# Patient Record
Sex: Female | Born: 1995
Health system: Southern US, Community
[De-identification: ages and names within clinical notes are randomized; demographics above are authoritative.]

## PROBLEM LIST (undated history)

## (undated) ENCOUNTER — Ambulatory Visit (HOSPITAL_COMMUNITY): Disposition: A | Discharge status: Home / Self Care | Payer: Medicaid Other

## (undated) ENCOUNTER — Inpatient Hospital Stay (HOSPITAL_COMMUNITY): Payer: Self-pay

## (undated) DIAGNOSIS — E282 Polycystic ovarian syndrome: Secondary | ICD-10-CM

## (undated) DIAGNOSIS — G43909 Migraine, unspecified, not intractable, without status migrainosus: Secondary | ICD-10-CM

## (undated) DIAGNOSIS — D573 Sickle-cell trait: Secondary | ICD-10-CM

## (undated) DIAGNOSIS — R569 Unspecified convulsions: Secondary | ICD-10-CM

## (undated) HISTORY — PX: TONSILLECTOMY: SUR1361

## (undated) HISTORY — DX: Migraine, unspecified, not intractable, without status migrainosus: G43.909

---
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
## 2017-01-04 ENCOUNTER — Telehealth: Payer: Self-pay

## 2017-01-04 NOTE — Telephone Encounter (Signed)
Returned call and pt stated that she would like to come in for a blood draw pregnancy test. Advised that we would do a urine test but pt states that she took one at home that was negative and she has a history of PCOS and is currently on nexplanon. Asked pt why she thinks she may be pregnant, she states she has been very fatigue, nauseous, and crampy and would like to see a provider, transferred to scheduler.

## 2017-01-06 DIAGNOSIS — R110 Nausea: Secondary | ICD-10-CM | POA: Diagnosis not present

## 2017-01-06 DIAGNOSIS — N644 Mastodynia: Secondary | ICD-10-CM | POA: Diagnosis not present

## 2017-01-06 DIAGNOSIS — N911 Secondary amenorrhea: Secondary | ICD-10-CM | POA: Diagnosis not present

## 2017-01-06 DIAGNOSIS — Z79899 Other long term (current) drug therapy: Secondary | ICD-10-CM | POA: Diagnosis not present

## 2017-01-09 ENCOUNTER — Ambulatory Visit: Payer: Medicaid Other | Admitting: Certified Nurse Midwife

## 2017-04-02 ENCOUNTER — Encounter: Payer: Self-pay | Admitting: Obstetrics

## 2017-04-02 ENCOUNTER — Other Ambulatory Visit (HOSPITAL_COMMUNITY)
Admission: RE | Admit: 2017-04-02 | Discharge: 2017-04-02 | Disposition: A | Discharge status: Home / Self Care | Payer: Medicaid Other | Source: Ambulatory Visit | Attending: Certified Nurse Midwife | Admitting: Certified Nurse Midwife

## 2017-04-02 ENCOUNTER — Ambulatory Visit (INDEPENDENT_AMBULATORY_CARE_PROVIDER_SITE_OTHER): Payer: Medicaid Other | Admitting: Certified Nurse Midwife

## 2017-04-02 VITALS — BP 116/76 | HR 99 | Ht 60.0 in | Wt 154.2 lb

## 2017-04-02 DIAGNOSIS — Z978 Presence of other specified devices: Secondary | ICD-10-CM | POA: Diagnosis not present

## 2017-04-02 DIAGNOSIS — N946 Dysmenorrhea, unspecified: Secondary | ICD-10-CM

## 2017-04-02 DIAGNOSIS — E282 Polycystic ovarian syndrome: Secondary | ICD-10-CM | POA: Insufficient documentation

## 2017-04-02 DIAGNOSIS — N921 Excessive and frequent menstruation with irregular cycle: Secondary | ICD-10-CM

## 2017-04-02 DIAGNOSIS — Z113 Encounter for screening for infections with a predominantly sexual mode of transmission: Secondary | ICD-10-CM

## 2017-04-02 DIAGNOSIS — Z975 Presence of (intrauterine) contraceptive device: Secondary | ICD-10-CM

## 2017-04-02 DIAGNOSIS — B373 Candidiasis of vulva and vagina: Secondary | ICD-10-CM

## 2017-04-02 DIAGNOSIS — K2960 Other gastritis without bleeding: Secondary | ICD-10-CM | POA: Diagnosis not present

## 2017-04-02 DIAGNOSIS — B3731 Acute candidiasis of vulva and vagina: Secondary | ICD-10-CM

## 2017-04-02 MED ORDER — OMEPRAZOLE 20 MG PO CPDR: 20.0000 mg | DELAYED_RELEASE_CAPSULE | Freq: Two times a day (BID) | ORAL | 5 refills | Status: DC

## 2017-04-02 MED ORDER — PRENATE PIXIE 10-0.6-0.4-200 MG PO CAPS: 1.0000 | ORAL_CAPSULE | Freq: Every day | ORAL | 12 refills | Status: DC

## 2017-04-02 MED ORDER — FLUCONAZOLE 200 MG PO TABS: 200.0000 mg | ORAL_TABLET | Freq: Once | ORAL | 0 refills | Status: AC

## 2017-04-02 NOTE — Progress Notes (Signed)
Patient is in the office for follow up, complains of painful intercourse with a history of PCOS. Pt is currently on nexplanon, LMP 03-07-17.

## 2017-04-03 ENCOUNTER — Other Ambulatory Visit: Payer: Self-pay | Admitting: Certified Nurse Midwife

## 2017-04-03 ENCOUNTER — Encounter: Payer: Self-pay | Admitting: Gastroenterology

## 2017-04-03 ENCOUNTER — Encounter: Payer: Self-pay | Admitting: Certified Nurse Midwife

## 2017-04-03 LAB — CBC WITH DIFFERENTIAL/PLATELET
BASOS: 1 %
Basophils Absolute: 0 10*3/uL (ref 0.0–0.2)
EOS (ABSOLUTE): 0.1 10*3/uL (ref 0.0–0.4)
EOS: 1 %
HEMATOCRIT: 36.7 % (ref 34.0–46.6)
Hemoglobin: 11.7 g/dL (ref 11.1–15.9)
IMMATURE GRANS (ABS): 0 10*3/uL (ref 0.0–0.1)
Immature Granulocytes: 0 %
LYMPHS: 30 %
Lymphocytes Absolute: 2.1 10*3/uL (ref 0.7–3.1)
MCH: 21.7 pg — AB (ref 26.6–33.0)
MCHC: 31.9 g/dL (ref 31.5–35.7)
MCV: 68 fL — AB (ref 79–97)
MONOS ABS: 0.5 10*3/uL (ref 0.1–0.9)
Monocytes: 6 %
NEUTROS ABS: 4.4 10*3/uL (ref 1.4–7.0)
Neutrophils: 62 %
PLATELETS: 221 10*3/uL (ref 150–379)
RBC: 5.39 x10E6/uL — ABNORMAL HIGH (ref 3.77–5.28)
RDW: 16.6 % — ABNORMAL HIGH (ref 12.3–15.4)
WBC: 7.1 10*3/uL (ref 3.4–10.8)

## 2017-04-03 LAB — COMPREHENSIVE METABOLIC PANEL
ALT: 7 IU/L (ref 0–32)
AST: 13 IU/L (ref 0–40)
Albumin/Globulin Ratio: 1.6 (ref 1.2–2.2)
Albumin: 4.3 g/dL (ref 3.5–5.5)
Alkaline Phosphatase: 65 IU/L (ref 39–117)
BILIRUBIN TOTAL: 0.3 mg/dL (ref 0.0–1.2)
BUN / CREAT RATIO: 15 (ref 9–23)
BUN: 11 mg/dL (ref 6–20)
CALCIUM: 9.6 mg/dL (ref 8.7–10.2)
CO2: 23 mmol/L (ref 20–29)
Chloride: 101 mmol/L (ref 96–106)
Creatinine, Ser: 0.73 mg/dL (ref 0.57–1.00)
GFR calc Af Amer: 138 mL/min/{1.73_m2} (ref >59)
GFR, EST NON AFRICAN AMERICAN: 120 mL/min/{1.73_m2} (ref >59)
GLOBULIN, TOTAL: 2.7 g/dL (ref 1.5–4.5)
Glucose: 90 mg/dL (ref 65–99)
Potassium: 4.5 mmol/L (ref 3.5–5.2)
SODIUM: 137 mmol/L (ref 134–144)
TOTAL PROTEIN: 7 g/dL (ref 6.0–8.5)

## 2017-04-03 LAB — HIV ANTIBODY (ROUTINE TESTING W REFLEX): HIV SCREEN 4TH GENERATION: NONREACTIVE

## 2017-04-03 LAB — HEPATITIS C ANTIBODY: Hep C Virus Ab: <0.1 s/co ratio (ref 0.0–0.9)

## 2017-04-03 LAB — CERVICOVAGINAL ANCILLARY ONLY
Bacterial vaginitis: NEGATIVE
CANDIDA VAGINITIS: POSITIVE — AB
CHLAMYDIA, DNA PROBE: NEGATIVE
NEISSERIA GONORRHEA: NEGATIVE
TRICH (WINDOWPATH): NEGATIVE

## 2017-04-03 LAB — HEMOGLOBIN A1C
Est. average glucose Bld gHb Est-mCnc: 97 mg/dL
Hgb A1c MFr Bld: 5 % (ref 4.8–5.6)

## 2017-04-03 LAB — HEPATITIS B SURFACE ANTIGEN: Hepatitis B Surface Ag: NEGATIVE

## 2017-04-03 LAB — RPR: RPR Ser Ql: NONREACTIVE

## 2017-04-03 LAB — TSH: TSH: 1.16 u[IU]/mL (ref 0.450–4.500)

## 2017-04-03 NOTE — Progress Notes (Signed)
Patient ID: Bailey Rose, female   DOB: 1995-11-28, 21 y.o.   MRN: [id number]  Chief Complaint  Patient presents with  . GYN    HPI Bailey Rose is a 21 y.o. female.  Here for f/u on PCOS, had annual exam on 10/02/16.  States that she is having pain with sexual intercourse.  Has Nexplanon that was placed: 03/23/15.  Has a new sexual partner this year.  Desires STD screening examination.  Reports change in vaginal discharge.  Denies any change in medications or activity.  Six months ago her labs were normal, STD blood work was not done.  Will do that today per patient request.  She did have BV and was anemic six months ago.  Reports pain with penetration during sexual intercourse, bimanual exam was negative.  Discussed changing positions and using lubrication along with foreplay.  She also states that she has had a change in her bowel movements/reflux symptoms that started recently.  Has not tried anything for this problem and has not seen anyone for this problem, is requesting a GI consult.  Negative hx for cancers in the family.  Denies drinking/smoking.  Reports irregular periods with Nexplanon, does not want to change birth control methods.  Discussed maybe using Nuva Ring if other methods are not working for pain with sexual intercourse or if labs are negative. Does report dysmenorrhea and occasional cramping still with Nexplanon, has not changed since she started having periods.   HPI  Past Medical History:  Diagnosis Date  . Asthma   . Migraines   . Sickle cell trait (HCC)   . Sickle cell trait [facility])     Past Surgical History:  Procedure Laterality Date  . TONSILLECTOMY    . WISDOM TOOTH EXTRACTION  2017    Family History  Problem Relation Age of Onset  . Sickle cell anemia Mother   . Cancer Maternal Grandmother   . Migraines Maternal Grandmother     Social History Social History  Substance Use Topics  . Smoking status: Never Smoker  . Smokeless tobacco: Never Used  .  Alcohol use No    No Known Allergies  Current Outpatient Prescriptions  Medication Sig Dispense Refill  . omeprazole (PRILOSEC) 20 MG capsule Take 1 capsule (20 mg total) by mouth 2 (two) times daily before a meal. 60 capsule 5  . Prenat-FeAsp-Meth-FA-DHA w/o A (PRENATE PIXIE) 10-0.6-0.4-200 MG CAPS Take 1 tablet by mouth daily. 30 capsule 12   No current facility-administered medications for this visit.     Review of Systems Review of Systems Constitutional: negative for fatigue and weight loss Respiratory: negative for cough and wheezing Cardiovascular: negative for chest pain, fatigue and palpitations Gastrointestinal: negative for abdominal pain and + in bowel habits, +reflux Genitourinary: +pain with sexual intercourse, Hx of PCOS Integument/breast: negative for nipple discharge Musculoskeletal:negative for myalgias Neurological: negative for gait problems and tremors Behavioral/Psych: negative for abusive relationship, depression Endocrine: negative for temperature intolerance      Blood pressure 116/76, pulse 99, height 5' (1.524 m), weight 154 lb 3.2 oz (69.9 kg), last menstrual period 03/07/2017.  Physical Exam Physical Exam General:   alert  Skin:   no rash or abnormalities  Lungs:   clear to auscultation bilaterally  Heart:   regular rate and rhythm, S1, S2 normal, no murmur, click, rub or gallop  Breasts:   deferred  Abdomen:  normal findings: no organomegaly, soft, non-tender and no hernia  Pelvis:  External genitalia: normal general appearance Urinary system:  urethral meatus normal and bladder without fullness, nontender Vaginal: normal without tenderness, induration or masses, + white chunky vaginal discharge Cervix: no CMT Adnexa: normal bimanual exam Uterus: anteverted and non-tender, normal size    50% of 20 min visit spent on counseling and coordination of care.    Data Reviewed Previous medical hx, meds, labs  Assessment     Hx of PCOS Pain with  sexual intercourse: new problem Reflux: GI symptoms new problem STD screening exam; high risk sexual behaviors Hx of Anemia: started on vitamins Vaginitis    Plan    Orders Placed This Encounter  Procedures  . CBC with Differential/Platelet  . Comprehensive metabolic panel  . TSH  . Hemoglobin A1c  . HIV antibody (with reflex)  . RPR  . Hepatitis C antibody  . Hepatitis B surface antigen  . Ambulatory referral to Gastroenterology    Referral Priority:   Routine    Referral Type:   Consultation    Referral Reason:   Specialty Services Required    Number of Visits Requested:   1   Meds ordered this encounter  Medications  . Prenat-FeAsp-Meth-FA-DHA w/o A (PRENATE PIXIE) 10-0.6-0.4-200 MG CAPS    Sig: Take 1 tablet by mouth daily.    Dispense:  30 capsule    Refill:  12    Please process coupon: Rx BIN: [id number], RxPCN: [id number], RxGRP: [id number], RxID: [id number]  SUF: 01  . omeprazole (PRILOSEC) 20 MG capsule    Sig: Take 1 capsule (20 mg total) by mouth 2 (two) times daily before a meal.    Dispense:  60 capsule    Refill:  5  . fluconazole (DIFLUCAN) 200 MG tablet    Sig: Take 1 tablet (200 mg total) by mouth once. Repeat dose in 48-72 hours.    Dispense:  3 tablet    Refill:  0    Follow up as needed.

## 2017-04-04 ENCOUNTER — Other Ambulatory Visit: Payer: Self-pay | Admitting: Certified Nurse Midwife

## 2017-04-04 ENCOUNTER — Encounter: Payer: Self-pay | Admitting: *Deleted

## 2017-04-04 DIAGNOSIS — B3731 Acute candidiasis of vulva and vagina: Secondary | ICD-10-CM

## 2017-04-04 DIAGNOSIS — B373 Candidiasis of vulva and vagina: Secondary | ICD-10-CM

## 2017-04-04 MED ORDER — TERCONAZOLE 0.8 % VA CREA: 1.0000 | TOPICAL_CREAM | Freq: Every day | VAGINAL | 0 refills | Status: DC

## 2017-04-04 MED ORDER — FLUCONAZOLE 200 MG PO TABS: 200.0000 mg | ORAL_TABLET | Freq: Once | ORAL | 0 refills | Status: AC

## 2017-05-09 ENCOUNTER — Ambulatory Visit (INDEPENDENT_AMBULATORY_CARE_PROVIDER_SITE_OTHER): Payer: Self-pay | Admitting: Specialist

## 2017-05-14 ENCOUNTER — Ambulatory Visit (INDEPENDENT_AMBULATORY_CARE_PROVIDER_SITE_OTHER): Payer: Self-pay | Admitting: Specialist

## 2017-05-15 ENCOUNTER — Ambulatory Visit (INDEPENDENT_AMBULATORY_CARE_PROVIDER_SITE_OTHER): Payer: Self-pay | Admitting: Specialist

## 2017-05-24 ENCOUNTER — Ambulatory Visit: Payer: Medicaid Other | Admitting: Gastroenterology

## 2017-05-26 ENCOUNTER — Inpatient Hospital Stay (HOSPITAL_COMMUNITY)
Admission: AD | Admit: 2017-05-26 | Discharge: 2017-05-26 | Disposition: A | Discharge status: Home / Self Care | Payer: Medicaid Other | Source: Ambulatory Visit | Attending: Obstetrics & Gynecology | Admitting: Obstetrics & Gynecology

## 2017-05-26 DIAGNOSIS — Z711 Person with feared health complaint in whom no diagnosis is made: Secondary | ICD-10-CM

## 2017-05-26 NOTE — MAU Provider Note (Signed)
S; Pt in demanding her nexplanon be removed. States messing with her body. But could not give explanation as to how. States had it in for two years. O: vss, alert and oriented x 3, no acute distress A; stable 21 yo female in no acute distress P: lengthy discussion with pt that she would need provider that put in her nexplanon to remove it. Explained to her  That this was not a emergency and we tipically did not remove them in the ER. She verbalized understanding and was d/c home

## 2017-05-26 NOTE — MAU Note (Signed)
Pt reports to MAU stating that she wants her nexplanon removed, D Lawson CNM in discussing plan of care with pt

## 2017-05-28 ENCOUNTER — Telehealth: Payer: Self-pay

## 2017-05-28 NOTE — Telephone Encounter (Signed)
Pt called on Friday and left vm. Returned pt call this am. LM to call back

## 2017-06-13 ENCOUNTER — Ambulatory Visit: Payer: Medicaid Other | Admitting: Certified Nurse Midwife

## 2017-06-22 ENCOUNTER — Ambulatory Visit (INDEPENDENT_AMBULATORY_CARE_PROVIDER_SITE_OTHER): Payer: Self-pay | Admitting: Specialist

## 2017-07-04 ENCOUNTER — Ambulatory Visit (INDEPENDENT_AMBULATORY_CARE_PROVIDER_SITE_OTHER): Payer: Medicaid Other | Admitting: Certified Nurse Midwife

## 2017-07-04 ENCOUNTER — Other Ambulatory Visit (HOSPITAL_COMMUNITY)
Admission: RE | Admit: 2017-07-04 | Discharge: 2017-07-04 | Disposition: A | Discharge status: Home / Self Care | Payer: Medicaid Other | Source: Ambulatory Visit | Attending: Certified Nurse Midwife | Admitting: Certified Nurse Midwife

## 2017-07-04 ENCOUNTER — Encounter: Payer: Self-pay | Admitting: Certified Nurse Midwife

## 2017-07-04 VITALS — BP 102/66 | HR 91 | Ht 61.0 in | Wt 156.0 lb

## 2017-07-04 DIAGNOSIS — Z01419 Encounter for gynecological examination (general) (routine) without abnormal findings: Secondary | ICD-10-CM | POA: Diagnosis not present

## 2017-07-04 DIAGNOSIS — N921 Excessive and frequent menstruation with irregular cycle: Secondary | ICD-10-CM | POA: Insufficient documentation

## 2017-07-04 DIAGNOSIS — Z113 Encounter for screening for infections with a predominantly sexual mode of transmission: Secondary | ICD-10-CM

## 2017-07-04 DIAGNOSIS — E282 Polycystic ovarian syndrome: Secondary | ICD-10-CM

## 2017-07-04 DIAGNOSIS — Z9689 Presence of other specified functional implants: Secondary | ICD-10-CM | POA: Insufficient documentation

## 2017-07-04 DIAGNOSIS — Z0000 Encounter for general adult medical examination without abnormal findings: Secondary | ICD-10-CM

## 2017-07-04 DIAGNOSIS — Z975 Presence of (intrauterine) contraceptive device: Secondary | ICD-10-CM

## 2017-07-04 MED ORDER — NORELGESTROMIN-ETH ESTRADIOL 150-35 MCG/24HR TD PTWK: 1.0000 | MEDICATED_PATCH | TRANSDERMAL | 12 refills | Status: DC

## 2017-07-04 NOTE — Progress Notes (Signed)
Presents for AUB with Nexplanon.

## 2017-07-04 NOTE — Progress Notes (Signed)
Subjective:        Bailey Rose is a 21 y.o. female here for a routine exam.  Current complaints: irregular bleeding with Nexplanon.   Had Nexplanon that was placed: 03/23/15.  Has a new sexual partner this year.  Desires STD screening examination.  Reports change in vaginal discharge.  Denies any change in medications or activity.  Six months ago her labs were normal, STD blood work was not done.  Will do that today per patient request.  She did have BV and was anemic six months ago.  Reports pain with penetration during sexual intercourse, bimanual exam was negative.  Discussed changing positions and using lubrication along with foreplay.  She also states that she has had a change in her bowel movements/reflux symptoms that started recently.  Has not tried anything for this problem and has not seen anyone for this problem, is requesting a GI consult.  Negative hx for cancers in the family.  Denies drinking/smoking.  Reports irregular periods with Nexplanon, does not want to change birth control methods.  Discussed maybe using Nuva Ring if other methods are not working for pain with sexual intercourse or if labs are negative. Does report dysmenorrhea and occasional cramping still with Nexplanon, has not changed since she started having periods. Reports bleeding with Nexplanon several times per month.    Personal health questionnaire:  Is patient [name], have a family history of breast and/or ovarian cancer: no Is there a family history of uterine cancer diagnosed at age < 6950, gastrointestinal cancer, urinary tract cancer, family member who is a Personnel officerLynch syndrome-associated carrier: no Is the patient overweight and hypertensive, family history of diabetes, personal history of gestational diabetes, preeclampsia or PCOS: no Is patient over 5655, have PCOS,  family history of premature CHD under age 19, diabetes, smoke, have hypertension or peripheral artery disease:  no At any time, has a partner  hit, kicked or otherwise hurt or frightened you?: no Over the past 2 weeks, have you felt down, depressed or hopeless?: no Over the past 2 weeks, have you felt little interest or pleasure in doing things?:no   Gynecologic History No LMP recorded. Patient has had an implant. Contraception: Nexplanon Last Pap: n/a <21.  Last mammogram: n/a <40.   Obstetric History OB History  Gravida Para Term Preterm AB Living  0 0 0 0 0 0  SAB TAB Ectopic Multiple Live Births  0 0 0 0 0        Past Medical History:  Diagnosis Date  . Asthma   . Migraines   . Sickle cell trait (HCC)   . Sickle cell trait [facility](HCC)     Past Surgical History:  Procedure Laterality Date  . TONSILLECTOMY    . WISDOM TOOTH EXTRACTION  2017     Current Outpatient Medications:  .  etonogestrel (NEXPLANON) 68 MG IMPL implant, 1 each by Subdermal route once., Disp: , Rfl:  .  omeprazole (PRILOSEC) 20 MG capsule, Take 1 capsule (20 mg total) by mouth 2 (two) times daily before a meal. (Patient not taking: Reported on 07/04/2017), Disp: 60 capsule, Rfl: 5 .  Prenat-FeAsp-Meth-FA-DHA w/o A (PRENATE PIXIE) 10-0.6-0.4-200 MG CAPS, Take 1 tablet by mouth daily. (Patient not taking: Reported on 07/04/2017), Disp: 30 capsule, Rfl: 12 .  terconazole (TERAZOL 3) 0.8 % vaginal cream, Place 1 applicator vaginally at bedtime. (Patient not taking: Reported on 07/04/2017), Disp: 20 g, Rfl: 0 No Known Allergies  Social History   Tobacco Use  .  Smoking status: Never Smoker  . Smokeless tobacco: Never Used  Substance Use Topics  . Alcohol use: No    Alcohol/week: 0.0 oz    Family History  Problem Relation Age of Onset  . Sickle cell anemia Mother   . Cancer Maternal Grandmother   . Migraines Maternal Grandmother       Review of Systems  Constitutional: negative for fatigue and weight loss Respiratory: negative for cough and wheezing Cardiovascular: negative for chest pain, fatigue and palpitations Gastrointestinal:  negative for abdominal pain and change in bowel habits Musculoskeletal:negative for myalgias Neurological: negative for gait problems and tremors Behavioral/Psych: negative for abusive relationship, depression Endocrine: negative for temperature intolerance    Genitourinary:negative for abnormal menstrual periods, genital lesions, hot flashes, sexual problems and vaginal discharge Integument/breast: negative for breast lump, breast tenderness, nipple discharge and skin lesion(s)    Objective:       BP 102/66   Pulse 91   Ht 5\' 1"  (1.549 m)   Wt 156 lb (70.8 kg)   BMI 29.48 kg/m  General:   alert  Skin:   no rash or abnormalities  Lungs:   clear to auscultation bilaterally  Heart:   regular rate and rhythm, S1, S2 normal, no murmur, click, rub or gallop  Breasts:   normal without suspicious masses, skin or nipple changes or axillary nodes  Abdomen:  normal findings: no organomegaly, soft, non-tender and no hernia  Pelvis:  External genitalia: normal general appearance Urinary system: urethral meatus normal and bladder without fullness, nontender Vaginal: normal without tenderness, induration or masses Cervix: normal appearance Adnexa: normal bimanual exam Uterus: anteverted and non-tender, normal size   Lab Review Urine pregnancy test Labs reviewed yes Radiologic studies reviewed no  50% of 30 min visit spent on counseling and coordination of care.   Assessment & Plan    Healthy female exam.    1. Screen for STD (sexually transmitted disease)    - RPR - Hepatitis C antibody - Hepatitis B surface antigen - HIV antibody - Cervicovaginal ancillary only  2. Breakthrough bleeding on Nexplanon    - CBC with Differential/Platelet - Comprehensive metabolic panel - TSH - Cervicovaginal ancillary only  3. Well woman exam     4. PCOS (polycystic ovarian syndrome)    - Hemoglobin A1c - norelgestromin-ethinyl estradiol (XULANE) 150-35 MCG/24HR transdermal patch; Place  1 patch onto the skin once a week.  Dispense: 3 patch; Refill: 12   Education reviewed: calcium supplements, depression evaluation, low fat, low cholesterol diet, safe sex/STD prevention, self breast exams, skin cancer screening and weight bearing exercise. Contraception: Ortho-Evra patches weekly and Nexplanon. Follow up in: 2 weeks for Nexplanon removal.

## 2017-07-04 NOTE — Progress Notes (Signed)
Pt states that she has been having breakthrough bleeding 2-3 times a month, and has had nexplanon since 03-23-15. Pt states that she wants to discuss options with provider.

## 2017-07-05 LAB — CERVICOVAGINAL ANCILLARY ONLY
Bacterial vaginitis: NEGATIVE
CANDIDA VAGINITIS: NEGATIVE
Chlamydia: NEGATIVE
Neisseria Gonorrhea: NEGATIVE
TRICH (WINDOWPATH): NEGATIVE

## 2017-07-05 LAB — COMPREHENSIVE METABOLIC PANEL
A/G RATIO: 1.8 (ref 1.2–2.2)
ALT: 7 IU/L (ref 0–32)
AST: 11 IU/L (ref 0–40)
Albumin: 4.6 g/dL (ref 3.5–5.5)
Alkaline Phosphatase: 62 IU/L (ref 39–117)
BILIRUBIN TOTAL: 0.3 mg/dL (ref 0.0–1.2)
BUN / CREAT RATIO: 18 (ref 9–23)
BUN: 13 mg/dL (ref 6–20)
CHLORIDE: 104 mmol/L (ref 96–106)
CO2: 23 mmol/L (ref 20–29)
Calcium: 9.6 mg/dL (ref 8.7–10.2)
Creatinine, Ser: 0.74 mg/dL (ref 0.57–1.00)
GFR, EST AFRICAN AMERICAN: 136 mL/min/{1.73_m2} (ref >59)
GFR, EST NON AFRICAN AMERICAN: 118 mL/min/{1.73_m2} (ref >59)
GLOBULIN, TOTAL: 2.6 g/dL (ref 1.5–4.5)
Glucose: 75 mg/dL (ref 65–99)
POTASSIUM: 4.3 mmol/L (ref 3.5–5.2)
SODIUM: 141 mmol/L (ref 134–144)
TOTAL PROTEIN: 7.2 g/dL (ref 6.0–8.5)

## 2017-07-05 LAB — HEMOGLOBIN A1C
ESTIMATED AVERAGE GLUCOSE: 97 mg/dL
HEMOGLOBIN A1C: 5 % (ref 4.8–5.6)

## 2017-07-05 LAB — CBC WITH DIFFERENTIAL/PLATELET
BASOS: 1 %
Basophils Absolute: 0.1 10*3/uL (ref 0.0–0.2)
EOS (ABSOLUTE): 0.2 10*3/uL (ref 0.0–0.4)
EOS: 2 %
HEMATOCRIT: 38.2 % (ref 34.0–46.6)
Hemoglobin: 12.6 g/dL (ref 11.1–15.9)
IMMATURE GRANS (ABS): 0 10*3/uL (ref 0.0–0.1)
Immature Granulocytes: 0 %
Lymphocytes Absolute: 2.7 10*3/uL (ref 0.7–3.1)
Lymphs: 36 %
MCH: 23.1 pg — AB (ref 26.6–33.0)
MCHC: 33 g/dL (ref 31.5–35.7)
MCV: 70 fL — AB (ref 79–97)
MONOS ABS: 0.5 10*3/uL (ref 0.1–0.9)
Monocytes: 7 %
NEUTROS ABS: 4.1 10*3/uL (ref 1.4–7.0)
Neutrophils: 54 %
PLATELETS: 248 10*3/uL (ref 150–379)
RBC: 5.46 x10E6/uL — ABNORMAL HIGH (ref 3.77–5.28)
RDW: 16.9 % — ABNORMAL HIGH (ref 12.3–15.4)
WBC: 7.6 10*3/uL (ref 3.4–10.8)

## 2017-07-05 LAB — HEPATITIS C ANTIBODY

## 2017-07-05 LAB — TSH: TSH: 0.648 u[IU]/mL (ref 0.450–4.500)

## 2017-07-05 LAB — RPR: RPR Ser Ql: NONREACTIVE

## 2017-07-05 LAB — HEPATITIS B SURFACE ANTIGEN: HEP B S AG: NEGATIVE

## 2017-07-05 LAB — HIV ANTIBODY (ROUTINE TESTING W REFLEX): HIV Screen 4th Generation wRfx: NONREACTIVE

## 2017-07-18 ENCOUNTER — Telehealth: Payer: Self-pay

## 2017-07-18 NOTE — Telephone Encounter (Signed)
Returned call and pt had questions about labs and iron levels.

## 2017-07-19 ENCOUNTER — Ambulatory Visit: Payer: Medicaid Other | Admitting: Gastroenterology

## 2017-07-24 ENCOUNTER — Encounter: Payer: Self-pay | Admitting: Certified Nurse Midwife

## 2017-07-24 ENCOUNTER — Ambulatory Visit (INDEPENDENT_AMBULATORY_CARE_PROVIDER_SITE_OTHER): Payer: Medicaid Other | Admitting: Certified Nurse Midwife

## 2017-07-24 VITALS — BP 113/69 | HR 74 | Wt 153.0 lb

## 2017-07-24 DIAGNOSIS — Z3049 Encounter for surveillance of other contraceptives: Secondary | ICD-10-CM | POA: Diagnosis not present

## 2017-07-24 DIAGNOSIS — Z3046 Encounter for surveillance of implantable subdermal contraceptive: Secondary | ICD-10-CM

## 2017-07-24 NOTE — Progress Notes (Signed)
Pt here for a Nexplanon Removal

## 2017-07-24 NOTE — Progress Notes (Signed)
Procedure Note Removal of Nexplanon  Patient had Nexplanon inserted in 03/23/15. Desires removal today due to irregular periods, sometimes twice monthly with heavy bleeding reported.   Reviewed risk and benefits of procedure. Alternative options discussed Patient reported understanding and agreed to continue.   The patient's left arm was palpated and the implant device located. The area was prepped with Betadinex3. The distal end of the device was palpated and 1 cc of 1% lidocaine with epinephrine was injected. A 2 mm incision was made. Any fibrotic tissue was carefully dissected away using blunt and/or sharp dissection. Over the tip and the tip was exposed, grasped with forcep and removed intact. Steri-strips and a sterile dressing were applied to the incision.   And a bandage applied and the arm was wrapped with gauze bandage.  The patient tolerated well.  Instructions:  The patient was instructed to remove the dressing in 24 hours and that some bruising is to be expected.  She was advised to use over the counter analgesics as needed for any pain at the site.  She is to keep the area dry for 24 hours and to call if her hand or arm becomes cold, numb, or blue.  Return visit:  Return in 6+ weeks Patient plans Ortho-Evra Patches & abstinence.   Bailey Rose CNM

## 2017-07-25 ENCOUNTER — Ambulatory Visit (HOSPITAL_COMMUNITY)
Admission: EM | Admit: 2017-07-25 | Discharge: 2017-07-25 | Disposition: A | Discharge status: Home / Self Care | Payer: Medicaid Other | Attending: Urgent Care | Admitting: Urgent Care

## 2017-07-25 ENCOUNTER — Other Ambulatory Visit: Payer: Self-pay

## 2017-07-25 ENCOUNTER — Encounter (HOSPITAL_COMMUNITY): Payer: Self-pay | Admitting: Emergency Medicine

## 2017-07-25 DIAGNOSIS — R0602 Shortness of breath: Secondary | ICD-10-CM

## 2017-07-25 DIAGNOSIS — R0789 Other chest pain: Secondary | ICD-10-CM

## 2017-07-25 MED ORDER — FLUTICASONE PROPIONATE 50 MCG/ACT NA SUSP: 2.0000 | Freq: Every day | NASAL | 11 refills | Status: DC

## 2017-07-25 MED ORDER — ALBUTEROL SULFATE HFA 108 (90 BASE) MCG/ACT IN AERS: 1.0000 | INHALATION_SPRAY | RESPIRATORY_TRACT | 0 refills | Status: DC | PRN

## 2017-07-25 MED ORDER — CETIRIZINE HCL 10 MG PO TABS: 10.0000 mg | ORAL_TABLET | Freq: Every day | ORAL | 11 refills | Status: DC

## 2017-07-25 NOTE — ED Triage Notes (Signed)
Pt states she has mold in her apartment complex

## 2017-07-25 NOTE — ED Provider Notes (Signed)
  MRN: [id number] DOB: 02/21/1996  Subjective:   Bailey Rose is a 22 y.o. female presenting for 4 day history of intermittent shortness of breath daily. Also has chest tightness, chest pain, mild occasional dry cough. She recently quit smoking cigars as recommended by her PCP. However, there is a mold issue in her apartment complex. Denies fever, sore throat, wheezing, n/v, abdominal pain, rashes, dizziness, confusion.    Bailey Rose has No Known Allergies.  Bailey Rose  has a past medical history of Asthma, Migraines, Sickle cell trait (HCC), and Sickle cell trait (HCC). Also  has a past surgical history that includes Tonsillectomy and Wisdom tooth extraction (2017). Her family history includes Cancer in her maternal grandmother; Migraines in her maternal grandmother; Sickle cell anemia in her mother.   Objective:   Vitals: BP 122/69 (BP Location: Right Arm)   Pulse 87   Temp 98.4 F (36.9 C) (Oral)   Resp 16   LMP 07/16/2017   SpO2 100%   Physical Exam  Constitutional: She is oriented to person, place, and time. She appears well-developed and well-nourished.  HENT:  Mouth/Throat: Oropharynx is clear and moist.  Cardiovascular: Normal rate, regular rhythm and intact distal pulses. Exam reveals no gallop and no friction rub.  No murmur heard. Pulmonary/Chest: No respiratory distress. She has no wheezes. She has no rales.  Neurological: She is alert and oriented to person, place, and time.  Skin: Skin is warm and dry.  Psychiatric: She has a normal mood and affect.   Assessment and Plan :   Shortness of breath  Chest tightness  Atypical chest pain   Patient has a history of asthma, exposure to mold is likely eliciting shob, chest symptoms. Recommended patient start albuterol inhaler, zyrtec and Flonase. Return-to-clinic precautions discussed, patient verbalized understanding. Otherwise, follow up with PCP.   Wallis BambergMani, Vance Belcourt, PA-C 07/25/17 1038

## 2017-07-25 NOTE — ED Triage Notes (Signed)
Pt c/o trouble breathing, pressure on her chest for the last few days. Resp e/u. Pt smokes cigars, her PCP told her if nothing got better after quitting smoking to come here.

## 2017-07-28 ENCOUNTER — Encounter (HOSPITAL_COMMUNITY): Payer: Self-pay

## 2017-07-28 ENCOUNTER — Other Ambulatory Visit: Payer: Self-pay

## 2017-07-28 ENCOUNTER — Emergency Department (HOSPITAL_COMMUNITY)
Admission: EM | Admit: 2017-07-28 | Discharge: 2017-07-28 | Disposition: A | Discharge status: Home / Self Care | Payer: Medicaid Other | Attending: Emergency Medicine | Admitting: Emergency Medicine

## 2017-07-28 DIAGNOSIS — M545 Low back pain, unspecified: Secondary | ICD-10-CM

## 2017-07-28 DIAGNOSIS — J45909 Unspecified asthma, uncomplicated: Secondary | ICD-10-CM | POA: Diagnosis not present

## 2017-07-28 DIAGNOSIS — Z79899 Other long term (current) drug therapy: Secondary | ICD-10-CM | POA: Insufficient documentation

## 2017-07-28 DIAGNOSIS — D573 Sickle-cell trait: Secondary | ICD-10-CM | POA: Diagnosis not present

## 2017-07-28 MED ORDER — LIDOCAINE 5 % EX PTCH: 1.0000 | MEDICATED_PATCH | CUTANEOUS | 0 refills | Status: DC

## 2017-07-28 MED ORDER — METHOCARBAMOL 500 MG PO TABS: 500.0000 mg | ORAL_TABLET | Freq: Three times a day (TID) | ORAL | 0 refills | Status: DC | PRN

## 2017-07-28 MED ORDER — NAPROXEN 250 MG PO TABS
500.0000 mg | ORAL_TABLET | Freq: Once | ORAL | Status: AC
  Administered 2017-07-28: 500 mg via ORAL
  Filled 2017-07-28: qty 2

## 2017-07-28 MED ORDER — NAPROXEN 500 MG PO TABS: 500.0000 mg | ORAL_TABLET | Freq: Two times a day (BID) | ORAL | 0 refills | Status: DC

## 2017-07-28 NOTE — ED Triage Notes (Signed)
Onset 4 days back pain, h/o of back pain that started over 1 year ago while in [employer].

## 2017-07-28 NOTE — ED Provider Notes (Signed)
[facility] EMERGENCY DEPARTMENT Provider Note   CSN: [id number] Arrival date & time: 07/28/17  2130     History   Chief Complaint Chief Complaint  Patient presents with  . Back Pain    HPI Bailey Rose is a 22 y.o. female with a hx of asthma and migraines presents to the ED with complaint of acute on chronic lower back pain for the past 4 days. 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 States that she has been doing a lot of standing at work which she believes worsened the pain, no specific injury, fall, or heavy lifting. Describes the pain as being an aching/throbbing pain to the diffuse lower back. States pain sometimes feels like a muscle spasm. Rates pain a 7.5/10 in severity. States pain is better in the fetal position, worse with movement and standing. Pain is non radiating. Has tried ibuprofen and tylenol without significant relief. States that muscle relaxers have helped previously with flares. States pain is similar to previous, same character/location/severity. Has tried going to the chiropractor and PT without significant change over the past 1 year. Additionally have some discomfort to neck over past few days which she has had previously, states it feels stiff. No injury.  Denies numbness, tingling, weakness, incontinence to bowel/bladder, saddle anesthesia, fever, chills, IV drug use, or hx of cancer.   HPI  Past Medical History:  Diagnosis Date  . Asthma   . Migraines   . Sickle cell trait (HCC)   . Sickle cell trait [facility])     Patient Active Problem List   Diagnosis Date Noted  . Encounter for Nexplanon removal 07/24/2017  . Breakthrough bleeding on Nexplanon 08/07/2015  . PCOS (polycystic ovarian syndrome) 03/18/2015  . Acne cystica 02/10/2015  . Dysmenorrhea 06/24/2013     Past Surgical History:  Procedure Laterality Date  . TONSILLECTOMY    . WISDOM TOOTH EXTRACTION  2017    OB History    Gravida Para Term Preterm AB Living   0 0 0 0 0 0   SAB TAB Ectopic Multiple Live Births   0 0 0 0 0       Home Medications    Prior to Admission medications   Medication Sig Start Date End Date Taking? Authorizing Provider  albuterol (PROVENTIL HFA;VENTOLIN HFA) 108 (90 Base) MCG/ACT inhaler Inhale 1-2 puffs into the lungs every 4 (four) hours as needed for wheezing or shortness of breath. 07/25/17   Wallis Bamberg, PA-C  cetirizine (ZYRTEC ALLERGY) 10 MG tablet Take 1 tablet (10 mg total) by mouth daily. 07/25/17   Wallis Bamberg, PA-C  etonogestrel (NEXPLANON) 68 MG IMPL implant 1 each by Subdermal route once.    [provider]  fluticasone (FLONASE) 50 MCG/ACT nasal spray Place 2 sprays into both nostrils daily. 07/25/17   Wallis Bamberg, PA-C  omeprazole (PRILOSEC) 20 MG capsule Take 1 capsule (20 mg total) by mouth 2 (two) times daily before a meal. 04/02/17   Denney, Rodell Perna, CNM    Family History Family History  Problem Relation Age of Onset  . Sickle cell anemia Mother   . Cancer Maternal Grandmother   . Migraines Maternal Grandmother     Social History Social History   Tobacco Use  . Smoking status: Never Smoker  . Smokeless tobacco: Never Used  Substance Use Topics  . Alcohol use: No    Alcohol/week: 0.0 oz  . Drug use: No     Allergies   Patient has no known allergies.   Review of Systems Review of Systems  Constitutional: Negative for chills, fever and unexpected weight change.  Gastrointestinal: Negative for abdominal pain.  Genitourinary: Negative for dysuria and hematuria.  Musculoskeletal: Positive for back pain, neck pain and neck stiffness.  Neurological: Negative for weakness, numbness and headaches.       Negative for saddle anesthesia or incontinence.    Physical Exam Updated Vital Signs BP 130/67 (BP Location:  Right Arm)   Pulse 89   Temp 99.1 F (37.3 C) (Oral)   Resp 16   LMP 07/16/2017   SpO2 99%   Physical Exam  Constitutional: She appears well-developed and well-nourished.  Non-toxic appearance. No distress.  HENT:  Head: Normocephalic and atraumatic.  Eyes: Conjunctivae are normal. Right eye exhibits no discharge. Left eye exhibits no discharge.  Neck: Normal range of motion. Neck supple. Muscular tenderness (bilateral paraspinal muscles) present. No spinous process tenderness present. No neck rigidity.  Cardiovascular:  Pulses:      Radial pulses are 2+ on the right side, and 2+ on the left side.       Posterior tibial pulses are 2+ on the right side, and 2+ on the left side.  Abdominal: Soft. She exhibits no distension. There is no tenderness.  Musculoskeletal:  Back: No obvious deformity, erythema, warmth, or ecchymosis. No midline tenderness. Patient has bilateral lumbar paraspinal muscle tenderness to palpation.   Neurological: She is alert.  Clear speech. Bilateral upper and lower extremities' sensation grossly intact. 5/5 grip strength bilaterally. 5/5 plantar and dorsi flexion strength bilaterally. Patellar DTRs are 2+ and symmetric . Normal gait.   Psychiatric: She has a normal mood and affect. Her behavior is normal. Thought content normal.  Nursing note and vitals reviewed.   ED Treatments / Results  Labs (all labs ordered are listed, but only abnormal results are displayed) Labs Reviewed - No data to display  EKG  EKG Interpretation None      Radiology No results found.  Procedures Procedures (including critical care time)  Medications Ordered in ED Medications  naproxen (NAPROSYN) tablet 500 mg (500 mg Oral Given 07/28/17 2215)    Initial Impression / Assessment and Plan / ED Course  I have reviewed the triage vital signs and the nursing notes.  Pertinent labs & imaging results that were available during my care of the patient were reviewed by me and  considered in my medical decision making (see chart for details).    Patient presents with acute on chronic back pain as well as neck pain/stiffness. She is nontoxic appearing, vitals WNL. She has no neurological deficits and normal neuro exam.  There is no midline tenderness to her neck or back. Patient can walk but states is painful.  No loss of bowel or bladder control.  No concern for cauda equina.  No fever, night sweats, weight loss, h/o cancer, IVDU. Tenderness is to paraspinal muscles. Will treat with Naproxen and Robaxin as well as lidoderm patches- instructed patient she is not to drive or operate heavy machinery when taking Robaxin. I discussed treatment plan, need for PCP follow-up, and return precautions with the patient. Provided opportunity for questions, patient confirmed understanding and is in agreement with plan.   Final Clinical Impressions(s) / ED Diagnoses   Final diagnoses:  Bilateral low back pain without sciatica,  unspecified chronicity    ED Discharge Orders        Ordered    methocarbamol (ROBAXIN) 500 MG tablet  Every 8 hours PRN     07/28/17 2211    naproxen (NAPROSYN) 500 MG tablet  2 times daily     07/28/17 2211    lidocaine (LIDODERM) 5 %  Every 24 hours     07/28/17 2211       Panayiotis Rainville, Pleas Koch, PA-C 07/28/17 2222    Alvira Monday, MD 07/29/17 [phone]

## 2017-07-28 NOTE — ED Notes (Signed)
Pt stable, ambulatory, states understanding of discharge instructions 

## 2017-07-28 NOTE — Discharge Instructions (Signed)
You were seen in the emergency department today for: Back Pain  I have prescribed you an anti-inflammatory medication, a muscle relaxer, and topical patches to treat your pain.    - Naproxen is a nonsteroidal anti-inflammatory medication that will help with pain and swelling. Be sure to take this medication as prescribed with food, 1 pill every 12 hours,  It should be taken with food, as it can cause stomach upset, and more seriously, stomach bleeding. Do not take other nonsteroidal anti-inflammatory medications with this such as Advil, Motrin, or Aleve.   - Robaxin is the muscle relaxer I have prescribed, this is meant to help with muscle tightness. Be aware that this medication may make you drowsy therefore the first time you take this it should be at a time you are in an environment where you can rest. Do not drive or operate heavy machinery when taking this medication.   - Lidoderm patches- these are topical patches you can apply daily directly to your ares of discomfort. If these are too expensive at the pharmacy ask the pharmacist for the over the counter version.   In addition you may also take Tylenol. Tylenol is generally safe, though you should not take more than 8 of the extra strength (500mg ) pills a day.  The application of heat can help soothe the pain.  Maintaining your daily activities, including walking, is encourged, as it will help you get better faster than just staying in bed.  Your pain should get better over the next 2 weeks.  You will need to follow up with  your primary healthcare provider in 1-2 weeks for reassessment, if you do not have a primary care provider one is provided in your discharge instructions. However if you develop severe or worsening pain, low back pain with fever, numbness, weakness, loss of bowel or bladder control, or inability to walk or urinate, you should return to the ER immediately.  Please follow up with your doctor this week for a recheck if still  having symptoms.

## 2017-07-28 NOTE — ED Notes (Signed)
Pt ambulatory to room without difficulty 

## 2017-07-29 ENCOUNTER — Ambulatory Visit (HOSPITAL_COMMUNITY)
Admission: EM | Admit: 2017-07-29 | Discharge: 2017-07-29 | Disposition: A | Discharge status: Home / Self Care | Payer: Medicaid Other | Attending: Internal Medicine | Admitting: Internal Medicine

## 2017-07-29 ENCOUNTER — Encounter (HOSPITAL_COMMUNITY): Payer: Self-pay | Admitting: Emergency Medicine

## 2017-07-29 ENCOUNTER — Telehealth (HOSPITAL_COMMUNITY): Payer: Self-pay | Admitting: Emergency Medicine

## 2017-07-29 DIAGNOSIS — R05 Cough: Secondary | ICD-10-CM

## 2017-07-29 DIAGNOSIS — Z8709 Personal history of other diseases of the respiratory system: Secondary | ICD-10-CM

## 2017-07-29 DIAGNOSIS — J209 Acute bronchitis, unspecified: Secondary | ICD-10-CM

## 2017-07-29 DIAGNOSIS — J9801 Acute bronchospasm: Secondary | ICD-10-CM

## 2017-07-29 DIAGNOSIS — R059 Cough, unspecified: Secondary | ICD-10-CM

## 2017-07-29 MED ORDER — IPRATROPIUM-ALBUTEROL 0.5-2.5 (3) MG/3ML IN SOLN
RESPIRATORY_TRACT | Status: AC
  Filled 2017-07-29: qty 3

## 2017-07-29 MED ORDER — IPRATROPIUM-ALBUTEROL 0.5-2.5 (3) MG/3ML IN SOLN
3.0000 mL | Freq: Once | RESPIRATORY_TRACT | Status: AC
  Administered 2017-07-29: 3 mL via RESPIRATORY_TRACT

## 2017-07-29 NOTE — Discharge Instructions (Addendum)
Anticipate gradual improvement in cough over the next few weeks.  Recheck or follow-up with your PCP for new fever greater than 100.5, increasing phlegm production,  Recheck or followup with your primary care provider for new fever >100.5, persistently increasing phlegm production/nasal discharge, or if not starting to improve over the next several days.  No danger signs on exam today.  Continue cetirizine, nasal steroid spray and albuterol as prescribed at urgent care visit 1/9.  Dose of ipratropium/albuterol given at the urgent care visit today.

## 2017-07-29 NOTE — ED Notes (Signed)
OK to provide for home use per pt request per Dr. Dayton ScrapeMurray.  Pt verbalized understanding of proper use of neb.

## 2017-07-29 NOTE — ED Provider Notes (Signed)
[facility]    CSN: [id number] Arrival date & time: 07/29/17  1420     History   Chief Complaint Chief Complaint  Patient presents with  . Cough    HPI Bailey Rose is a 22 y.o. female. She presents today with persistent cough, now with scant phlegm production since visit 1/9.  Thought to have URI at that visit, managed with rx cetirizine, albuterol, fluticasone nasal.  No fever, overall not worse.  Concerned about black mold found in her complex.  Hx childhood asthma.      HPI  Past Medical History:  Diagnosis Date  . Asthma   . Migraines   . Sickle cell trait (HCC)   . Sickle cell trait [facility])     Patient Active Problem List   Diagnosis Date Noted  . Encounter for Nexplanon removal 07/24/2017  . Breakthrough bleeding on Nexplanon 08/07/2015  . PCOS (polycystic ovarian syndrome) 03/18/2015  . Acne cystica 02/10/2015  . Dysmenorrhea 06/24/2013    Past Surgical History:  Procedure Laterality Date  . TONSILLECTOMY    . WISDOM TOOTH EXTRACTION  2017    OB History    Gravida Para Term Preterm AB Living   0 0 0 0 0 0   SAB TAB Ectopic Multiple Live Births   0 0 0 0 0       Home Medications    Prior to Admission medications   Medication Sig Start Date End Date Taking? Authorizing Provider  albuterol (PROVENTIL HFA;VENTOLIN HFA) 108 (90 Base) MCG/ACT inhaler Inhale 1-2 puffs into the lungs every 4 (four) hours as needed for wheezing or shortness of breath. 07/25/17  Yes Wallis Bamberg, PA-C  cetirizine (ZYRTEC ALLERGY) 10 MG tablet Take 1 tablet (10 mg total) by mouth daily. 07/25/17  Yes Wallis Bamberg, PA-C  etonogestrel (NEXPLANON) 68 MG IMPL implant 1 each by Subdermal route once.   Yes [provider]  fluticasone (FLONASE) 50 MCG/ACT nasal spray Place 2 sprays into both nostrils daily. 07/25/17  Yes Wallis Bamberg, PA-C  lidocaine (LIDODERM) 5 % Place 1 patch onto the skin daily. Remove & Discard patch within 12 hours or as directed by MD 07/28/17   Yes Petrucelli, Samantha R, PA-C  methocarbamol (ROBAXIN) 500 MG tablet Take 1 tablet (500 mg total) by mouth every 8 (eight) hours as needed for muscle spasms. 07/28/17  Yes Petrucelli, Samantha R, PA-C  naproxen (NAPROSYN) 500 MG tablet Take 1 tablet (500 mg total) by mouth 2 (two) times daily. 07/28/17  Yes Petrucelli, Samantha R, PA-C  omeprazole (PRILOSEC) 20 MG capsule Take 1 capsule (20 mg total) by mouth 2 (two) times daily before a meal. 04/02/17   Denney, Rodell Perna, CNM    Family History Family History  Problem Relation Age of Onset  . Sickle cell anemia Mother   . Cancer Maternal Grandmother   . Migraines Maternal Grandmother     Social History Social History   Tobacco Use  . Smoking status: Never Smoker  . Smokeless tobacco: Never Used  Substance Use Topics  . Alcohol use: No    Alcohol/week: 0.0 oz  . Drug use: No     Allergies   Patient has no known allergies.   Review of Systems Review of Systems  All other systems reviewed and are negative.    Physical Exam Triage Vital Signs ED Triage Vitals [07/29/17 1444]  Enc Vitals Group     BP 107/72     Pulse Rate 99  Resp 20     Temp 98.5 F (36.9 C)     Temp Source Oral     SpO2 99 %     Weight      Peak Flow      Pain Score    Updated Vital Signs BP 107/72 (BP Location: Left Arm)   Pulse 99   Temp 98.5 F (36.9 C) (Oral)   Resp 20   LMP 07/16/2017   SpO2 99%  Physical Exam  Constitutional: She is oriented to person, place, and time. No distress.  HENT:  Head: Atraumatic.  B TMs translucent, maybe slightly pink flushed but no erythema Mod nasal congestion Throat pink, no post nasal drainage  Eyes:  Conjugate gaze observed, no eye redness/discharge  Neck: Neck supple.  Cardiovascular: Normal rate and regular rhythm.  Pulmonary/Chest: No respiratory distress. She has no wheezes. She has no rales.  Breath sounds slightly coarse but symmetric throughout  Abdominal: She exhibits no  distension.  Musculoskeletal: Normal range of motion.  Neurological: She is alert and oriented to person, place, and time.  Skin: Skin is warm and dry.  Nursing note and vitals reviewed.    UC Treatments / Results   Procedures Procedures (including critical care time)  Medications Ordered in UC Medications  ipratropium-albuterol (DUONEB) 0.5-2.5 (3) MG/3ML nebulizer solution 3 mL (3 mLs Nebulization Provided for home use 07/29/17 1515)  (patient indicated that she needed to leave because of ride availability)  Final Clinical Impressions(s) / UC Diagnoses   Final diagnoses:  Cough  Acute bronchitis with bronchospasm  History of asthma   Anticipate gradual improvement in cough over the next few weeks.  Recheck or follow-up with your PCP for new fever greater than 100.5, increasing phlegm production,  Recheck or followup with your primary care provider for new fever >100.5, persistently increasing phlegm production/nasal discharge, or if not starting to improve over the next several days.  No danger signs on exam today.  Continue cetirizine, nasal steroid spray and albuterol as prescribed at urgent care visit 1/9.  Dose of ipratropium/albuterol given at the urgent care visit today.    Isa RankinMurray, Ketina Mars Wilson, MD 07/30/17 [phone]

## 2017-07-29 NOTE — ED Triage Notes (Signed)
Pt reports she noticed brownish phlegm today w/her cough  Was seen here on 07/25/17 and was told to call us for further eval.   Sx have not worsened and CP has subsided but still having cough.   A&O X4... NAD... NAD.

## 2017-07-29 NOTE — Telephone Encounter (Signed)
Pt called letting us know that she has now developed phlegm in her cough and was told to call us for further instructions  Pt saw Wallis BambergMani Mario, [place] on 07/25/17 but no notes regarding for pt to call back for phlegm  Sts sx are getting better but wants to know what to do....   Adv pt that provider she saw her is not here today and would need to come in for further eval.   Pt got upset and sts her apartment complex has black mold and stated that it should be important for us to know and hung up/line was cut off.

## 2017-07-31 ENCOUNTER — Encounter: Payer: Self-pay | Admitting: *Deleted

## 2017-08-21 ENCOUNTER — Telehealth (INDEPENDENT_AMBULATORY_CARE_PROVIDER_SITE_OTHER): Payer: Self-pay | Admitting: *Deleted

## 2017-08-21 NOTE — Telephone Encounter (Signed)
Pt called stating she wants to go back to Physical therapy on [address] And wants to know if we can put the order in. Pt last ov was 11/09/16 with Dr. Nitka for Bil LBP.  Ok to refer to Physical therapy? 

## 2017-08-21 NOTE — Telephone Encounter (Signed)
Pt called stating she wants to go back to Physical therapy on [address] And wants to know if we can put the order in. Pt last ov was 11/09/16 with Dr. Otelia SergeantNitka for Bil LBP.  Ok to refer to Physical therapy?

## 2017-08-22 NOTE — Telephone Encounter (Signed)
Last seen about 9-10 months ago, Therapy is not a bad idea but it should be ordered by someone who has seen her More recently and are sure she is able. If she has been seen by her primary care MD or ob-gyn recently, they have the Capacity to write for Therapy and can at least verify and document that she has been seen and this is an appropriate  Use of treatment. Ask her to call her primary care MD or OB-Gyn and if they are not willing she should be seen back. For us to evaluate the need for PT. jen

## 2017-08-23 NOTE — Telephone Encounter (Signed)
I called and lmom for patient to let her know this

## 2017-08-28 ENCOUNTER — Ambulatory Visit: Payer: Medicaid Other | Admitting: Gastroenterology

## 2017-08-28 ENCOUNTER — Other Ambulatory Visit: Payer: Self-pay

## 2017-08-28 NOTE — Progress Notes (Deleted)
[facility] Consult Note:  HistoryMaura Rose 08/28/2017  Referring physician: Fleet Contras, MD  Reason for consult/chief complaint: No chief complaint on file.   Subjective  HPI:  This is a 22 year old woman referred to [place] several months ago by gynecology for reflux symptoms.  She appears to frequent the ER, with 3 visits last month for shortness of breath/atypical chest pain, back pain and respiratory infection.  She has not previously been seen by this clinic.   ROS:  Review of Systems   Past Medical History: Past Medical History:  Diagnosis Date  . Asthma   . Migraines   . Sickle cell trait (HCC)   . Sickle cell trait [facility])      Past Surgical History: Past Surgical History:  Procedure Laterality Date  . TONSILLECTOMY    . WISDOM TOOTH EXTRACTION  2017     Family History: Family History  Problem Relation Age of Onset  . Sickle cell anemia Mother   . Cancer Maternal Grandmother   . Migraines Maternal Grandmother     Social History: Social History   Socioeconomic History  . Marital status: Single    Spouse name: Not on file  . Number of children: Not on file  . Years of education: Not on file  . Highest education level: Not on file  Social Needs  . Financial resource strain: Not on file  . Food insecurity - worry: Not on file  . Food insecurity - inability: Not on file  . Transportation needs - medical: Not on file  . Transportation needs - non-medical: Not on file  Occupational History  . Not on file  Tobacco Use  . Smoking status: Never Smoker  . Smokeless tobacco: Never Used  Substance and Sexual Activity  . Alcohol use: No    Alcohol/week: 0.0 oz  . Drug use: No  . Sexual activity: Not Currently    Birth control/protection: Implant  Other Topics Concern  . Not on file  Social History Narrative  . Not on file    Allergies: No Known Allergies  Outpatient Meds: Current Outpatient Medications    Medication Sig Dispense Refill  . albuterol (PROVENTIL HFA;VENTOLIN HFA) 108 (90 Base) MCG/ACT inhaler Inhale 1-2 puffs into the lungs every 4 (four) hours as needed for wheezing or shortness of breath. 1 Inhaler 0  . cetirizine (ZYRTEC ALLERGY) 10 MG tablet Take 1 tablet (10 mg total) by mouth daily. 30 tablet 11  . etonogestrel (NEXPLANON) 68 MG IMPL implant 1 each by Subdermal route once.    . fluticasone (FLONASE) 50 MCG/ACT nasal spray Place 2 sprays into both nostrils daily. 16 g 11  . lidocaine (LIDODERM) 5 % Place 1 patch onto the skin daily. Remove & Discard patch within 12 hours or as directed by MD 30 patch 0  . methocarbamol (ROBAXIN) 500 MG tablet Take 1 tablet (500 mg total) by mouth every 8 (eight) hours as needed for muscle spasms. 30 tablet 0  . naproxen (NAPROSYN) 500 MG tablet Take 1 tablet (500 mg total) by mouth 2 (two) times daily. 30 tablet 0  . omeprazole (PRILOSEC) 20 MG capsule Take 1 capsule (20 mg total) by mouth 2 (two) times daily before a meal. 60 capsule 5   No current facility-administered medications for this visit.       ___________________________________________________________________ Objective   Exam:  There were no vitals taken for this visit.   General: this is a(n) ***   Eyes: sclera anicteric, no  redness  ENT: oral mucosa moist without lesions, no cervical or supraclavicular lymphadenopathy, good dentition  CV: RRR without murmur, S1/S2, no JVD, no peripheral edema  Resp: clear to auscultation bilaterally, normal RR and effort noted  GI: soft, *** tenderness, with active bowel sounds. No guarding or palpable organomegaly noted.  Skin; warm and dry, no rash or jaundice noted  Neuro: awake, alert and oriented x 3. Normal gross motor function and fluent speech  Labs:  ***  Radiologic Studies:  ***  Assessment: No diagnosis found.  ***  Plan:  ***  Thank you for the courtesy of this consult.  Please call me with any  questions or concerns.  Charlie PitterHenry L Danis III  CC: Fleet ContrasAvbuere, Edwin, MD

## 2017-08-29 ENCOUNTER — Telehealth (INDEPENDENT_AMBULATORY_CARE_PROVIDER_SITE_OTHER): Payer: Self-pay | Admitting: Radiology

## 2017-08-29 DIAGNOSIS — M546 Pain in thoracic spine: Secondary | ICD-10-CM

## 2017-08-29 DIAGNOSIS — G8929 Other chronic pain: Secondary | ICD-10-CM

## 2017-08-29 DIAGNOSIS — M545 Low back pain: Secondary | ICD-10-CM

## 2017-08-29 NOTE — Addendum Note (Signed)
Addended by: Cherre HugerMAY, Campbell Kray E on: 08/29/2017 03:31 PM   Modules accepted: Orders

## 2017-08-29 NOTE — Telephone Encounter (Signed)
Upon reviewing chart, patient has been seen by Zonia KiefJames Owens, PA-C April 26,2018.  She was evaluated and recommended for MRI scans of Thoracic and Lumbar Spines.  She did not have those done.  She has not been seen since that date.  She has called to request PT order, and per Dr Barbaraann FasterNitka's note, it should be ordered by someone who has seen her more recently, otherwise we should see her back in the office for re eval.  She needs to see Dr Otelia SergeantNitka, not Fayrene FearingJames.  It would be a waste of her time and ours if we have her see Fayrene FearingJames, as he has already examined her and recommended advanced imaging.  Per Fayrene FearingJames we can offer to order the MRI scans again, have her get these done, then followup in the office with Dr Otelia SergeantNitka for review. I have called patient and LM to call me to discuss tomorrow's appointment.  We need to cancel appointment with Fayrene FearingJames tomorrow and see what she wants to do about the MRI scans.

## 2017-08-29 NOTE — Telephone Encounter (Signed)
Patient called back and we discussed, I will reorder the MRI scans and she will f/u with Dr Otelia SergeantNitka on 09/28/17 and will discuss further PT at that time.

## 2017-08-30 ENCOUNTER — Ambulatory Visit (INDEPENDENT_AMBULATORY_CARE_PROVIDER_SITE_OTHER): Payer: Self-pay | Admitting: Surgery

## 2017-09-03 ENCOUNTER — Encounter: Payer: Self-pay | Admitting: Certified Nurse Midwife

## 2017-09-04 ENCOUNTER — Telehealth (INDEPENDENT_AMBULATORY_CARE_PROVIDER_SITE_OTHER): Payer: Self-pay | Admitting: *Deleted

## 2017-09-04 NOTE — Telephone Encounter (Signed)
Pt's insurance Medicaid DENIED MRI's Lumbar and Thoracic spine due to pt is needing a 3 month trial with the ordering doctor and conservative treatment like phyiscal therapy. Last ov was with Nitka on 11/11/16, I called pt left vm explaining this and to call if have questions. Pt has follow up with Nitka in March.

## 2017-09-11 NOTE — Telephone Encounter (Signed)
Can you please call her and sched appt.  He will not order for her due to her not being seen in a while

## 2017-09-11 NOTE — Telephone Encounter (Signed)
Pt called and would like to complete physical therapy. Pt stated her insurance will not pay for MRI until pt has completed physical therapy.

## 2017-09-18 ENCOUNTER — Ambulatory Visit (INDEPENDENT_AMBULATORY_CARE_PROVIDER_SITE_OTHER): Payer: Self-pay

## 2017-09-18 ENCOUNTER — Ambulatory Visit (INDEPENDENT_AMBULATORY_CARE_PROVIDER_SITE_OTHER): Payer: Medicaid Other | Admitting: Specialist

## 2017-09-18 ENCOUNTER — Encounter (INDEPENDENT_AMBULATORY_CARE_PROVIDER_SITE_OTHER): Payer: Self-pay | Admitting: Specialist

## 2017-09-18 ENCOUNTER — Ambulatory Visit (INDEPENDENT_AMBULATORY_CARE_PROVIDER_SITE_OTHER): Payer: Self-pay | Admitting: Specialist

## 2017-09-18 VITALS — BP 125/82 | HR 87 | Ht 60.0 in | Wt 144.0 lb

## 2017-09-18 DIAGNOSIS — M545 Low back pain: Secondary | ICD-10-CM | POA: Diagnosis not present

## 2017-09-18 DIAGNOSIS — M546 Pain in thoracic spine: Secondary | ICD-10-CM | POA: Diagnosis not present

## 2017-09-18 DIAGNOSIS — G8929 Other chronic pain: Secondary | ICD-10-CM | POA: Diagnosis not present

## 2017-09-18 MED ORDER — LIDOCAINE 5 % EX PTCH: 1.0000 | MEDICATED_PATCH | CUTANEOUS | Status: DC

## 2017-09-18 MED ORDER — MELOXICAM 15 MG PO TABS: 15.0000 mg | ORAL_TABLET | Freq: Every day | ORAL | 1 refills | Status: DC

## 2017-09-18 NOTE — Patient Instructions (Addendum)
Avoid frequent bending and stooping  No lifting greater than 10 lbs. May use ice or moist heat for pain. Weight loss is of benefit. Meloxicam 15 mg take one tablet daily with meals or snack.  Physical therapy recommended.  If no improvement then MRI lumbar and dorsal spine.

## 2017-09-18 NOTE — Progress Notes (Signed)
Office Visit Note   Patient: Bailey Rose           Date of Birth: August 30, 1995           MRN: [id number] Visit Date: 09/18/2017              Requested by: Fleet Contras, MD [address] [place], [place] [address] PCP: Fleet Contras, MD   Assessment & Plan: Visit Diagnoses:  1. Pain in thoracic spine   2. Chronic bilateral low back pain without sciatica     Plan: Avoid frequent bending and stooping  No lifting greater than 10 lbs. May use ice or moist heat for pain. Weight loss is of benefit. Meloxicam 15 mg take one tablet daily with meals or snack.  Physical therapy recommended.  If no improvement then MRI lumbar and dorsal spine. Meloxicam 15 mg take one tablet daily with meals or snack.  Physical therapy recommended.  If no improvement then MRI lumbar and dorsal spine.   Follow-Up Instructions: Return in about 4 weeks (around 10/16/2017).   Orders:  Orders Placed This Encounter  Procedures  . XR Thoracic Spine 2 View  . XR Lumbar Spine 2-3 Views  . Ambulatory referral to Physical Therapy   Meds ordered this encounter  Medications  . lidocaine (LIDODERM) 5 % 1 patch  . meloxicam (MOBIC) 15 MG tablet    Sig: Take 1 tablet (15 mg total) by mouth daily.    Dispense:  30 tablet    Refill:  1      Procedures: No procedures performed   Clinical Data: No additional findings.   Subjective: Chief Complaint  Patient presents with  . Middle Back - Pain  . Lower Back - Pain    22 year old female with a history of low back pain seen about a year ago and evaluated. Primary care is Dr. Donata Duff. She had a history of scoliosis and reports having  Onset of back pain during boot camp.  Running increased the pain. She feels like something is being pinched. She is presently working but not full time at [employer], been working at [employer] for 3 months. No bowel or bladder difficulty. Pain is mainly in between her shoulder blades. Back to back  days at work seem to  Worsen the pain. Standing in general in one place no longer than 5-6 hours. On and off all days is not a problem. She reports that she has been seen in [place] and attempts at  Obtaining authorization for MRI have been rejected by Medicaid.     Review of Systems  Constitutional: Negative.   HENT: Negative.   Eyes: Negative.   Respiratory: Negative.   Cardiovascular: Negative.   Gastrointestinal: Negative.   Endocrine: Negative.   Genitourinary: Negative.   Musculoskeletal: Negative.   Skin: Negative.   Allergic/Immunologic: Negative.   Neurological: Negative.   Hematological: Negative.   Psychiatric/Behavioral: Negative.      Objective: Vital Signs: BP 125/82 (BP Location: Left Arm, Patient Position: Sitting)   Pulse 87   Ht 5' (1.524 m)   Wt 144 lb (65.3 kg)   BMI 28.12 kg/m   Physical Exam  Constitutional: She is oriented to person, place, and time. She appears well-developed and well-nourished.  HENT:  Head: Normocephalic and atraumatic.  Eyes: EOM are normal. Pupils are equal, round, and reactive to light.  Neck: Normal range of motion. Neck supple.  Pulmonary/Chest: Effort normal and breath sounds normal.  Abdominal: Soft. Bowel sounds are normal.  Musculoskeletal: Normal range of motion.  Neurological: She is alert and oriented to person, place, and time.  Skin: Skin is warm and dry.  Psychiatric: She has a normal mood and affect. Her behavior is normal. Judgment and thought content normal.    Ortho Exam  Specialty Comments:  No specialty comments available.  Imaging: Xr Thoracic Spine 2 View  Result Date: 09/18/2017 AP and lateral radiographs of the thoracic spine show mild hypokyphosis of the Mid and lower thoracic spine. No fracture,or dislocation or subluxation. Lung fields do not show any abnormality.  Xr Lumbar Spine 2-3 Views  Result Date: 09/18/2017 AP and lateral flexion and extension radiographs of the lumbar spine show mild  endplate increased density at the L2-3 level, there is a 10 degree scoliosis of the lumbar spine apex to the right  At L3 measured from T12 to L4. SI joints are normal and hips appear normal previous radiographs from 09/2016 standing show 9 mm decrease length of the right leg vs the left. With Flexion and extension radiographs there is no abnormal motion. There is mild sclerosis left L5-S1 facet.     PMFS History: Patient Active Problem List   Diagnosis Date Noted  . Encounter for Nexplanon removal 07/24/2017  . Breakthrough bleeding on Nexplanon 08/07/2015  . PCOS (polycystic ovarian syndrome) 03/18/2015  . Acne cystica 02/10/2015  . Dysmenorrhea 06/24/2013   Past Medical History:  Diagnosis Date  . Asthma   . Migraines   . Sickle cell trait (HCC)   . Sickle cell trait (HCC)     Family History  Problem Relation Age of Onset  . Sickle cell anemia Mother   . Cancer Maternal Grandmother   . Migraines Maternal Grandmother     Past Surgical History:  Procedure Laterality Date  . TONSILLECTOMY    . WISDOM TOOTH EXTRACTION  2017   Social History   Occupational History  . Not on file  Tobacco Use  . Smoking status: Never Smoker  . Smokeless tobacco: Never Used  Substance and Sexual Activity  . Alcohol use: No    Alcohol/week: 0.0 oz  . Drug use: No  . Sexual activity: Not Currently    Birth control/protection: Implant

## 2017-09-24 ENCOUNTER — Other Ambulatory Visit: Payer: Self-pay

## 2017-09-24 ENCOUNTER — Encounter: Payer: Self-pay | Admitting: Physical Therapy

## 2017-09-24 ENCOUNTER — Ambulatory Visit: Payer: Medicaid Other | Attending: Specialist | Admitting: Physical Therapy

## 2017-09-24 DIAGNOSIS — M545 Low back pain: Secondary | ICD-10-CM | POA: Insufficient documentation

## 2017-09-24 DIAGNOSIS — M546 Pain in thoracic spine: Secondary | ICD-10-CM

## 2017-09-24 DIAGNOSIS — G8929 Other chronic pain: Secondary | ICD-10-CM | POA: Diagnosis present

## 2017-09-24 NOTE — Therapy (Addendum)
[place], [place], [address] Phone: [phone]   Fax:  [phone]  Physical Therapy Evaluation/Discharge Summary  Patient Details  Name: Bailey Rose MRN: [id number] Date of Birth: 1995-11-17 Referring Provider: Jessy Oto, MD   Encounter Date: 09/24/2017  PT End of Session - 09/24/17 1104    Visit Number  1    Number of Visits  13    Date for PT Re-Evaluation  11/23/17    Authorization Type  MCD- waiting for auth    PT Start Time  1059    PT Stop Time  1139    PT Time Calculation (min)  40 min    Activity Tolerance  Patient tolerated treatment well    Behavior During Therapy  [facility] for tasks assessed/performed       Past Medical History:  Diagnosis Date  . Asthma   . Migraines   . Sickle cell trait ([place])   . Sickle cell trait [facility])     Past Surgical History:  Procedure Laterality Date  . TONSILLECTOMY    . WISDOM TOOTH EXTRACTION  2017    There were no vitals filed for this visit.   Subjective Assessment - 09/24/17 1105    Subjective  Over a year of back pain. PT last year in conjunction with massage was helpful for about 2 days but feels like pain came back worse than it was. Does yoga at the [facility] that helps a little bit. Now feels less pain in lumbar spine, more from mid back and superior. Denies any regular strengthening. Wants to have MRI. REports nausea with exercise due to pain, had to run to class with book bag with 2 notebooks made her nauseated due ot pain.    How long can you sit comfortably?  an hour if back is supported    How long can you walk comfortably?  5.5 hours    Patient Stated Goals  decrease pain, exercise    Currently in Pain?  Yes    Pain Score  5     Pain Location  Back    Pain Orientation  Mid;Upper;Right;Left    Pain Descriptors / Indicators  -- strain    Aggravating Factors   sitting unsupported, exercise    Pain Relieving Factors  massage, heat          [facility] PT Assessment - 09/24/17 0001      Assessment   Medical Diagnosis  back pain    Referring Provider  Jessy Oto, MD    Onset Date/Surgical Date  -- 1+ year    Hand Dominance  Right    Next MD Visit  10/25/17    Prior Therapy  last year      Precautions   Precautions  None      Restrictions   Weight Bearing Restrictions  No      Balance Screen   Has the patient fallen in the past 6 months  No      [place] residence    Additional Comments  apartment- 3rd floor      Prior Function   Level of Independence  Independent    Vocation  Part time employment    Vocation Requirements  works at [employer]   Overall Cognitive Status  Within Functional Limits for tasks assessed      Sensation   Additional Comments  [facility]  Posture/Postural Control   Posture Comments  forward head, rounded shoulders, increased lumbar lordosis      ROM / Strength   AROM / PROM / Strength  Strength      Strength   Overall Strength Comments  gross UE 4-/5, bil hip abd 4/5             Objective measurements completed on examination: See above findings.      [facility] Adult PT Treatment/Exercise - 09/24/17 0001      Exercises   Exercises  Lumbar      Lumbar Exercises: Seated   Other Seated Lumbar Exercises  scapular retraction    Other Seated Lumbar Exercises  cervical reatraction, resting posture      Lumbar Exercises: Supine   AB Set Limitations  TrA engagement             PT Education - 09/24/17 1315    Education provided  Yes    Education Details  anatomy of condition, POC, HEP, exercise form/rationale, sleeping posture, importance of postural alignment & strengthening    Person(s) Educated  Patient    Methods  Explanation;Demonstration;Tactile cues;Verbal cues;Handout    Comprehension  Verbalized understanding;Need further instruction;Returned demonstration;Verbal cues required;Tactile cues required        PT Short Term Goals - 09/24/17 1316      PT SHORT TERM GOAL #1   Title  Pt will be able to demonstrate proper scapular retraction without cuing    Baseline  heavy cues requried at eval    Time  3    Period  Weeks    Status  New    Target Date  10/19/17      PT SHORT TERM GOAL #2   Title  Pt will be independent with HEP as it has been established in short term    Baseline  will progress as appropriate    Time  3    Period  Weeks    Status  New    Target Date  10/19/17        PT Long Term Goals - 09/24/17 1318      PT LONG TERM GOAL #1   Title  Gross UE strength to 5/5 for proper support to biomechanical chain    Baseline  gross 4-/5 at eval    Time  8 to accomodate for medicaid authorization periods    Period  Weeks    Status  New    Target Date  11/23/17      PT LONG TERM GOAL #2   Title  Pt will be able to exercise without feeling nauseated due to pain    Baseline  has stopped strengthening at eval due to nausea produced    Time  8    Period  Weeks    Status  New    Target Date  11/23/17      PT LONG TERM GOAL #3   Title  Pt will be able to sleep through the night without being woken by back pain    Baseline  significant difficulty sleeping at eval    Time  8    Period  Weeks    Status  New    Target Date  11/23/17      PT LONG TERM GOAL #4   Title  Average pain with ADLs <=2/10    Baseline  5/10 at rest at eval    Time  8    Period  Weeks  Status  New    Target Date  11/23/17             Plan - 09/24/17 1139    Clinical Impression Statement  Pt presents to PT with complaints of back pain that began about 1 year ago. She did 3 visits of PT last year and then did not come to the rest of her appointments, felt massage was helpful. Discussed how massage will decrease pain for a short time but if posture is not strengthened, pain will return. Educated on difference betweek massage vs manual treatments that will be provided in PT. Gross weakness  noted along biomechanical chain with poor postural alignment resulting in pain. Pt will benefit from skilled PT to improve postural strength and endurance to meet long term functional goals.     Clinical Presentation  Stable    Clinical Decision Making  Low    Rehab Potential  Good    PT Frequency  2x / week    PT Duration  6 weeks    PT Treatment/Interventions  ADLs/Self Care Home Management;Cryotherapy;Electrical Stimulation;Traction;Moist Heat;Therapeutic activities;Therapeutic exercise;Patient/family education;Manual techniques;Passive range of motion;Taping;Dry needling    PT Next Visit Plan  continue postural education, prone periscap strengthening, core strength    PT Home Exercise Plan  scapular & cervical retraction, TrA engagement    Consulted and Agree with Plan of Care  Patient       Patient will benefit from skilled therapeutic intervention in order to improve the following deficits and impairments:  Pain, Improper body mechanics, Increased muscle spasms, Postural dysfunction, Decreased strength, Decreased activity tolerance  Visit Diagnosis: Pain in thoracic spine - Plan: PT plan of care cert/re-cert  Chronic low back pain without sciatica, unspecified back pain laterality - Plan: PT plan of care cert/re-cert     Problem List Patient Active Problem List   Diagnosis Date Noted  . Encounter for Nexplanon removal 07/24/2017  . Breakthrough bleeding on Nexplanon 08/07/2015  . PCOS (polycystic ovarian syndrome) 03/18/2015  . Acne cystica 02/10/2015  . Dysmenorrhea 06/24/2013    Tenita Cue C. Rayan Ines PT, DPT 09/24/17 1:24 PM   [facility] [address] [place], [place], [address] Phone: [phone]   Fax:  [phone]  Name: Bailey Rose MRN: [id number] Date of Birth: 05/19/1996   PHYSICAL THERAPY DISCHARGE SUMMARY  Visits from Start of Care: 1  Current functional level related to goals / functional  outcomes: See above   Remaining deficits: See above   Education / Equipment: Anatomy of condition, POC, HEP, exercise form/rationale  Plan: Patient agrees to discharge.  Patient goals were not met. Patient is being discharged due to not returning since the last visit.  ?????    Pt did not reschedule after cancelling vistis and MCD authorization ends 4/28.    Kiyah Demartini C. Caylor Cerino PT, DPT 11/09/17 9:22 AM

## 2017-09-25 ENCOUNTER — Other Ambulatory Visit: Payer: Self-pay | Admitting: Certified Nurse Midwife

## 2017-09-25 DIAGNOSIS — B373 Candidiasis of vulva and vagina: Secondary | ICD-10-CM

## 2017-09-25 DIAGNOSIS — B3731 Acute candidiasis of vulva and vagina: Secondary | ICD-10-CM

## 2017-09-27 ENCOUNTER — Telehealth: Payer: Self-pay | Admitting: Pediatrics

## 2017-09-27 MED ORDER — FLUCONAZOLE 150 MG PO TABS: 150.0000 mg | ORAL_TABLET | Freq: Once | ORAL | 0 refills | Status: AC

## 2017-09-27 NOTE — Telephone Encounter (Signed)
Pt called c/o being on cycle and not wanting to use Terazol for CVag.  I advised I would send tablet.  Rx sent per standing order.

## 2017-09-28 ENCOUNTER — Ambulatory Visit (INDEPENDENT_AMBULATORY_CARE_PROVIDER_SITE_OTHER): Payer: Self-pay | Admitting: Specialist

## 2017-10-25 ENCOUNTER — Ambulatory Visit (INDEPENDENT_AMBULATORY_CARE_PROVIDER_SITE_OTHER): Payer: Medicaid Other | Admitting: Specialist

## 2017-10-31 ENCOUNTER — Encounter: Payer: Medicaid Other | Admitting: Physical Therapy

## 2017-11-05 ENCOUNTER — Ambulatory Visit: Payer: Medicaid Other | Admitting: Physical Therapy

## 2017-11-07 ENCOUNTER — Ambulatory Visit: Payer: Medicaid Other | Attending: Specialist

## 2017-11-15 ENCOUNTER — Ambulatory Visit (INDEPENDENT_AMBULATORY_CARE_PROVIDER_SITE_OTHER): Payer: Self-pay | Admitting: Specialist

## 2017-11-29 ENCOUNTER — Ambulatory Visit (INDEPENDENT_AMBULATORY_CARE_PROVIDER_SITE_OTHER): Payer: Medicaid Other | Admitting: Certified Nurse Midwife

## 2017-11-29 ENCOUNTER — Encounter: Payer: Self-pay | Admitting: Licensed Clinical Social Worker

## 2017-11-29 ENCOUNTER — Encounter: Payer: Self-pay | Admitting: Certified Nurse Midwife

## 2017-11-29 ENCOUNTER — Other Ambulatory Visit (HOSPITAL_COMMUNITY)
Admission: RE | Admit: 2017-11-29 | Discharge: 2017-11-29 | Disposition: A | Discharge status: Home / Self Care | Payer: Medicaid Other | Source: Ambulatory Visit | Attending: Certified Nurse Midwife | Admitting: Certified Nurse Midwife

## 2017-11-29 VITALS — BP 116/77 | HR 103 | Wt 139.0 lb

## 2017-11-29 DIAGNOSIS — N898 Other specified noninflammatory disorders of vagina: Secondary | ICD-10-CM

## 2017-11-29 DIAGNOSIS — Z113 Encounter for screening for infections with a predominantly sexual mode of transmission: Secondary | ICD-10-CM

## 2017-11-29 DIAGNOSIS — E282 Polycystic ovarian syndrome: Secondary | ICD-10-CM | POA: Diagnosis not present

## 2017-11-29 MED ORDER — NORETHIN-ETH ESTRAD-FE BIPHAS 1 MG-10 MCG / 10 MCG PO TABS: 1.0000 | ORAL_TABLET | Freq: Every day | ORAL | 4 refills | Status: DC

## 2017-11-29 NOTE — Progress Notes (Signed)
RGYN c/o possible yeast infection.  Pt also notes weight loss since stopping birth control.    "wants evaluation to be sure everything is ok "

## 2017-11-29 NOTE — Progress Notes (Signed)
Patient ID: Bailey Rose, female   DOB: Nov 20, 1995, 22 y.o.   MRN: [id number]  Chief Complaint  Patient presents with  . Vaginitis    HPI Bailey Rose is a 22 y.o. female.  Here for change in vaginal discharge and contraception management.  Had Nexplanon removed 07/24/17.  Has tried Depo injections and Ortho Evera (hormonal patches) in the past.  Does not want IUD.  Her period was earlier this month on the 6th instead of the 12th.  Has removed meat products from her diet and increased fruits/vegitables.  States regular monthly periods since Nexplanon removal.  Lasting 3-4 days.  Does have a hx of PCOS.  Desires to try OCPs.  Is currently sexually active, reports Latex sensitivity.  Condom use encouraged for the first 2 weeks of her pill pack.    Reviewed all forms of birth control options available including abstinence; fertility period awareness methods; over the counter/barrier methods; hormonal contraceptive medication including pill, patch, ring, injection,contraceptive implant; hormonal and nonhormonal IUDs; permanent sterilization options including vasectomy and the various tubal sterilization modalities. Risks and benefits reviewed.  Questions were answered.  Information was given to patient to review.    HPI  Past Medical History:  Diagnosis Date  . Asthma   . Migraines   . Sickle cell trait ([facility])   . Sickle cell trait [facility])     Past Surgical History:  Procedure Laterality Date  . TONSILLECTOMY    . WISDOM TOOTH EXTRACTION  2017    Family History  Problem Relation Age of Onset  . Sickle cell anemia Mother   . Cancer Maternal Grandmother   . Migraines Maternal Grandmother     Social History Social History   Tobacco Use  . Smoking status: Never Smoker  . Smokeless tobacco: Never Used  Substance Use Topics  . Alcohol use: No    Alcohol/week: 0.0 oz  . Drug use: No    No Known Allergies  Current Outpatient Medications  Medication Sig Dispense Refill  .  albuterol (PROVENTIL HFA;VENTOLIN HFA) 108 (90 Base) MCG/ACT inhaler Inhale 1-2 puffs into the lungs every 4 (four) hours as needed for wheezing or shortness of breath. 1 Inhaler 0  . cetirizine (ZYRTEC ALLERGY) 10 MG tablet Take 1 tablet (10 mg total) by mouth daily. 30 tablet 11  . fluticasone (FLONASE) 50 MCG/ACT nasal spray Place 2 sprays into both nostrils daily. 16 g 11  . lidocaine (LIDODERM) 5 % Place 1 patch onto the skin daily. Remove & Discard patch within 12 hours or as directed by MD 30 patch 0  . meloxicam (MOBIC) 15 MG tablet Take 1 tablet (15 mg total) by mouth daily. 30 tablet 1  . naproxen (NAPROSYN) 500 MG tablet Take 1 tablet (500 mg total) by mouth 2 (two) times daily. 30 tablet 0  . etonogestrel (NEXPLANON) 68 MG IMPL implant 1 each by Subdermal route once.    . methocarbamol (ROBAXIN) 500 MG tablet Take 1 tablet (500 mg total) by mouth every 8 (eight) hours as needed for muscle spasms. (Patient not taking: Reported on 11/29/2017) 30 tablet 0  . Norethindrone-Ethinyl Estradiol-Fe Biphas (LO LOESTRIN FE) 1 MG-10 MCG / 10 MCG tablet Take 1 tablet by mouth daily. Take 1 tablet by mouth daily. 3 Package 4  . omeprazole (PRILOSEC) 20 MG capsule Take 1 capsule (20 mg total) by mouth 2 (two) times daily before a meal. (Patient not taking: Reported on 11/29/2017) 60 capsule 5   Current Facility-Administered Medications  Medication  Dose Route Frequency Provider Last Rate Last Dose  . lidocaine (LIDODERM) 5 % 1 patch  1 patch Transdermal Q24H Kerrin Champagne, MD        Review of Systems Review of Systems Constitutional: negative for fatigue and weight loss Respiratory: negative for cough and wheezing Cardiovascular: negative for chest pain, fatigue and palpitations Gastrointestinal: negative for abdominal pain and change in bowel habits Genitourinary:+ vaginal discharge Integument/breast: negative for nipple discharge Musculoskeletal:negative for myalgias Neurological: negative for  gait problems and tremors Behavioral/Psych: negative for abusive relationship, depression Endocrine: negative for temperature intolerance      Blood pressure 116/77, pulse (!) 103, weight 139 lb (63 kg), last menstrual period 11/19/2017.  Physical Exam Physical Exam General:   alert  Skin:   no rash or abnormalities  Lungs:   clear to auscultation bilaterally  Heart:   regular rate and rhythm, S1, S2 normal, no murmur, click, rub or gallop  Breasts:   normal without suspicious masses, skin or nipple changes or axillary nodes  Abdomen:  normal findings: no organomegaly, soft, non-tender and no hernia  Pelvis:  External genitalia: normal general appearance Urinary system: urethral meatus normal and bladder without fullness, nontender Vaginal: deferred Cervix: deferred Adnexa: deferred Uterus: deferred    50% of 15 min visit spent on counseling and coordination of care.   Data Reviewed Previous medical hx, meds, labs  Assessment     1. Vaginal discharge    - Cervicovaginal ancillary only  2. Screening examination for STD (sexually transmitted disease)    - RPR - Hepatitis C antibody - Hepatitis B surface antigen - HIV antibody  3. PCOS (polycystic ovarian syndrome)    - US PELVIC COMPLETE WITH TRANSVAGINAL; Future - TSH - Prolactin - Testosterone, Free, Total, SHBG - 17-Hydroxyprogesterone - Progesterone     Plan    Orders Placed This Encounter  Procedures  . US PELVIC COMPLETE WITH TRANSVAGINAL    Standing Status:   Future    Standing Expiration Date:   01/30/2019    Order Specific Question:   Reason for Exam (SYMPTOM  OR DIAGNOSIS REQUIRED)    Answer:   Hx of PCOS    Order Specific Question:   Preferred imaging location?    Answer:   [facility]  . RPR  . Hepatitis C antibody  . Hepatitis B surface antigen  . HIV antibody  . TSH  . Prolactin  . Testosterone, Free, Total, SHBG  . 17-Hydroxyprogesterone  . Progesterone   Meds ordered this  encounter  Medications  . Norethindrone-Ethinyl Estradiol-Fe Biphas (LO LOESTRIN FE) 1 MG-10 MCG / 10 MCG tablet    Sig: Take 1 tablet by mouth daily. Take 1 tablet by mouth daily.    Dispense:  3 Package    Refill:  4    BIN # [id number], PCN# [id number], ID#: [id number]     Possible management options include:Nuva Ring Follow up as needed/in 3 months for contraception management.

## 2017-11-30 LAB — CERVICOVAGINAL ANCILLARY ONLY
Bacterial vaginitis: POSITIVE — AB
CHLAMYDIA, DNA PROBE: NEGATIVE
Candida vaginitis: NEGATIVE
NEISSERIA GONORRHEA: NEGATIVE
TRICH (WINDOWPATH): NEGATIVE

## 2017-12-01 LAB — HIV ANTIBODY (ROUTINE TESTING W REFLEX): HIV SCREEN 4TH GENERATION: NONREACTIVE

## 2017-12-01 LAB — TESTOSTERONE, FREE, TOTAL, SHBG
Sex Hormone Binding: 58.1 nmol/L (ref 24.6–122.0)
TESTOSTERONE FREE: 2.1 pg/mL
Testosterone: 36 ng/dL

## 2017-12-01 LAB — PROLACTIN: PROLACTIN: 10.3 ng/mL (ref 4.8–23.3)

## 2017-12-01 LAB — HEPATITIS C ANTIBODY: Hep C Virus Ab: <0.1 s/co ratio (ref 0.0–0.9)

## 2017-12-01 LAB — 17-HYDROXYPROGESTERONE: 17-Hydroxyprogesterone: 188 ng/dL

## 2017-12-01 LAB — RPR: RPR Ser Ql: NONREACTIVE

## 2017-12-01 LAB — TSH: TSH: 0.686 u[IU]/mL (ref 0.450–4.500)

## 2017-12-01 LAB — PROGESTERONE: Progesterone: 8 ng/mL

## 2017-12-01 LAB — HEPATITIS B SURFACE ANTIGEN: Hepatitis B Surface Ag: NEGATIVE

## 2017-12-03 ENCOUNTER — Other Ambulatory Visit: Payer: Self-pay | Admitting: Certified Nurse Midwife

## 2017-12-03 ENCOUNTER — Other Ambulatory Visit: Payer: Self-pay

## 2017-12-03 DIAGNOSIS — N760 Acute vaginitis: Principal | ICD-10-CM

## 2017-12-03 DIAGNOSIS — B9689 Other specified bacterial agents as the cause of diseases classified elsewhere: Secondary | ICD-10-CM

## 2017-12-03 MED ORDER — SECNIDAZOLE 2 G PO PACK: 1.0000 | PACK | Freq: Once | ORAL | 0 refills | Status: AC

## 2017-12-03 NOTE — Progress Notes (Signed)
CSW A. Linton Rump met privately with pt regarding contraception options. CSW reviewed options with pt and advised further questions will be discussed with CNM R. Margurite Auerbach

## 2017-12-03 NOTE — Progress Notes (Signed)
PA approved  For solosec 2 gm  PA # [id number]  Faxed approval to Pharmacy

## 2017-12-06 ENCOUNTER — Ambulatory Visit (HOSPITAL_COMMUNITY)
Admission: RE | Admit: 2017-12-06 | Discharge: 2017-12-06 | Disposition: A | Discharge status: Home / Self Care | Payer: Medicaid Other | Source: Ambulatory Visit | Attending: Certified Nurse Midwife | Admitting: Certified Nurse Midwife

## 2017-12-06 ENCOUNTER — Encounter: Payer: Self-pay | Admitting: Certified Nurse Midwife

## 2017-12-06 ENCOUNTER — Ambulatory Visit (INDEPENDENT_AMBULATORY_CARE_PROVIDER_SITE_OTHER): Payer: Medicaid Other | Admitting: Certified Nurse Midwife

## 2017-12-06 VITALS — BP 99/63 | HR 92 | Wt 139.4 lb

## 2017-12-06 DIAGNOSIS — L739 Follicular disorder, unspecified: Secondary | ICD-10-CM | POA: Diagnosis not present

## 2017-12-06 DIAGNOSIS — E282 Polycystic ovarian syndrome: Secondary | ICD-10-CM

## 2017-12-06 NOTE — Progress Notes (Signed)
Patient reports sharp, left pelvic pain that started this morning when she was on her way to U/S appt.

## 2017-12-06 NOTE — Progress Notes (Signed)
Patient ID: Bailey Rose, female   DOB: 1995-08-04, 22 y.o.   MRN: [id number]  Chief Complaint  Patient presents with  . GYN    HPI Bailey Rose is a 22 y.o. female.   Here for f/u after [place] and lab work.  States right groin pain started last night, denies any urinary symptoms,  States that she feels a lump along her underware line.  Has an in-grown hair on labia on the right side.  Has not tried anything for it.     HPI  Past Medical History:  Diagnosis Date  . Asthma   . Migraines   . Sickle cell trait (H[facility])   . Sickle cell trait [facility])     Past Surgical History:  Procedure Laterality Date  . TONSILLECTOMY    . WISDOM TOOTH EXTRACTION  2017    Family History  Problem Relation Age of Onset  . Sickle cell anemia Mother   . Cancer Maternal Grandmother   . Migraines Maternal Grandmother     Social History Social History   Tobacco Use  . Smoking status: Never Smoker  . Smokeless tobacco: Never Used  Substance Use Topics  . Alcohol use: No    Alcohol/week: 0.0 oz  . Drug use: No    No Known Allergies  Current Outpatient Medications  Medication Sig Dispense Refill  . Norethindrone-Ethinyl Estradiol-Fe Biphas (LO LOESTRIN FE) 1 MG-10 MCG / 10 MCG tablet Take 1 tablet by mouth daily. Take 1 tablet by mouth daily. 3 Package 4  . albuterol (PROVENTIL HFA;VENTOLIN HFA) 108 (90 Base) MCG/ACT inhaler Inhale 1-2 puffs into the lungs every 4 (four) hours as needed for wheezing or shortness of breath. 1 Inhaler 0  . cetirizine (ZYRTEC ALLERGY) 10 MG tablet Take 1 tablet (10 mg total) by mouth daily. 30 tablet 11  . etonogestrel (NEXPLANON) 68 MG IMPL implant 1 each by Subdermal route once.    . fluticasone (FLONASE) 50 MCG/ACT nasal spray Place 2 sprays into both nostrils daily. 16 g 11  . lidocaine (LIDODERM) 5 % Place 1 patch onto the skin daily. Remove & Discard patch within 12 hours or as directed by MD 30 patch 0  . meloxicam (MOBIC) 15 MG tablet Take 1 tablet (15 mg  total) by mouth daily. 30 tablet 1  . methocarbamol (ROBAXIN) 500 MG tablet Take 1 tablet (500 mg total) by mouth every 8 (eight) hours as needed for muscle spasms. (Patient not taking: Reported on 11/29/2017) 30 tablet 0  . naproxen (NAPROSYN) 500 MG tablet Take 1 tablet (500 mg total) by mouth 2 (two) times daily. (Patient not taking: Reported on 12/06/2017) 30 tablet 0  . omeprazole (PRILOSEC) 20 MG capsule Take 1 capsule (20 mg total) by mouth 2 (two) times daily before a meal. (Patient not taking: Reported on 11/29/2017) 60 capsule 5   Current Facility-Administered Medications  Medication Dose Route Frequency Provider Last Rate Last Dose  . lidocaine (LIDODERM) 5 % 1 patch  1 patch Transdermal Q24H Kerrin Champagne, MD        Review of Systems Review of Systems Constitutional: negative for fatigue and weight loss Respiratory: negative for cough and wheezing Cardiovascular: negative for chest pain, fatigue and palpitations Gastrointestinal: negative for abdominal pain and change in bowel habits Genitourinary: + in-grown hair Integument/breast: negative for nipple discharge Musculoskeletal:negative for myalgias Neurological: negative for gait problems and tremors Behavioral/Psych: negative for abusive relationship, depression Endocrine: negative for temperature intolerance      Blood pressure  99/63, pulse 92, weight 139 lb 6.4 oz (63.2 kg), last menstrual period 11/19/2017.  Physical Exam Physical Exam General:   alert  Skin:   no rash or abnormalities  Lungs:   clear to auscultation bilaterally  Heart:   regular rate and rhythm, S1, S2 normal, no murmur, click, rub or gallop  Breasts:   deferred  Abdomen:  normal findings: no organomegaly, soft, non-tender and no hernia  Pelvis:  External genitalia: normal general appearance, + hair follicle inflammation on right labia Urinary system: urethral meatus normal and bladder without fullness, nontender Vaginal: deferred Cervix:  deferred Adnexa: deferred Uterus: deferred    50% of 15 min visit spent on counseling and coordination of care.   Data Reviewed Previous medical hx, meds, labs, [place] results  Assessment     H/O PCOS In grown hair follicle Normal pelvic US     Plan     Follow up as needed.

## 2017-12-07 ENCOUNTER — Telehealth: Payer: Self-pay

## 2017-12-07 NOTE — Telephone Encounter (Signed)
patient threw up Rx for solosec  Please advise.

## 2017-12-11 ENCOUNTER — Other Ambulatory Visit: Payer: Self-pay | Admitting: Certified Nurse Midwife

## 2017-12-11 DIAGNOSIS — B9689 Other specified bacterial agents as the cause of diseases classified elsewhere: Secondary | ICD-10-CM

## 2017-12-11 DIAGNOSIS — N760 Acute vaginitis: Principal | ICD-10-CM

## 2017-12-11 MED ORDER — TINIDAZOLE 500 MG PO TABS: 2.0000 g | ORAL_TABLET | Freq: Every day | ORAL | 0 refills | Status: DC

## 2017-12-11 NOTE — Telephone Encounter (Signed)
Please let her know that Tindamax was sent to the pharmacy for her to take in the AM with food.  Thank you.  Asmara Backs

## 2017-12-13 ENCOUNTER — Ambulatory Visit (INDEPENDENT_AMBULATORY_CARE_PROVIDER_SITE_OTHER): Payer: Medicaid Other | Admitting: Specialist

## 2017-12-13 ENCOUNTER — Encounter (INDEPENDENT_AMBULATORY_CARE_PROVIDER_SITE_OTHER): Payer: Self-pay | Admitting: Specialist

## 2017-12-13 VITALS — BP 100/57 | HR 68 | Ht 60.0 in | Wt 140.0 lb

## 2017-12-13 DIAGNOSIS — M5442 Lumbago with sciatica, left side: Secondary | ICD-10-CM | POA: Diagnosis not present

## 2017-12-13 DIAGNOSIS — M4157 Other secondary scoliosis, lumbosacral region: Secondary | ICD-10-CM

## 2017-12-13 DIAGNOSIS — M629 Disorder of muscle, unspecified: Secondary | ICD-10-CM | POA: Diagnosis not present

## 2017-12-13 DIAGNOSIS — R202 Paresthesia of skin: Secondary | ICD-10-CM

## 2017-12-13 DIAGNOSIS — G8929 Other chronic pain: Secondary | ICD-10-CM

## 2017-12-13 DIAGNOSIS — M5441 Lumbago with sciatica, right side: Secondary | ICD-10-CM | POA: Diagnosis not present

## 2017-12-13 DIAGNOSIS — R200 Anesthesia of skin: Secondary | ICD-10-CM

## 2017-12-13 MED ORDER — MELOXICAM 7.5 MG PO TABS: 7.5000 mg | ORAL_TABLET | Freq: Every day | ORAL | 3 refills | Status: DC

## 2017-12-13 NOTE — Patient Instructions (Addendum)
Plan:Avoid frequent bending and stooping  No lifting greater than 10 lbs. May use ice or moist heat for pain. Postural exercises and core strengthening exercises of the lumbar and thoracic spine. MRI of the cervical, lumbar and thoracic spine are recommended due to hamstring tightness, lower extremity nerve symptoms and  Pain. Scoliosis is not painful but can be a sign of an underlying condition that is causing the leg symptoms.  Recommend contacting social worker and [facility] and requesting talk with office about medical assistance with financing.

## 2017-12-13 NOTE — Progress Notes (Signed)
Office Visit Note   Patient: Bailey Rose           Date of Birth: Nov 20, 1995           MRN: [id number] Visit Date: 12/13/2017              Requested by: Fleet Contras, MD [address] [place], [place] [address] PCP: Fleet Contras, MD   Assessment & Plan: Visit Diagnoses:  1. Chronic bilateral low back pain with bilateral sciatica   2. Other secondary scoliosis, lumbosacral region   3. Hamstring tightness of both lower extremities   4. Bilateral numbness and tingling of arms and legs     Plan:Avoid frequent bending and stooping  No lifting greater than 10 lbs. May use ice or moist heat for pain. Postural exercises and core strengthening exercises of the lumbar and thoracic spine. MRI of the cervical, lumbar and thoracic spine are recommended due to hamstring tightness, lower extremity nerve symptoms and  Pain. Scoliosis is not painful but can be a sign of an underlying condition that is causing the leg symptoms.   Recommend contacting social worker and [facility] and requesting talk with office about medical assistance with financing.   Follow-Up Instructions: No follow-ups on file.   Orders:  No orders of the defined types were placed in this encounter.  No orders of the defined types were placed in this encounter.     Procedures: No procedures performed   Clinical Data: No additional findings.   Subjective: Chief Complaint  Patient presents with  . Lower Back - Follow-up  . Spine - Follow-up    22 year old female with history of mid back pain and discomfort. She attempted the use of the meloxicam and this was too strong and after one or two doses the medication bothered her. She is able to take the naproxyn and she takes it before work in the AM and when she is uncomfortable. There was two weeks ago heat going down the legs the whole legs and it feels like it was pulling on my whole leg. There is paper work I need filled out and can you do it. Has  some hot flashes due to meloxicam. Sitting is better than standing and walking. Bending and stooping is painful. She went to PT and had  Instruction on exercises. No leg numbness or weakness. Going up steps make the legs bother her. No bowel or bladder difficulty, no weight loss, and  She is sleeping better with a new mattress.   Review of Systems  Constitutional: Negative.   HENT: Negative.   Eyes: Negative.   Respiratory: Negative.   Cardiovascular: Negative.   Gastrointestinal: Negative.   Endocrine: Negative.   Genitourinary: Negative.   Musculoskeletal: Negative.   Skin: Negative.   Allergic/Immunologic: Negative.   Neurological: Negative.   Hematological: Negative.   Psychiatric/Behavioral: Negative.      Objective: Vital Signs: BP (!) 100/57   Pulse 68   Ht 5' (1.524 m)   Wt 140 lb (63.5 kg)   LMP 11/19/2017 (Exact Date)   BMI 27.34 kg/m   Physical Exam  Constitutional: She is oriented to person, place, and time. She appears well-developed and well-nourished.  HENT:  Head: Normocephalic and atraumatic.  Eyes: Pupils are equal, round, and reactive to light. EOM are normal.  Neck: Normal range of motion. Neck supple.  Pulmonary/Chest: Effort normal and breath sounds normal.  Abdominal: Soft. Bowel sounds are normal.  Neurological: She is alert and  oriented to person, place, and time.  Skin: Skin is warm and dry.  Psychiatric: She has a normal mood and affect. Her behavior is normal. Judgment and thought content normal.    Back Exam   Tenderness  The patient is experiencing tenderness in the lumbar.  Range of Motion  Extension: abnormal  Flexion: normal  Lateral bend right: normal  Lateral bend left: normal  Rotation right: normal  Rotation left: normal   Muscle Strength  The patient has normal back strength. Right Quadriceps:  5/5  Left Quadriceps:  5/5  Right Hamstrings:  5/5  Left Hamstrings:  5/5   Tests  Straight leg raise right:  negative Straight leg raise left: negative  Reflexes  Patellar: normal Achilles: normal Babinski's sign: normal   Other  Toe walk: normal Heel walk: normal Sensation: normal Gait: normal  Erythema: no back redness Scars: absent  Comments:  Bilateral hamstring tightness both hips.       Specialty Comments:  No specialty comments available.  Imaging: No results found.   PMFS History: Patient Active Problem List   Diagnosis Date Noted  . Encounter for Nexplanon removal 07/24/2017  . Breakthrough bleeding on Nexplanon 08/07/2015  . PCOS (polycystic ovarian syndrome) 03/18/2015  . Acne cystica 02/10/2015  . Dysmenorrhea 06/24/2013   Past Medical History:  Diagnosis Date  . Asthma   . Migraines   . Sickle cell trait (HCC)   . Sickle cell trait (HCC)     Family History  Problem Relation Age of Onset  . Sickle cell anemia Mother   . Cancer Maternal Grandmother   . Migraines Maternal Grandmother     Past Surgical History:  Procedure Laterality Date  . TONSILLECTOMY    . WISDOM TOOTH EXTRACTION  2017   Social History   Occupational History  . Not on file  Tobacco Use  . Smoking status: Never Smoker  . Smokeless tobacco: Never Used  Substance and Sexual Activity  . Alcohol use: No    Alcohol/week: 0.0 oz  . Drug use: No  . Sexual activity: Not Currently    Birth control/protection: Pill

## 2017-12-17 ENCOUNTER — Telehealth (INDEPENDENT_AMBULATORY_CARE_PROVIDER_SITE_OTHER): Payer: Self-pay | Admitting: Specialist

## 2017-12-17 ENCOUNTER — Encounter (INDEPENDENT_AMBULATORY_CARE_PROVIDER_SITE_OTHER): Payer: Self-pay | Admitting: Specialist

## 2017-12-17 NOTE — Telephone Encounter (Signed)
Patient called asked for a call back when the paperwork is ready that she left for Dr Otelia SergeantNitka to complete Thursday. The number to contact patient is [phone]

## 2017-12-17 NOTE — Telephone Encounter (Signed)
Patient request a return to work not, patient states she was out of work yesterday due to severe back pain and her employer  Requires a note before tomorrow she can return. Please call [phone]

## 2017-12-17 NOTE — Telephone Encounter (Signed)
Note to allow return to work is signed and at your desk. Bailey Rose

## 2017-12-17 NOTE — Telephone Encounter (Signed)
Patient request a return to work not, patient states she was out of work yesterday due to severe back pain and her employer  Requires a note before tomorrow she can return.

## 2017-12-18 NOTE — Telephone Encounter (Signed)
I called and lmom that her letter was ready for pick up @ the front desk

## 2017-12-27 ENCOUNTER — Ambulatory Visit (INDEPENDENT_AMBULATORY_CARE_PROVIDER_SITE_OTHER): Payer: Self-pay | Admitting: Specialist

## 2017-12-27 ENCOUNTER — Encounter (INDEPENDENT_AMBULATORY_CARE_PROVIDER_SITE_OTHER): Payer: Self-pay

## 2018-01-06 ENCOUNTER — Encounter (INDEPENDENT_AMBULATORY_CARE_PROVIDER_SITE_OTHER): Payer: Self-pay | Admitting: Specialist

## 2018-01-08 ENCOUNTER — Telehealth (INDEPENDENT_AMBULATORY_CARE_PROVIDER_SITE_OTHER): Payer: Self-pay | Admitting: Specialist

## 2018-01-08 ENCOUNTER — Encounter (INDEPENDENT_AMBULATORY_CARE_PROVIDER_SITE_OTHER): Payer: Self-pay | Admitting: Radiology

## 2018-01-08 NOTE — Telephone Encounter (Signed)
I sent message to patient via my Chart

## 2018-01-08 NOTE — Telephone Encounter (Signed)
Patient would like to talk to you

## 2018-01-11 ENCOUNTER — Other Ambulatory Visit: Payer: Self-pay | Admitting: Urgent Care

## 2018-01-11 ENCOUNTER — Telehealth: Payer: Self-pay

## 2018-01-11 NOTE — Telephone Encounter (Signed)
Pt called to check on the status of paperwork  

## 2018-01-11 NOTE — Telephone Encounter (Signed)
Returned call and pt stated that she is having bv symptoms again. Patient stated that symptoms seem to happen around time of menstrual cycle. Advised that I would route to provider.

## 2018-01-15 ENCOUNTER — Telehealth: Payer: Self-pay

## 2018-01-15 ENCOUNTER — Other Ambulatory Visit: Payer: Self-pay | Admitting: Certified Nurse Midwife

## 2018-01-15 DIAGNOSIS — N760 Acute vaginitis: Principal | ICD-10-CM

## 2018-01-15 DIAGNOSIS — N761 Subacute and chronic vaginitis: Secondary | ICD-10-CM

## 2018-01-15 DIAGNOSIS — B9689 Other specified bacterial agents as the cause of diseases classified elsewhere: Secondary | ICD-10-CM

## 2018-01-15 MED ORDER — TERCONAZOLE 0.8 % VA CREA: 1.0000 | TOPICAL_CREAM | Freq: Every day | VAGINAL | 2 refills | Status: DC

## 2018-01-15 MED ORDER — METRONIDAZOLE 0.75 % VA GEL
1.0000 | VAGINAL | 4 refills | Status: DC
Start: 2018-01-17 — End: 2018-02-09

## 2018-01-15 MED ORDER — FLUCONAZOLE 200 MG PO TABS: 200.0000 mg | ORAL_TABLET | Freq: Once | ORAL | 2 refills | Status: AC

## 2018-01-15 NOTE — Telephone Encounter (Signed)
I called and advised her that Dr. Otelia SergeantNitka is still working on the papers, and he is doing so between patients in the office and doing surgeries.  She states that she understands that there is a lot of papers to be completed.

## 2018-01-15 NOTE — Telephone Encounter (Signed)
Pt called stating symptoms of BV are back. Spoke with Boykin Reaperachelle and she sent in Metrogel, terazole, and diflucan to pts pharmacy. Pt notified.

## 2018-01-22 ENCOUNTER — Other Ambulatory Visit: Payer: Self-pay | Admitting: Certified Nurse Midwife

## 2018-01-22 NOTE — Telephone Encounter (Signed)
I gave her metrogel earlier this month.  Is she using it?

## 2018-01-31 ENCOUNTER — Ambulatory Visit (INDEPENDENT_AMBULATORY_CARE_PROVIDER_SITE_OTHER): Payer: Medicaid Other | Admitting: Specialist

## 2018-02-09 ENCOUNTER — Encounter (HOSPITAL_COMMUNITY): Payer: Self-pay | Admitting: *Deleted

## 2018-02-09 ENCOUNTER — Ambulatory Visit (HOSPITAL_COMMUNITY)
Admission: EM | Admit: 2018-02-09 | Discharge: 2018-02-09 | Disposition: A | Discharge status: Home / Self Care | Payer: Medicaid Other | Attending: Internal Medicine | Admitting: Internal Medicine

## 2018-02-09 ENCOUNTER — Encounter: Payer: Self-pay | Admitting: Obstetrics

## 2018-02-09 ENCOUNTER — Other Ambulatory Visit: Payer: Self-pay

## 2018-02-09 DIAGNOSIS — N898 Other specified noninflammatory disorders of vagina: Secondary | ICD-10-CM | POA: Insufficient documentation

## 2018-02-09 DIAGNOSIS — E282 Polycystic ovarian syndrome: Secondary | ICD-10-CM | POA: Insufficient documentation

## 2018-02-09 DIAGNOSIS — Z3202 Encounter for pregnancy test, result negative: Secondary | ICD-10-CM

## 2018-02-09 DIAGNOSIS — N761 Subacute and chronic vaginitis: Secondary | ICD-10-CM

## 2018-02-09 DIAGNOSIS — R109 Unspecified abdominal pain: Secondary | ICD-10-CM | POA: Insufficient documentation

## 2018-02-09 DIAGNOSIS — Z791 Long term (current) use of non-steroidal anti-inflammatories (NSAID): Secondary | ICD-10-CM | POA: Insufficient documentation

## 2018-02-09 DIAGNOSIS — Z79899 Other long term (current) drug therapy: Secondary | ICD-10-CM | POA: Insufficient documentation

## 2018-02-09 DIAGNOSIS — B9689 Other specified bacterial agents as the cause of diseases classified elsewhere: Secondary | ICD-10-CM

## 2018-02-09 DIAGNOSIS — D573 Sickle-cell trait: Secondary | ICD-10-CM | POA: Insufficient documentation

## 2018-02-09 DIAGNOSIS — N760 Acute vaginitis: Secondary | ICD-10-CM

## 2018-02-09 LAB — POCT URINALYSIS DIP (DEVICE)
Bilirubin Urine: NEGATIVE
GLUCOSE, UA: NEGATIVE mg/dL
Ketones, ur: NEGATIVE mg/dL
Nitrite: NEGATIVE
PROTEIN: 100 mg/dL — AB
Specific Gravity, Urine: 1.015 (ref 1.005–1.030)
UROBILINOGEN UA: 1 mg/dL (ref 0.0–1.0)
pH: 8.5 — ABNORMAL HIGH (ref 5.0–8.0)

## 2018-02-09 LAB — POCT PREGNANCY, URINE: PREG TEST UR: NEGATIVE

## 2018-02-09 MED ORDER — METRONIDAZOLE 500 MG PO TABS: 500.0000 mg | ORAL_TABLET | Freq: Two times a day (BID) | ORAL | 0 refills | Status: AC

## 2018-02-09 MED ORDER — FLUCONAZOLE 150 MG PO TABS: 150.0000 mg | ORAL_TABLET | Freq: Once | ORAL | 0 refills | Status: AC

## 2018-02-09 MED ORDER — METRONIDAZOLE 0.75 % VA GEL: 1.0000 | VAGINAL | 4 refills | Status: DC

## 2018-02-09 NOTE — Discharge Instructions (Signed)
Please begin taking metronidazole twice daily for the next week to treat BV.  Please do not drink alcohol while taking this medicine until 24 hours after completion of last tablet.  Please begin using MetroGel prescribed by your OB/GYN twice weekly as prescribed.  May take 1 tablet of Diflucan today to treat yeast, may repeat in 2 to 3 days if symptoms persisting or at the completion of metronidazole.  We are testing you for Gonorrhea, Chlamydia, Trichomonas, Yeast and Bacterial Vaginosis. We will call you if anything is positive and let you know if you require any further treatment. Please inform partners of any positive results.   Please return if symptoms not improving with treatment, development of fever, nausea, vomiting, abdominal pain.

## 2018-02-09 NOTE — ED Provider Notes (Signed)
[facility]    CSN: [id number] Arrival date & time: 02/09/18  1325     History   Chief Complaint Chief Complaint  Patient presents with  . Vaginitis    HPI Bailey Rose is a 22 y.o. female.   History of asthma, PCOS presenting today for evaluation of vaginal discharge.  Patient states that she has had vaginal discharge off and on for the past 4 to 5 months since February.  States that she frequently has vaginal irritation and itching as well with the discharge.  States that she typically gets yeast infections just prior to starting her cycle.  Her cycle just began today, she is on low Loestrin Fe for birth control.  She believes that since switching birth controls this has initiated her symptoms.  States that she typically uses condoms when sexually active, and believes this is also been causing recurrent BV.  She has been seeing an OB/GYN who has recommended suppressive treatment, but she was unable to get the medicines due to financial reasons at the time initially prescribed.  She has had some mild abdominal pain today.  Denies dysuria, does note increased frequency, but this is not new.     Past Medical History:  Diagnosis Date  . Asthma   . Migraines   . Sickle cell trait (HCC)   . Sickle cell trait [facility](HCC)     Patient Active Problem List   Diagnosis Date Noted  . Encounter for Nexplanon removal 07/24/2017  . Breakthrough bleeding on Nexplanon 08/07/2015  . PCOS (polycystic ovarian syndrome) 03/18/2015  . Acne cystica 02/10/2015  . Dysmenorrhea 06/24/2013    Past Surgical History:  Procedure Laterality Date  . TONSILLECTOMY    . WISDOM TOOTH EXTRACTION  2017    OB History    Gravida  0   Para  0   Term  0   Preterm  0   AB  0   Living  0     SAB  0   TAB  0   Ectopic  0   Multiple  0   Live Births  0            Home Medications    Prior to Admission medications   Medication Sig Start Date End Date Taking? Authorizing  Provider  albuterol (PROVENTIL HFA;VENTOLIN HFA) 108 (90 Base) MCG/ACT inhaler Inhale 1-2 puffs into the lungs every 4 (four) hours as needed for wheezing or shortness of breath. 07/25/17   Wallis BambergMani, Mario, PA-C  cetirizine (ZYRTEC ALLERGY) 10 MG tablet Take 1 tablet (10 mg total) by mouth daily. 07/25/17   Wallis BambergMani, Mario, PA-C  etonogestrel (NEXPLANON) 68 MG IMPL implant 1 each by Subdermal route once.    [provider]  fluconazole (DIFLUCAN) 150 MG tablet Take 1 tablet (150 mg total) by mouth once for 1 dose. 02/09/18 02/09/18  Leonia Heatherly C, PA-C  fluticasone (FLONASE) 50 MCG/ACT nasal spray Place 2 sprays into both nostrils daily. 07/25/17   Wallis BambergMani, Mario, PA-C  lidocaine (LIDODERM) 5 % Place 1 patch onto the skin daily. Remove & Discard patch within 12 hours or as directed by MD 07/28/17   Petrucelli, Pleas KochSamantha R, PA-C  meloxicam (MOBIC) 7.5 MG tablet Take 1 tablet (7.5 mg total) by mouth daily. 12/13/17   Kerrin ChampagneNitka, James E, MD  metroNIDAZOLE (FLAGYL) 500 MG tablet Take 1 tablet (500 mg total) by mouth 2 (two) times daily for 7 days. 02/09/18 02/16/18  Tanaysia Bhardwaj C, PA-C  metroNIDAZOLE (METROGEL  VAGINAL) 0.75 % vaginal gel Place 1 Applicatorful vaginally 2 (two) times a week. For 4-6 months. Begin after completion of oral metronidazole 02/11/18   Zian Delair C, PA-C  naproxen (NAPROSYN) 500 MG tablet Take 1 tablet (500 mg total) by mouth 2 (two) times daily. 07/28/17   Petrucelli, Samantha R, PA-C  Norethindrone-Ethinyl Estradiol-Fe Biphas (LO LOESTRIN FE) 1 MG-10 MCG / 10 MCG tablet Take 1 tablet by mouth daily. Take 1 tablet by mouth daily. 11/29/17   Orvilla Cornwall A, CNM  terconazole (TERAZOL 3) 0.8 % vaginal cream Place 1 applicator vaginally at bedtime. 01/15/18   Roe Coombs, CNM  tinidazole (TINDAMAX) 500 MG tablet Take 4 tablets (2,000 mg total) by mouth daily with breakfast. 12/11/17   Roe Coombs, CNM    Family History Family History  Problem Relation Age of Onset  . Sickle  cell anemia Mother   . Cancer Maternal Grandmother   . Migraines Maternal Grandmother     Social History Social History   Tobacco Use  . Smoking status: Never Smoker  . Smokeless tobacco: Never Used  Substance Use Topics  . Alcohol use: No    Alcohol/week: 0.0 oz  . Drug use: No     Allergies   Patient has no known allergies.   Review of Systems Review of Systems  Constitutional: Negative for fever.  Respiratory: Negative for shortness of breath.   Cardiovascular: Negative for chest pain.  Gastrointestinal: Negative for abdominal pain, diarrhea, nausea and vomiting.  Genitourinary: Positive for vaginal discharge. Negative for dysuria, flank pain, genital sores, hematuria, menstrual problem, vaginal bleeding and vaginal pain.  Musculoskeletal: Negative for back pain.  Skin: Negative for rash.  Neurological: Negative for dizziness, light-headedness and headaches.     Physical Exam Triage Vital Signs ED Triage Vitals  Enc Vitals Group     BP 02/09/18 1339 120/74     Pulse Rate 02/09/18 1339 83     Resp 02/09/18 1339 16     Temp 02/09/18 1339 98.9 F (37.2 C)     Temp Source 02/09/18 1339 Oral     SpO2 02/09/18 1339 98 %     Weight 02/09/18 1350 140 lb (63.5 kg)     Height --      Head Circumference --      Peak Flow --      Pain Score 02/09/18 1350 0     Pain Loc --      Pain Edu? --      Excl. in GC? --    No data found.  Updated Vital Signs BP 120/74 (BP Location: Left Arm)   Pulse 83   Temp 98.9 F (37.2 C) (Oral)   Resp 16   Wt 140 lb (63.5 kg)   LMP 01/03/2018   SpO2 98%   BMI 27.34 kg/m   Visual Acuity Right Eye Distance:   Left Eye Distance:   Bilateral Distance:    Right Eye Near:   Left Eye Near:    Bilateral Near:     Physical Exam  Constitutional: She is oriented to person, place, and time. She appears well-developed and well-nourished.  No acute distress  HENT:  Head: Normocephalic and atraumatic.  Nose: Nose normal.  Eyes:  Conjunctivae are normal.  Neck: Neck supple.  Cardiovascular: Normal rate.  Pulmonary/Chest: Effort normal. No respiratory distress.  Abdominal: She exhibits no distension.  Nontender light deep palpation throughout all 4 quadrants and epigastrium  Genitourinary:  Genitourinary Comments: Deferred  Musculoskeletal: Normal range of motion.  Neurological: She is alert and oriented to person, place, and time.  Skin: Skin is warm and dry.  Psychiatric: She has a normal mood and affect.  Nursing note and vitals reviewed.    UC Treatments / Results  Labs (all labs ordered are listed, but only abnormal results are displayed) Labs Reviewed  POCT URINALYSIS DIP (DEVICE) - Abnormal; Notable for the following components:      Result Value   Hgb urine dipstick MODERATE (*)    pH 8.5 (*)    Protein, ur 100 (*)    Leukocytes, UA LARGE (*)    All other components within normal limits  URINE CULTURE  POCT PREGNANCY, URINE  CERVICOVAGINAL ANCILLARY ONLY    EKG None  Radiology No results found.  Procedures Procedures (including critical care time)  Medications Ordered in UC Medications - No data to display  Initial Impression / Assessment and Plan / UC Course  I have reviewed the triage vital signs and the nursing notes.  Pertinent labs & imaging results that were available during my care of the patient were reviewed by me and considered in my medical decision making (see chart for details).     Pregnancy test negative.  Large leuks on UA, will send with for culture, patient not having UTI symptoms.  Vaginal swab obtained, will send off to check for GC/chlamydia/trichomonas as well as yeast and BV.  We will go ahead and initiate treatment for yeast and BV today given recent positive results on past exams.  Will initiate suppressive treatment recommended by OB/GYN after completion of acute treatment today.  Follow-up with OB/GYN.Discussed strict return precautions. Patient verbalized  understanding and is agreeable with plan.  Final Clinical Impressions(s) / UC Diagnoses   Final diagnoses:  Vaginal discharge     Discharge Instructions     Please begin taking metronidazole twice daily for the next week to treat BV.  Please do not drink alcohol while taking this medicine until 24 hours after completion of last tablet.  Please begin using MetroGel prescribed by your OB/GYN twice weekly as prescribed.  May take 1 tablet of Diflucan today to treat yeast, may repeat in 2 to 3 days if symptoms persisting or at the completion of metronidazole.  We are testing you for Gonorrhea, Chlamydia, Trichomonas, Yeast and Bacterial Vaginosis. We will call you if anything is positive and let you know if you require any further treatment. Please inform partners of any positive results.   Please return if symptoms not improving with treatment, development of fever, nausea, vomiting, abdominal pain.    ED Prescriptions    Medication Sig Dispense Auth. Provider   metroNIDAZOLE (FLAGYL) 500 MG tablet Take 1 tablet (500 mg total) by mouth 2 (two) times daily for 7 days. 14 tablet Pedrohenrique Mcconville C, PA-C   fluconazole (DIFLUCAN) 150 MG tablet Take 1 tablet (150 mg total) by mouth once for 1 dose. 2 tablet Joslynne Klatt C, PA-C   metroNIDAZOLE (METROGEL VAGINAL) 0.75 % vaginal gel Place 1 Applicatorful vaginally 2 (two) times a week. For 4-6 months. Begin after completion of oral metronidazole 70 g Bernestine Holsapple C, PA-C     Controlled Substance Prescriptions  Controlled Substance Registry consulted? Not Applicable   Lew Dawes, [place] 02/09/18 1424

## 2018-02-09 NOTE — ED Triage Notes (Addendum)
Pt c/o recurring vaginal itchiness and irritation (recurring since February). Pt states that discharge is yellow and was green after using "queen V' medication. Pt states that she has had recurring yeast infections before period. Pt states she has also had abdominal pain for the last 4 days.

## 2018-02-10 LAB — URINE CULTURE

## 2018-02-11 LAB — CERVICOVAGINAL ANCILLARY ONLY
Bacterial vaginitis: POSITIVE — AB
CHLAMYDIA, DNA PROBE: NEGATIVE
Candida vaginitis: POSITIVE — AB
NEISSERIA GONORRHEA: NEGATIVE
TRICH (WINDOWPATH): NEGATIVE

## 2018-02-12 ENCOUNTER — Ambulatory Visit: Payer: Medicaid Other | Admitting: Nurse Practitioner

## 2018-03-05 DIAGNOSIS — J4520 Mild intermittent asthma, uncomplicated: Secondary | ICD-10-CM | POA: Diagnosis not present

## 2018-03-05 DIAGNOSIS — F430 Acute stress reaction: Secondary | ICD-10-CM | POA: Diagnosis not present

## 2018-03-05 DIAGNOSIS — Z309 Encounter for contraceptive management, unspecified: Secondary | ICD-10-CM | POA: Diagnosis not present

## 2018-03-05 DIAGNOSIS — R7303 Prediabetes: Secondary | ICD-10-CM | POA: Diagnosis not present

## 2018-03-05 DIAGNOSIS — Z13220 Encounter for screening for lipoid disorders: Secondary | ICD-10-CM | POA: Diagnosis not present

## 2018-03-20 ENCOUNTER — Other Ambulatory Visit (INDEPENDENT_AMBULATORY_CARE_PROVIDER_SITE_OTHER): Payer: Self-pay | Admitting: Specialist

## 2018-04-11 ENCOUNTER — Emergency Department (HOSPITAL_COMMUNITY)
Admission: EM | Admit: 2018-04-11 | Discharge: 2018-04-11 | Disposition: A | Discharge status: Home / Self Care | Payer: Self-pay | Attending: Emergency Medicine | Admitting: Emergency Medicine

## 2018-04-11 ENCOUNTER — Encounter (HOSPITAL_COMMUNITY): Payer: Self-pay

## 2018-04-11 ENCOUNTER — Other Ambulatory Visit: Payer: Self-pay

## 2018-04-11 ENCOUNTER — Emergency Department (HOSPITAL_COMMUNITY): Payer: Self-pay

## 2018-04-11 DIAGNOSIS — R0789 Other chest pain: Secondary | ICD-10-CM | POA: Insufficient documentation

## 2018-04-11 DIAGNOSIS — M546 Pain in thoracic spine: Secondary | ICD-10-CM | POA: Insufficient documentation

## 2018-04-11 DIAGNOSIS — S239XXA Sprain of unspecified parts of thorax, initial encounter: Secondary | ICD-10-CM

## 2018-04-11 DIAGNOSIS — Z79899 Other long term (current) drug therapy: Secondary | ICD-10-CM | POA: Insufficient documentation

## 2018-04-11 DIAGNOSIS — J45909 Unspecified asthma, uncomplicated: Secondary | ICD-10-CM | POA: Insufficient documentation

## 2018-04-11 MED ORDER — IBUPROFEN 800 MG PO TABS: 800.0000 mg | ORAL_TABLET | Freq: Three times a day (TID) | ORAL | 0 refills | Status: DC | PRN
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
## 2018-04-11 NOTE — Discharge Instructions (Addendum)
Return here as needed.  Your x-rays did not show any abnormalities of broken bones.  Use ice and heat on the areas that are sore.

## 2018-04-11 NOTE — ED Notes (Signed)
Patient transported to X-ray 
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
## 2018-04-16 ENCOUNTER — Other Ambulatory Visit: Payer: Self-pay

## 2018-04-16 ENCOUNTER — Encounter (HOSPITAL_COMMUNITY): Payer: Self-pay | Admitting: *Deleted

## 2018-04-16 ENCOUNTER — Ambulatory Visit (HOSPITAL_COMMUNITY)
Admission: EM | Admit: 2018-04-16 | Discharge: 2018-04-16 | Disposition: A | Discharge status: Home / Self Care | Payer: Medicaid Other | Attending: Family Medicine | Admitting: Family Medicine

## 2018-04-16 DIAGNOSIS — M545 Low back pain: Secondary | ICD-10-CM

## 2018-04-16 DIAGNOSIS — R102 Pelvic and perineal pain: Secondary | ICD-10-CM | POA: Insufficient documentation

## 2018-04-16 DIAGNOSIS — Z3202 Encounter for pregnancy test, result negative: Secondary | ICD-10-CM

## 2018-04-16 DIAGNOSIS — Z113 Encounter for screening for infections with a predominantly sexual mode of transmission: Secondary | ICD-10-CM

## 2018-04-16 DIAGNOSIS — N898 Other specified noninflammatory disorders of vagina: Secondary | ICD-10-CM

## 2018-04-16 HISTORY — DX: Polycystic ovarian syndrome: E28.2

## 2018-04-16 LAB — POCT URINALYSIS DIP (DEVICE)
Bilirubin Urine: NEGATIVE
Glucose, UA: NEGATIVE mg/dL
HGB URINE DIPSTICK: NEGATIVE
Ketones, ur: NEGATIVE mg/dL
Leukocytes, UA: NEGATIVE
Nitrite: NEGATIVE
PH: 8.5 — AB (ref 5.0–8.0)
PROTEIN: NEGATIVE mg/dL
Specific Gravity, Urine: 1.02 (ref 1.005–1.030)
Urobilinogen, UA: 0.2 mg/dL (ref 0.0–1.0)

## 2018-04-16 LAB — POCT PREGNANCY, URINE: PREG TEST UR: NEGATIVE

## 2018-04-16 MED ORDER — KETOROLAC TROMETHAMINE 30 MG/ML IJ SOLN
30.0000 mg | Freq: Once | INTRAMUSCULAR | Status: AC
  Administered 2018-04-16: 30 mg via INTRAMUSCULAR

## 2018-04-16 MED ORDER — KETOROLAC TROMETHAMINE 30 MG/ML IJ SOLN
INTRAMUSCULAR | Status: AC
  Filled 2018-04-16: qty 1

## 2018-04-16 NOTE — Discharge Instructions (Addendum)
It was nice meeting you!!  This is possibly an ovarian cyst It may resolve on its own.  Your urine was negative for infection and pregnancy.  Toradol given here for pain. For worsening pain or symptoms please go to the ER for further management.
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
## 2018-04-16 NOTE — ED Provider Notes (Signed)
[facility]    CSN: [id number] Arrival date & time: 04/16/18  1448     History   Chief Complaint Chief Complaint  Patient presents with  . Back Pain    HPI Bailey Rose is a 22 y.o. female.   Is a 22 year old female that presents with lower back pain and left pelvic pain that has been present for the past 3 days.  Her symptoms have been waxing and waning.  Describes the back pain as crampy and similar to her menstrual periods.  she reports a history of PCOS.  She does not currently have a OB/GYN that she sees regularly.  She is also having some white vaginal discharge.  No vaginal bleeding.  She denies any associated dysuria, hematuria, abdominal pain.  She is requesting testing for STDs.  She is currently sexually active and last menstrual period was 04/05/2018.  ROS per HPI      Past Medical History:  Diagnosis Date  . Asthma   . Migraines   . Polycystic ovarian syndrome   . Sickle cell trait (HCC)   . Sickle cell trait [facility])     Patient Active Problem List   Diagnosis Date Noted  . Encounter for Nexplanon removal 07/24/2017  . Breakthrough bleeding on Nexplanon 08/07/2015  . PCOS (polycystic ovarian syndrome) 03/18/2015  . Acne cystica 02/10/2015  . Dysmenorrhea 06/24/2013    Past Surgical History:  Procedure Laterality Date  . TONSILLECTOMY    . WISDOM TOOTH EXTRACTION  2017    OB History    Gravida  0   Para  0   Term  0   Preterm  0   AB  0   Living  0     SAB  0   TAB  0   Ectopic  0   Multiple  0   Live Births  0            Home Medications    Prior to Admission medications   Medication Sig Start Date End Date Taking? Authorizing Provider  albuterol (PROVENTIL HFA;VENTOLIN HFA) 108 (90 Base) MCG/ACT inhaler Inhale 1-2 puffs into the lungs every 4 (four) hours as needed for wheezing or shortness of breath. 07/25/17   Wallis Bamberg, PA-C  cetirizine (ZYRTEC ALLERGY) 10 MG tablet Take 1 tablet (10 mg total) by mouth  daily. 07/25/17   Wallis Bamberg, PA-C  etonogestrel (NEXPLANON) 68 MG IMPL implant 1 each by Subdermal route once.    [provider]  fluticasone (FLONASE) 50 MCG/ACT nasal spray Place 2 sprays into both nostrils daily. 07/25/17   Wallis Bamberg, PA-C  ibuprofen (ADVIL,MOTRIN) 800 MG tablet Take 1 tablet (800 mg total) by mouth every 8 (eight) hours as needed. 04/11/18   Lawyer, Cristal Deer, PA-C  lidocaine (LIDODERM) 5 % Place 1 patch onto the skin daily. Remove & Discard patch within 12 hours or as directed by MD 07/28/17   Petrucelli, Pleas Koch, PA-C  meloxicam (MOBIC) 7.5 MG tablet Take 1 tablet (7.5 mg total) by mouth daily. 12/13/17   Kerrin Champagne, MD  metroNIDAZOLE (METROGEL VAGINAL) 0.75 % vaginal gel Place 1 Applicatorful vaginally 2 (two) times a week. For 4-6 months. Begin after completion of oral metronidazole 02/11/18   Wieters, [employer] C, PA-C  Norethindrone-Ethinyl Estradiol-Fe Biphas (LO LOESTRIN FE) 1 MG-10 MCG / 10 MCG tablet Take 1 tablet by mouth daily. Take 1 tablet by mouth daily. 11/29/17   Orvilla Cornwall A, CNM  terconazole (TERAZOL 3) 0.8 %  vaginal cream Place 1 applicator vaginally at bedtime. 01/15/18   Roe Coombs, CNM  tinidazole (TINDAMAX) 500 MG tablet Take 4 tablets (2,000 mg total) by mouth daily with breakfast. 12/11/17   Roe Coombs, CNM    Family History Family History  Problem Relation Age of Onset  . Sickle cell anemia Mother   . Cancer Maternal Grandmother   . Migraines Maternal Grandmother     Social History Social History   Tobacco Use  . Smoking status: Never Smoker  . Smokeless tobacco: Never Used  Substance Use Topics  . Alcohol use: No    Alcohol/week: 0.0 standard drinks  . Drug use: Yes    Types: Marijuana     Allergies   Patient has no known allergies.   Review of Systems Review of Systems   Physical Exam Triage Vital Signs ED Triage Vitals  Enc Vitals Group     BP 04/16/18 1528 114/75     Pulse Rate 04/16/18  1528 82     Resp 04/16/18 1528 14     Temp 04/16/18 1528 98.6 F (37 C)     Temp Source 04/16/18 1528 Oral     SpO2 04/16/18 1528 98 %     Weight --      Height --      Head Circumference --      Peak Flow --      Pain Score 04/16/18 1530 8     Pain Loc --      Pain Edu? --      Excl. in GC? --    No data found.  Updated Vital Signs BP 114/75 (BP Location: Right Arm)   Pulse 82   Temp 98.6 F (37 C) (Oral)   Resp 14   LMP 04/05/2018 (Exact Date)   SpO2 98%   Visual Acuity Right Eye Distance:   Left Eye Distance:   Bilateral Distance:    Right Eye Near:   Left Eye Near:    Bilateral Near:     Physical Exam  Constitutional: She appears well-developed and well-nourished.  HENT:  Head: Normocephalic and atraumatic.  Eyes: Conjunctivae are normal.  Neck: Normal range of motion.  Abdominal: Soft. Bowel sounds are normal.    Tenderness to palpation of the left pelvic region.   Musculoskeletal: Normal range of motion.       Arms: Tenderness to paravertebral muscles of the lower lumbar spine. No bony tenderness.   Nursing note and vitals reviewed.    UC Treatments / Results  Labs (all labs ordered are listed, but only abnormal results are displayed) Labs Reviewed  POCT URINALYSIS DIP (DEVICE) - Abnormal; Notable for the following components:      Result Value   pH 8.5 (*)    All other components within normal limits  POCT PREGNANCY, URINE  URINE CYTOLOGY ANCILLARY ONLY    EKG None  Radiology No results found.  Procedures Procedures (including critical care time)  Medications Ordered in UC Medications  ketorolac (TORADOL) 30 MG/ML injection 30 mg (30 mg Intramuscular Given 04/16/18 1546)    Initial Impression / Assessment and Plan / UC Course  I have reviewed the triage vital signs and the nursing notes.  Pertinent labs & imaging results that were available during my care of the patient were reviewed by me and considered in my medical decision  making (see chart for details).     We will do urinalysis to rule out infection Most likely ovarian cyst  These can resolve on their own depending on size. She does not appear to be in a significant amount of pain.  Non toxic, VSS.  Toradol injection given in clinic If the pain worsens she will need further evaluation with ultrasound.  Urine negative for infection.  Monitor symptoms.  [facility] clinic for follow up If worse in the morning go to the ER for [place].   Final Clinical Impressions(s) / UC Diagnoses   Final diagnoses:  Pelvic pain     Discharge Instructions     It was nice meeting you!!  This is possibly an ovarian cyst It may resolve on its own.  Your urine was negative for infection and pregnancy.  Toradol given here for pain. For worsening pain or symptoms please go to the ER for further management.      ED Prescriptions    None     Controlled Substance Prescriptions [place] Controlled Substance Registry consulted? Not Applicable   Janace Aris, NP 04/16/18 1655

## 2018-04-17 LAB — URINE CYTOLOGY ANCILLARY ONLY
Chlamydia: NEGATIVE
Neisseria Gonorrhea: NEGATIVE
Trichomonas: NEGATIVE

## 2018-04-19 LAB — URINE CYTOLOGY ANCILLARY ONLY
BACTERIAL VAGINITIS: NEGATIVE
Candida vaginitis: NEGATIVE

## 2018-04-25 ENCOUNTER — Encounter (HOSPITAL_COMMUNITY): Payer: Self-pay

## 2018-04-25 ENCOUNTER — Inpatient Hospital Stay (HOSPITAL_COMMUNITY)
Admission: AD | Admit: 2018-04-25 | Discharge: 2018-04-25 | Disposition: A | Discharge status: Home / Self Care | Payer: Medicaid Other | Source: Ambulatory Visit | Attending: Obstetrics & Gynecology | Admitting: Obstetrics & Gynecology

## 2018-04-25 ENCOUNTER — Other Ambulatory Visit: Payer: Self-pay

## 2018-04-25 DIAGNOSIS — Z87891 Personal history of nicotine dependence: Secondary | ICD-10-CM | POA: Insufficient documentation

## 2018-04-25 DIAGNOSIS — R0602 Shortness of breath: Secondary | ICD-10-CM | POA: Insufficient documentation

## 2018-04-25 DIAGNOSIS — R102 Pelvic and perineal pain: Secondary | ICD-10-CM

## 2018-04-25 DIAGNOSIS — R42 Dizziness and giddiness: Secondary | ICD-10-CM | POA: Insufficient documentation

## 2018-04-25 DIAGNOSIS — Z3202 Encounter for pregnancy test, result negative: Secondary | ICD-10-CM | POA: Insufficient documentation

## 2018-04-25 DIAGNOSIS — R1032 Left lower quadrant pain: Secondary | ICD-10-CM | POA: Insufficient documentation

## 2018-04-25 LAB — URINALYSIS, ROUTINE W REFLEX MICROSCOPIC
Bilirubin Urine: NEGATIVE
GLUCOSE, UA: NEGATIVE mg/dL
Hgb urine dipstick: NEGATIVE
Ketones, ur: 5 mg/dL — AB
Nitrite: NEGATIVE
PH: 6 (ref 5.0–8.0)
PROTEIN: NEGATIVE mg/dL
Specific Gravity, Urine: 1.015 (ref 1.005–1.030)

## 2018-04-25 LAB — CBC
HEMATOCRIT: 38 % (ref 36.0–46.0)
Hemoglobin: 12.7 g/dL (ref 12.0–15.0)
MCH: 24 pg — ABNORMAL LOW (ref 26.0–34.0)
MCHC: 33.4 g/dL (ref 30.0–36.0)
MCV: 71.7 fL — AB (ref 80.0–100.0)
NRBC: 0 % (ref 0.0–0.2)
PLATELETS: 210 10*3/uL (ref 150–400)
RBC: 5.3 MIL/uL — AB (ref 3.87–5.11)
RDW: 15.3 % (ref 11.5–15.5)
WBC: 7.8 10*3/uL (ref 4.0–10.5)

## 2018-04-25 LAB — POCT PREGNANCY, URINE: Preg Test, Ur: NEGATIVE

## 2018-04-25 MED ORDER — KETOROLAC TROMETHAMINE 30 MG/ML IJ SOLN
30.0000 mg | Freq: Once | INTRAMUSCULAR | Status: AC
  Administered 2018-04-25: 30 mg via INTRAMUSCULAR
  Filled 2018-04-25: qty 1
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
## 2018-04-25 NOTE — Discharge Instructions (Signed)
Diet for Polycystic Ovarian Syndrome Polycystic ovary syndrome (PCOS) is a disorder of the chemical messengers (hormones) that regulate menstruation. The condition causes important hormones to be out of balance. PCOS can:  Make your periods irregular or stop.  Cause cysts to develop on the ovaries.  Make it difficult to get pregnant.  Stop your body from responding to the effects of insulin (insulin resistance), which can lead to obesity and diabetes.  Changing what you eat can help manage PCOS and improve your health. It can help you lose weight and improve the way your body uses insulin. What is my plan?  Eat breakfast, lunch, and dinner plus two snacks every day.  Include protein in each meal and snack.  Choose whole grains instead of products made with refined flour.  Eat a variety of foods.  Exercise regularly as told by your health care provider. What do I need to know about this eating plan? If you are overweight or obese, pay attention to how many calories you eat. Cutting down on calories can help you lose weight. Work with your health care provider or dietitian to figure out how many calories you need each day. What foods can I eat? Grains Whole grains, such as whole wheat. Whole-grain breads, crackers, cereals, and pasta. Unsweetened oatmeal, bulgur, barley, quinoa, or brown rice. Corn or whole-wheat flour tortillas. Vegetables  Lettuce. Spinach. Peas. Beets. Cauliflower. Cabbage. Broccoli. Carrots. Tomatoes. Squash. Eggplant. Herbs. Peppers. Onions. Cucumbers. Brussels sprouts. Fruits Berries. Bananas. Apples. Oranges. Grapes. Papaya. Mango. Pomegranate. Kiwi. Grapefruit. Cherries. Meats and Other Protein Sources Lean proteins, such as fish, chicken, beans, eggs, and tofu. Dairy Low-fat dairy products, such as skim milk, cheese sticks, and yogurt. Beverages Low-fat or fat-free drinks, such as water, low-fat milk, sugar-free drinks, and 100% fruit  juice. Condiments Ketchup. Mustard. Barbecue sauce. Relish. Low-fat or fat-free mayonnaise. Fats and Oils Olive oil or canola oil. Walnuts and almonds. The items listed above may not be a complete list of recommended foods or beverages. Contact your dietitian for more options. What foods are not recommended? Foods high in calories or fat. Fried foods. Sweets. Products made from refined white flour, including white bread, pastries, white rice, and pasta. The items listed above may not be a complete list of foods and beverages to avoid. Contact your dietitian for more information. This information is not intended to replace advice given to you by your health care provider. Make sure you discuss any questions you have with your health care provider. Document Released: 10/25/2015 Document Revised: 12/09/2015 Document Reviewed: 07/15/2014 Elsevier Interactive Patient Education  2018 [employer] Pelvic Pain, Female Pelvic pain is pain in your lower belly (abdomen), below your belly button and between your hips. The pain may start suddenly (acute), keep coming back (recurring), or last a long time (chronic). Pelvic pain that lasts longer than six months is considered chronic. There are many causes of pelvic pain. Sometimes the cause of your pelvic pain is not known. Follow these instructions at home:  Take over-the-counter and prescription medicines only as told by your doctor.  Rest as told by your doctor.  Do not have sex it if hurts.  Keep a journal of your pelvic pain. Write down: ? When the pain started. ? Where the pain is located. ? What seems to make the pain better or worse, such as food or your menstrual cycle. ? Any symptoms you have along with the pain.  Keep all follow-up visits as told by your doctor. This  is important. Contact a doctor if:  Medicine does not help your pain.  Your pain comes back.  You have new symptoms.  You have unusual vaginal discharge or  bleeding.  You have a fever or chills.  You are having a hard time pooping (constipation).  You have blood in your pee (urine) or poop (stool).  Your pee smells bad.  You feel weak or lightheaded. Get help right away if:  You have sudden pain that is very bad.  Your pain continues to get worse.  You have very bad pain and also have any of the following symptoms: ? A fever. ? Feeling stick to your stomach (nausea). ? Throwing up (vomiting). ? Being very sweaty.  You pass out (lose consciousness). This information is not intended to replace advice given to you by your health care provider. Make sure you discuss any questions you have with your health care provider. Document Released: 12/20/2007 Document Revised: 07/28/2015 Document Reviewed: 04/23/2015 Elsevier Interactive Patient Education  [employer].
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
## 2018-04-25 NOTE — MAU Note (Signed)
Pt states her pain in now a 0 but still having trouble breathing. Pt does not appear to be distressed and pulse ox was 100%. She said it feels like it's hard to get a really deep breath.

## 2018-05-04 ENCOUNTER — Encounter (HOSPITAL_COMMUNITY): Payer: Self-pay | Admitting: Emergency Medicine

## 2018-05-04 ENCOUNTER — Ambulatory Visit (HOSPITAL_COMMUNITY)
Admission: EM | Admit: 2018-05-04 | Discharge: 2018-05-04 | Disposition: A | Discharge status: Home / Self Care | Payer: Medicaid Other | Attending: Family Medicine | Admitting: Family Medicine

## 2018-05-04 ENCOUNTER — Other Ambulatory Visit: Payer: Self-pay

## 2018-05-04 DIAGNOSIS — K0889 Other specified disorders of teeth and supporting structures: Secondary | ICD-10-CM

## 2018-05-04 MED ORDER — PENICILLIN V POTASSIUM 500 MG PO TABS: 500.0000 mg | ORAL_TABLET | Freq: Four times a day (QID) | ORAL | 0 refills | Status: AC

## 2018-05-04 MED ORDER — CHLORHEXIDINE GLUCONATE 0.12 % MT SOLN: 15.0000 mL | Freq: Two times a day (BID) | OROMUCOSAL | 0 refills | Status: DC

## 2018-05-04 NOTE — ED Triage Notes (Signed)
The patient presented to the [facility] with recurrent dental pain.

## 2018-05-04 NOTE — Discharge Instructions (Signed)
Stop amoxicillin, complete course of penicillin as prescribed.  Use of mouth rinse, swish and spit twice a day.  Please follow up with dentist for definitive treatment

## 2018-05-04 NOTE — ED Provider Notes (Signed)
[facility]    CSN: [id number] Arrival date & time: 05/04/18  1625     History   Chief Complaint Chief Complaint  Patient presents with  . Dental Pain    HPI Bailey Rose is a 22 y.o. female.   Bailey Rose presents with complaints of left top front tooth pain and blister at gumline. States has had issue with this tooth for the past 2 years s/p root canal. Pain is minimal and she was able to "pop" and drain the blister. Took three left over amoxicillin. Has also taken meloxicam and ibuprofen which have helped with pain. Pain 2/10. Does not have a dentist. Hx of asthma, migraines, PCOS.    ROS per HPI.      Past Medical History:  Diagnosis Date  . Asthma   . Migraines   . Polycystic ovarian syndrome   . Sickle cell trait (HCC)   . Sickle cell trait [facility])     Patient Active Problem List   Diagnosis Date Noted  . Encounter for Nexplanon removal 07/24/2017  . Breakthrough bleeding on Nexplanon 08/07/2015  . PCOS (polycystic ovarian syndrome) 03/18/2015  . Acne cystica 02/10/2015  . Dysmenorrhea 06/24/2013    Past Surgical History:  Procedure Laterality Date  . TONSILLECTOMY    . WISDOM TOOTH EXTRACTION  2017    OB History    Gravida  0   Para  0   Term  0   Preterm  0   AB  0   Living  0     SAB  0   TAB  0   Ectopic  0   Multiple  0   Live Births  0            Home Medications    Prior to Admission medications   Medication Sig Start Date End Date Taking? Authorizing Provider  ibuprofen (ADVIL,MOTRIN) 800 MG tablet Take 1 tablet (800 mg total) by mouth every 8 (eight) hours as needed. 04/11/18  Yes Lawyer, Cristal Deer, PA-C  meloxicam (MOBIC) 7.5 MG tablet Take 1 tablet (7.5 mg total) by mouth daily. 12/13/17  Yes Kerrin Champagne, MD  chlorhexidine (PERIDEX) 0.12 % solution Use as directed 15 mLs in the mouth or throat 2 (two) times daily. Swish and spit 05/04/18   Linus Mako B, NP  penicillin v potassium (VEETID) 500 MG  tablet Take 1 tablet (500 mg total) by mouth 4 (four) times daily for 7 days. 05/04/18 05/11/18  Georgetta Haber, NP    Family History Family History  Problem Relation Age of Onset  . Sickle cell anemia Mother   . Cancer Maternal Grandmother   . Migraines Maternal Grandmother     Social History Social History   Tobacco Use  . Smoking status: Former Games developer  . Smokeless tobacco: Never Used  Substance Use Topics  . Alcohol use: No    Alcohol/week: 0.0 standard drinks  . Drug use: Yes    Types: Marijuana     Allergies   Patient has no known allergies.   Review of Systems Review of Systems   Physical Exam Triage Vital Signs ED Triage Vitals  Enc Vitals Group     BP 05/04/18 1716 116/67     Pulse Rate 05/04/18 1716 86     Resp 05/04/18 1716 16     Temp 05/04/18 1716 98.4 F (36.9 C)     Temp Source 05/04/18 1716 Oral     SpO2 05/04/18 1716 100 %  Weight --      Height --      Head Circumference --      Peak Flow --      Pain Score 05/04/18 1715 5     Pain Loc --      Pain Edu? --      Excl. in GC? --    No data found.  Updated Vital Signs BP 116/67 (BP Location: Left Arm)   Pulse 86   Temp 98.4 F (36.9 C) (Oral)   Resp 16   LMP 04/08/2018   SpO2 100%    Physical Exam  Constitutional: She is oriented to person, place, and time. She appears well-developed and well-nourished. No distress.  HENT:  Mouth/Throat: No trismus in the jaw. No dental abscesses or uvula swelling.  Redness and mild swelling posterior to tooth #9, without obvious abscess   Cardiovascular: Normal rate, regular rhythm and normal heart sounds.  Pulmonary/Chest: Effort normal and breath sounds normal.  Neurological: She is alert and oriented to person, place, and time.  Skin: Skin is warm and dry.     UC Treatments / Results  Labs (all labs ordered are listed, but only abnormal results are displayed) Labs Reviewed - No data to display  EKG None  Radiology No results  found.  Procedures Procedures (including critical care time)  Medications Ordered in UC Medications - No data to display  Initial Impression / Assessment and Plan / UC Course  I have reviewed the triage vital signs and the nursing notes.  Pertinent labs & imaging results that were available during my care of the patient were reviewed by me and considered in my medical decision making (see chart for details).     Course of veetid provided as well as peridex. Encouraged follow up with dentist for definitive treatment. Patient verbalized understanding and agreeable to plan.   Final Clinical Impressions(s) / UC Diagnoses   Final diagnoses:  Pain, dental     Discharge Instructions     Stop amoxicillin, complete course of penicillin as prescribed.  Use of mouth rinse, swish and spit twice a day.  Please follow up with dentist for definitive treatment    ED Prescriptions    Medication Sig Dispense Auth. Provider   penicillin v potassium (VEETID) 500 MG tablet Take 1 tablet (500 mg total) by mouth 4 (four) times daily for 7 days. 28 tablet Linus Mako B, NP   chlorhexidine (PERIDEX) 0.12 % solution Use as directed 15 mLs in the mouth or throat 2 (two) times daily. Swish and spit 120 mL Linus Mako B, NP     Controlled Substance Prescriptions [place] Controlled Substance Registry consulted? Not Applicable   Georgetta Haber, NP 05/04/18 1842

## 2018-05-08 ENCOUNTER — Encounter (HOSPITAL_COMMUNITY): Payer: Self-pay

## 2018-05-08 ENCOUNTER — Emergency Department (HOSPITAL_COMMUNITY)
Admission: EM | Admit: 2018-05-08 | Discharge: 2018-05-09 | Disposition: A | Discharge status: Home / Self Care | Payer: Medicaid Other | Attending: Emergency Medicine | Admitting: Emergency Medicine

## 2018-05-08 DIAGNOSIS — M545 Low back pain: Secondary | ICD-10-CM | POA: Insufficient documentation

## 2018-05-08 DIAGNOSIS — J45909 Unspecified asthma, uncomplicated: Secondary | ICD-10-CM | POA: Insufficient documentation

## 2018-05-08 DIAGNOSIS — Z87891 Personal history of nicotine dependence: Secondary | ICD-10-CM | POA: Insufficient documentation

## 2018-05-08 DIAGNOSIS — G8929 Other chronic pain: Secondary | ICD-10-CM

## 2018-05-08 DIAGNOSIS — Z79899 Other long term (current) drug therapy: Secondary | ICD-10-CM | POA: Insufficient documentation

## 2018-05-08 NOTE — ED Provider Notes (Signed)
[facility] EMERGENCY DEPARTMENT Provider Note   CSN: [id number] Arrival date & time: 05/08/18  2244     History   Chief Complaint Chief Complaint  Patient presents with  . Back Pain    HPI Bailey Rose is a 22 y.o. female.  22 y/o female with a PMH of Migraines, Sickle cell trait, Chronic back pain presents to the ED with a chief complaint of back pain x 1 week. Patient reports the back is gated along the middle of her lower back and upper back region.  She reports this pain is worse with movement.  Patient states that she was seen here in the ED a while back for the same pain and was prescribed meloxicam and this up to relieve in symptoms.  She states she has seen her primary care physician for this pain and he has referred her to a chiropractor however her insurance no longer covers the chiropractor visits.  Reports she is to follow-up with [place] [facility] for her back pain but now due to her insurance not covering her visits she resorts to the ED for her treatment.  Patient states this feels like her usual flareups of back pain, which she has been dealing with since she was released from the [employer].  Has not tried any medication for relief in her pain.  She denies any previous IV drug use, urinary retention, bowel incontinence, fever, or saddle anesthesia.      Past Medical History:  Diagnosis Date  . Asthma   . Migraines   . Polycystic ovarian syndrome   . Sickle cell trait (HCC)   . Sickle cell trait [facility])     Patient Active Problem List   Diagnosis Date Noted  . Encounter for Nexplanon removal 07/24/2017  . Breakthrough bleeding on Nexplanon 08/07/2015  . PCOS (polycystic ovarian syndrome) 03/18/2015  . Acne cystica 02/10/2015  . Dysmenorrhea 06/24/2013    Past Surgical History:  Procedure Laterality Date  . TONSILLECTOMY    . WISDOM TOOTH EXTRACTION  2017     OB History    Gravida  0   Para  0   Term  0   Preterm  0   AB    0   Living  0     SAB  0   TAB  0   Ectopic  0   Multiple  0   Live Births  0            Home Medications    Prior to Admission medications   Medication Sig Start Date End Date Taking? Authorizing Provider  chlorhexidine (PERIDEX) 0.12 % solution Use as directed 15 mLs in the mouth or throat 2 (two) times daily. Swish and spit 05/04/18   Linus Mako B, NP  ibuprofen (ADVIL,MOTRIN) 800 MG tablet Take 1 tablet (800 mg total) by mouth every 8 (eight) hours as needed. 04/11/18   Lawyer, Cristal Deer, PA-C  meloxicam (MOBIC) 7.5 MG tablet Take 1 tablet (7.5 mg total) by mouth daily for 14 days. 05/09/18 05/23/18  Claude Manges, PA-C  methocarbamol (ROBAXIN) 500 MG tablet Take 1 tablet (500 mg total) by mouth 2 (two) times daily for 7 days. 05/09/18 05/16/18  Claude Manges, PA-C  penicillin v potassium (VEETID) 500 MG tablet Take 1 tablet (500 mg total) by mouth 4 (four) times daily for 7 days. 05/04/18 05/11/18  Georgetta Haber, NP    Family History Family History  Problem Relation Age of Onset  . Sickle  cell anemia Mother   . Cancer Maternal Grandmother   . Migraines Maternal Grandmother     Social History Social History   Tobacco Use  . Smoking status: Former Games developer  . Smokeless tobacco: Never Used  Substance Use Topics  . Alcohol use: No    Alcohol/week: 0.0 standard drinks  . Drug use: Yes    Types: Marijuana     Allergies   Patient has no known allergies.   Review of Systems Review of Systems  Genitourinary: Negative for difficulty urinating.  Musculoskeletal: Positive for back pain and myalgias.  All other systems reviewed and are negative.    Physical Exam Updated Vital Signs BP 116/72   Pulse 90   Temp 98.5 F (36.9 C) (Oral)   Resp 20   SpO2 97%   Physical Exam  Constitutional: She is oriented to person, place, and time. She appears well-developed and well-nourished.  HENT:  Head: Normocephalic and atraumatic.  Neck: Normal range of  motion. Neck supple.  Cardiovascular: Normal heart sounds.  Pulmonary/Chest: Effort normal.  Abdominal: Soft.  Musculoskeletal: She exhibits tenderness.       Right shoulder: She exhibits tenderness, pain and spasm. She exhibits no bony tenderness and normal strength.       Arms: Paraspinal muscle tenderness.  Pain with palpation of the lower lumbar region.  Antalgic gait.  RLE- KF,KE 5/5 strength LLE- HF, HE 5/5 strength Normal gait. No pronator drift. No leg drop.  Patellar reflexes present and symmetric. CN I, II and VIII not tested. CN II-XII grossly intact bilaterally.     Neurological: She is alert and oriented to person, place, and time.  Skin: Skin is warm and dry.  Nursing note and vitals reviewed.    ED Treatments / Results  Labs (all labs ordered are listed, but only abnormal results are displayed) Labs Reviewed - No data to display  EKG None  Radiology No results found.  Procedures Procedures (including critical care time)  Medications Ordered in ED Medications  methocarbamol (ROBAXIN) tablet 750 mg (has no administration in time range)     Initial Impression / Assessment and Plan / ED Course  I have reviewed the triage vital signs and the nursing notes.  Pertinent labs & imaging results that were available during my care of the patient were reviewed by me and considered in my medical decision making (see chart for details).     Presents with acute onset of her chronic back pain.  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  She was seen by her PCP for this problem but reports he sent her to a chiropractor which her insurance no longer covers.  She also reports he was seen by [place] [facility] prior to her insurance changing however her insurance no longer covers these providers.  She reports meloxicam has worked in the past for her pain along with muscle relaxers.  Upon  examination there is no midline tenderness but there is paraspinal tenderness, patient exhibits no weakness, decreased range of motion, decrease in strength.  At this time I believe known imaging was necessary as patient denies any trauma, fall, recent lifting heavy objects.  At this time I will discharge patient with Robaxin along with meloxicam to help with her pain.  She is advised to return to the ED if she experiences any urinary retention, bowel incontinence, worsening in symptoms.  Patient's vitals stable for discharge,  patient stable for discharge.  Return precautions provided.     Final Clinical Impressions(s) / ED Diagnoses   Final diagnoses:  Chronic midline low back pain, unspecified whether sciatica present    ED Discharge Orders         Ordered    meloxicam (MOBIC) 7.5 MG tablet  Daily     05/09/18 0015    methocarbamol (ROBAXIN) 500 MG tablet  2 times daily     05/09/18 0015           Claude Manges, PA-C 05/09/18 0024    Pricilla Loveless, MD 05/13/18 [phone]

## 2018-05-08 NOTE — ED Triage Notes (Signed)
Pt states that she has recurrent lower back pain and today is a bad day, did not take anything for it.

## 2018-05-09 MED ORDER — METHOCARBAMOL 500 MG PO TABS: 500.0000 mg | ORAL_TABLET | Freq: Two times a day (BID) | ORAL | 0 refills | Status: AC

## 2018-05-09 MED ORDER — MELOXICAM 7.5 MG PO TABS: 7.5000 mg | ORAL_TABLET | Freq: Every day | ORAL | 0 refills | Status: AC

## 2018-05-09 MED ORDER — METHOCARBAMOL 500 MG PO TABS
750.0000 mg | ORAL_TABLET | Freq: Once | ORAL | Status: AC
  Administered 2018-05-09: 750 mg via ORAL
  Filled 2018-05-09: qty 2

## 2018-05-09 NOTE — Discharge Instructions (Signed)
I have prescribed muscle relaxers for your pain, please do not drink or drive while taking this medications as they can make you drowsy.  Please follow-up with PCP in 1 week for reevaluation of your symptoms.  You experience any bowel or bladder incontinence, fever, worsening in your symptoms please return to the ED. ° °

## 2018-05-20 DIAGNOSIS — M545 Low back pain: Secondary | ICD-10-CM | POA: Diagnosis not present

## 2018-05-20 DIAGNOSIS — J4520 Mild intermittent asthma, uncomplicated: Secondary | ICD-10-CM | POA: Diagnosis not present

## 2018-05-20 DIAGNOSIS — E282 Polycystic ovarian syndrome: Secondary | ICD-10-CM | POA: Diagnosis not present

## 2018-05-20 DIAGNOSIS — Z309 Encounter for contraceptive management, unspecified: Secondary | ICD-10-CM | POA: Diagnosis not present

## 2018-05-22 ENCOUNTER — Encounter (HOSPITAL_COMMUNITY): Payer: Self-pay | Admitting: *Deleted

## 2018-05-22 ENCOUNTER — Emergency Department (HOSPITAL_COMMUNITY)
Admission: EM | Admit: 2018-05-22 | Discharge: 2018-05-22 | Disposition: A | Discharge status: Home / Self Care | Payer: Medicaid Other | Attending: Emergency Medicine | Admitting: Emergency Medicine

## 2018-05-22 DIAGNOSIS — R52 Pain, unspecified: Secondary | ICD-10-CM

## 2018-05-22 DIAGNOSIS — R51 Headache: Secondary | ICD-10-CM | POA: Insufficient documentation

## 2018-05-22 DIAGNOSIS — Z87891 Personal history of nicotine dependence: Secondary | ICD-10-CM | POA: Insufficient documentation

## 2018-05-22 DIAGNOSIS — Z79899 Other long term (current) drug therapy: Secondary | ICD-10-CM | POA: Insufficient documentation

## 2018-05-22 DIAGNOSIS — J45909 Unspecified asthma, uncomplicated: Secondary | ICD-10-CM | POA: Insufficient documentation

## 2018-05-22 LAB — I-STAT CHEM 8, ED
BUN: 10 mg/dL (ref 6–20)
CALCIUM ION: 1.23 mmol/L (ref 1.15–1.40)
CREATININE: 0.7 mg/dL (ref 0.44–1.00)
Chloride: 105 mmol/L (ref 98–111)
GLUCOSE: 99 mg/dL (ref 70–99)
HCT: 37 % (ref 36.0–46.0)
HEMOGLOBIN: 12.6 g/dL (ref 12.0–15.0)
Potassium: 3.9 mmol/L (ref 3.5–5.1)
Sodium: 141 mmol/L (ref 135–145)
TCO2: 25 mmol/L (ref 22–32)

## 2018-05-22 LAB — I-STAT BETA HCG BLOOD, ED (MC, WL, AP ONLY)

## 2018-05-22 MED ORDER — PROCHLORPERAZINE EDISYLATE 10 MG/2ML IJ SOLN
10.0000 mg | Freq: Once | INTRAMUSCULAR | Status: AC
  Administered 2018-05-22: 10 mg via INTRAVENOUS
  Filled 2018-05-22: qty 2

## 2018-05-22 MED ORDER — KETOROLAC TROMETHAMINE 30 MG/ML IJ SOLN
15.0000 mg | Freq: Once | INTRAMUSCULAR | Status: AC
  Administered 2018-05-22: 15 mg via INTRAVENOUS
  Filled 2018-05-22: qty 1

## 2018-05-22 MED ORDER — SODIUM CHLORIDE 0.9 % IV BOLUS
1000.0000 mL | Freq: Once | INTRAVENOUS | Status: AC
  Administered 2018-05-22: 1000 mL via INTRAVENOUS

## 2018-05-22 NOTE — ED Triage Notes (Signed)
Pt in c/o migraine and pain to the left side of her body, history of same

## 2018-05-22 NOTE — ED Provider Notes (Signed)
[facility] EMERGENCY DEPARTMENT Provider Note   CSN: [id number] Arrival date & time: 05/22/18  1447     History   Chief Complaint Chief Complaint  Patient presents with  . Migraine    HPI Bailey Rose is a 22 y.o. female.  HPI Patient presents with concern of headache, left-sided pain. Patient has multiple medical issues including PCOS, headaches, chronic back pain. She notes that today she was riding a bicycle, though she does have chronic pain, and felt worsening of left-sided pain and headache. No complete loss of sensation anywhere, no syncope, no falling. No new medication taken for relief, but she notes that she has been using meloxicam recently for her back and head pain. Patient sees both a primary care physician and a chiropractor.  When she has a history of injury during a training exercise in the [employer]; she notes that she has had chronic issues since that event.  Past Medical History:  Diagnosis Date  . Asthma   . Migraines   . Polycystic ovarian syndrome   . Sickle cell trait (HCC)   . Sickle cell trait [facility])     Patient Active Problem List   Diagnosis Date Noted  . Encounter for Nexplanon removal 07/24/2017  . Breakthrough bleeding on Nexplanon 08/07/2015  . PCOS (polycystic ovarian syndrome) 03/18/2015  . Acne cystica 02/10/2015  . Dysmenorrhea 06/24/2013    Past Surgical History:  Procedure Laterality Date  . TONSILLECTOMY    . WISDOM TOOTH EXTRACTION  2017     OB History    Gravida  0   Para  0   Term  0   Preterm  0   AB  0   Living  0     SAB  0   TAB  0   Ectopic  0   Multiple  0   Live Births  0            Home Medications    Prior to Admission medications   Medication Sig Start Date End Date Taking? Authorizing Provider  chlorhexidine (PERIDEX) 0.12 % solution Use as directed 15 mLs in the mouth or throat 2 (two) times daily. Swish and spit 05/04/18   Linus Mako B, NP  ibuprofen  (ADVIL,MOTRIN) 800 MG tablet Take 1 tablet (800 mg total) by mouth every 8 (eight) hours as needed. 04/11/18   Lawyer, Cristal Deer, PA-C  meloxicam (MOBIC) 7.5 MG tablet Take 1 tablet (7.5 mg total) by mouth daily for 14 days. 05/09/18 05/23/18  Claude Manges, PA-C    Family History Family History  Problem Relation Age of Onset  . Sickle cell anemia Mother   . Cancer Maternal Grandmother   . Migraines Maternal Grandmother     Social History Social History   Tobacco Use  . Smoking status: Former Games developer  . Smokeless tobacco: Never Used  Substance Use Topics  . Alcohol use: No    Alcohol/week: 0.0 standard drinks  . Drug use: Yes    Types: Marijuana     Allergies   Patient has no known allergies.   Review of Systems Review of Systems  Constitutional:       Per HPI, otherwise negative  HENT:       Per HPI, otherwise negative  Respiratory:       Per HPI, otherwise negative  Cardiovascular:       Per HPI, otherwise negative  Gastrointestinal: Negative for vomiting.  Endocrine:       Negative aside  from HPI  Genitourinary:       Neg aside from HPI   Musculoskeletal:       Per HPI, otherwise negative  Skin: Negative.   Neurological: Positive for headaches. Negative for syncope.     Physical Exam Updated Vital Signs BP 104/77 (BP Location: Left Arm)   Pulse (!) 108   Temp 98.3 F (36.8 C) (Oral)   Resp 16   SpO2 100%   Physical Exam  Constitutional: She is oriented to person, place, and time. She appears well-developed and well-nourished. No distress.  HENT:  Head: Normocephalic and atraumatic.  Eyes: Conjunctivae and EOM are normal.  Cardiovascular: Normal rate and regular rhythm.  Pulmonary/Chest: Effort normal and breath sounds normal. No stridor. No respiratory distress.  Abdominal: She exhibits no distension.  Musculoskeletal: She exhibits no edema.  Neurological: She is alert and oriented to person, place, and time. She displays no atrophy and no  tremor. No cranial nerve deficit. She exhibits normal muscle tone. She displays no seizure activity.  Patient hesitant to fully participate in neurologic exam of lower extremities, but is no notable deficiencies.  Skin: Skin is warm and dry.  Psychiatric: She has a normal mood and affect.  Nursing note and vitals reviewed.    ED Treatments / Results  Labs (all labs ordered are listed, but only abnormal results are displayed) Labs Reviewed  I-STAT BETA HCG BLOOD, ED ([facility], [facility], [facility] ONLY)  I-STAT CHEM 8, ED    EKG None  Radiology No results found.  Procedures Procedures (including critical care time)  Medications Ordered in ED Medications  sodium chloride 0.9 % bolus 1,000 mL (has no administration in time range)  ketorolac (TORADOL) 30 MG/ML injection 15 mg (has no administration in time range)  prochlorperazine (COMPAZINE) injection 10 mg (has no administration in time range)     Initial Impression / Assessment and Plan / ED Course  I have reviewed the triage vital signs and the nursing notes.  Pertinent labs & imaging results that were available during my care of the patient were reviewed by me and considered in my medical decision making (see chart for details).    Chart review notable for 5 prior ED visits in the past 6 months.  4:30 PM Patient improved, labs reassuring, vitals reassuring. With improvement here, low suspicion for acute new pathology, some suspicion for recurrent migraine, patient discharged in stable condition.  Final Clinical Impressions(s) / ED Diagnoses  Pain   Gerhard Munch, MD 05/22/18 1630

## 2018-05-22 NOTE — Discharge Instructions (Signed)
As discussed, your evaluation today has been largely reassuring.  But, it is important that you monitor your condition carefully, and do not hesitate to return to the ED if you develop new, or concerning changes in your condition. ? ?Otherwise, please follow-up with your physician for appropriate ongoing care. ? ?

## 2018-07-27 ENCOUNTER — Emergency Department (HOSPITAL_COMMUNITY)
Admission: EM | Admit: 2018-07-27 | Discharge: 2018-07-28 | Disposition: A | Discharge status: Home / Self Care | Payer: Medicaid Other | Attending: Emergency Medicine | Admitting: Emergency Medicine

## 2018-07-27 ENCOUNTER — Encounter (HOSPITAL_COMMUNITY): Payer: Self-pay | Admitting: Emergency Medicine

## 2018-07-27 DIAGNOSIS — F10920 Alcohol use, unspecified with intoxication, uncomplicated: Secondary | ICD-10-CM | POA: Insufficient documentation

## 2018-07-27 DIAGNOSIS — F1010 Alcohol abuse, uncomplicated: Secondary | ICD-10-CM | POA: Insufficient documentation

## 2018-07-27 DIAGNOSIS — Z87891 Personal history of nicotine dependence: Secondary | ICD-10-CM | POA: Insufficient documentation

## 2018-07-27 DIAGNOSIS — J45909 Unspecified asthma, uncomplicated: Secondary | ICD-10-CM | POA: Insufficient documentation

## 2018-07-27 DIAGNOSIS — F1210 Cannabis abuse, uncomplicated: Secondary | ICD-10-CM | POA: Insufficient documentation

## 2018-07-27 DIAGNOSIS — Y906 Blood alcohol level of 120-199 mg/100 ml: Secondary | ICD-10-CM | POA: Insufficient documentation

## 2018-07-27 NOTE — ED Triage Notes (Signed)
Per EMS pt. From friend's house having a party , complaint of  ETOH  At around 10pm this evening , reported nausea and vomiting.

## 2018-07-27 NOTE — ED Notes (Signed)
Bed: WA02 Expected date:  Expected time:  Means of arrival:  Comments: EMS 23 yo female intoxicated-IVF's

## 2018-07-28 ENCOUNTER — Other Ambulatory Visit: Payer: Self-pay

## 2018-07-28 LAB — RAPID URINE DRUG SCREEN, HOSP PERFORMED
Amphetamines: NOT DETECTED
BARBITURATES: NOT DETECTED
BENZODIAZEPINES: NOT DETECTED
Cocaine: NOT DETECTED
Opiates: NOT DETECTED
TETRAHYDROCANNABINOL: POSITIVE — AB

## 2018-07-28 LAB — PREGNANCY, URINE: Preg Test, Ur: NEGATIVE

## 2018-07-28 LAB — ETHANOL: ALCOHOL ETHYL (B): 134 mg/dL — AB (ref <10)

## 2018-07-28 MED ORDER — ONDANSETRON HCL 4 MG PO TABS: 4.0000 mg | ORAL_TABLET | Freq: Three times a day (TID) | ORAL | 0 refills | Status: DC | PRN

## 2018-07-28 MED ORDER — ONDANSETRON 4 MG PO TBDP
4.0000 mg | ORAL_TABLET | Freq: Once | ORAL | Status: AC
  Administered 2018-07-28: 4 mg via ORAL
  Filled 2018-07-28: qty 1

## 2018-07-28 NOTE — ED Notes (Signed)
Patient walked to bathroom without any assistance and did having any difficulty walking. Patient is drinking gingerale and does not have any problem with drinking. Patient currently is not nauseated.

## 2018-07-28 NOTE — ED Provider Notes (Signed)
Emergency Department Provider Note   I have reviewed the triage vital signs and the nursing notes.   HISTORY  Chief Complaint Alcohol Intoxication   HPI Bailey Rose is a 23 y.o. female otherwise healthy female who presents the emergency department today for vomiting and alcohol intoxication.  The family states that they were called by the patient's friend that she drank too much and she was really sick.  They showed up and the patient did not look very well but no one else seemed concerned so they brought her here for further evaluation.  With family out of the room the patient only admits to alcohol and states that she does not do any other drugs and does not feel that she was poisoned.  She says she can eat much today and she drank quite a bit and thinks she just was too intoxicated.  No abdominal pain, fever or recent illnesses.  No sick contacts. No other associated or modifying symptoms.    Past Medical History:  Diagnosis Date  . Asthma   . Migraines   . Polycystic ovarian syndrome   . Sickle cell trait (HCC)   . Sickle cell trait [facility])     Patient Active Problem List   Diagnosis Date Noted  . Encounter for Nexplanon removal 07/24/2017  . Breakthrough bleeding on Nexplanon 08/07/2015  . PCOS (polycystic ovarian syndrome) 03/18/2015  . Acne cystica 02/10/2015  . Dysmenorrhea 06/24/2013    Past Surgical History:  Procedure Laterality Date  . TONSILLECTOMY    . WISDOM TOOTH EXTRACTION  2017    Current Outpatient Rx  . Order #: [id number] Class: Normal  . Order #: [id number] Class: Print  . Order #: [id number] Class: Print    Allergies Patient has no known allergies.  Family History  Problem Relation Age of Onset  . Sickle cell anemia Mother   . Cancer Maternal Grandmother   . Migraines Maternal Grandmother     Social History Social History   Tobacco Use  . Smoking status: Former Games developer  . Smokeless tobacco: Never Used  Substance Use Topics  .  Alcohol use: No    Alcohol/week: 0.0 standard drinks  . Drug use: Yes    Types: Marijuana    Review of Systems  All other systems negative except as documented in the HPI. All pertinent positives and negatives as reviewed in the HPI. ____________________________________________   PHYSICAL EXAM:  VITAL SIGNS: ED Triage Vitals  Enc Vitals Group     BP 07/27/18 2344 94/62     Pulse Rate 07/27/18 2344 89     Resp 07/27/18 2344 16     Temp 07/27/18 2344 97.7 F (36.5 C)     Temp Source 07/27/18 2344 Oral     SpO2 07/27/18 2333 98 %     Weight 07/28/18 0247 140 lb (63.5 kg)     Height 07/28/18 0247 5' (1.524 m)     Head Circumference --      Peak Flow --      Pain Score 07/28/18 0100 0     Pain Loc --      Pain Edu? --      Excl. in GC? --     Constitutional: Alert and oriented. Well appearing and in no acute distress. Eyes: Conjunctivae are normal. PERRL. EOMI. Head: Atraumatic. Nose: No congestion/rhinnorhea. Mouth/Throat: Mucous membranes are moist.  Oropharynx non-erythematous. Neck: No stridor.  No meningeal signs.   Cardiovascular: Normal rate, regular rhythm. Good peripheral circulation. Grossly normal heart  sounds.   Respiratory: Normal respiratory effort.  No retractions. Lungs CTAB. Gastrointestinal: Soft and nontender. No distention.  Musculoskeletal: No lower extremity tenderness nor edema. No gross deformities of extremities. Neurologic:  Normal speech and language. No gross focal neurologic deficits are appreciated.  Skin:  Skin is warm, dry and intact. No rash noted.   ____________________________________________   LABS (all labs ordered are listed, but only abnormal results are displayed)  Labs Reviewed  RAPID URINE DRUG SCREEN, HOSP PERFORMED - Abnormal; Notable for the following components:      Result Value   Tetrahydrocannabinol POSITIVE (*)    All other components within normal limits  ETHANOL - Abnormal; Notable for the following components:     Alcohol, Ethyl (B) 134 (*)    All other components within normal limits  PREGNANCY, URINE   ____________________________________________    INITIAL IMPRESSION / ASSESSMENT AND PLAN / ED COURSE  Patient tolerating p.o. and ambulate without difficulty.  Also is going home with her mother.  Feel she is safe for discharge to the care of her mother.  No evidence of trauma or other more emergent causes for symptoms.     Pertinent labs & imaging results that were available during my care of the patient were reviewed by me and considered in my medical decision making (see chart for details).  ____________________________________________  FINAL CLINICAL IMPRESSION(S) / ED DIAGNOSES  Final diagnoses:  Acute alcoholic intoxication without complication (HCC)     MEDICATIONS GIVEN DURING THIS VISIT:  Medications  ondansetron (ZOFRAN-ODT) disintegrating tablet 4 mg (4 mg Oral Given 07/28/18 0202)     NEW OUTPATIENT MEDICATIONS STARTED DURING THIS VISIT:  Discharge Medication List as of 07/28/2018  2:49 AM    START taking these medications   Details  ondansetron (ZOFRAN) 4 MG tablet Take 1 tablet (4 mg total) by mouth every 8 (eight) hours as needed for nausea or vomiting., Starting Sun 07/28/2018, Print        Note:  This note was prepared with assistance of Dragon voice recognition software. Occasional wrong-word or sound-a-like substitutions may have occurred due to the inherent limitations of voice recognition software.   Lavel Rieman, Barbara Cower, MD 07/28/18 [phone]

## 2018-08-18 ENCOUNTER — Encounter (HOSPITAL_COMMUNITY): Payer: Self-pay

## 2018-08-18 ENCOUNTER — Inpatient Hospital Stay (HOSPITAL_COMMUNITY)
Admission: AD | Admit: 2018-08-18 | Discharge: 2018-08-18 | Discharge status: Against Medical Advice | Payer: Medicaid Other | Attending: Obstetrics & Gynecology | Admitting: Obstetrics & Gynecology

## 2018-08-18 DIAGNOSIS — R0602 Shortness of breath: Secondary | ICD-10-CM | POA: Insufficient documentation

## 2018-08-18 DIAGNOSIS — Z5321 Procedure and treatment not carried out due to patient leaving prior to being seen by health care provider: Secondary | ICD-10-CM | POA: Insufficient documentation

## 2018-08-18 DIAGNOSIS — N898 Other specified noninflammatory disorders of vagina: Secondary | ICD-10-CM | POA: Insufficient documentation

## 2018-08-18 DIAGNOSIS — M542 Cervicalgia: Secondary | ICD-10-CM | POA: Insufficient documentation

## 2018-08-18 LAB — URINALYSIS, ROUTINE W REFLEX MICROSCOPIC
Bilirubin Urine: NEGATIVE
Glucose, UA: NEGATIVE mg/dL
Hgb urine dipstick: NEGATIVE
Ketones, ur: NEGATIVE mg/dL
Leukocytes, UA: NEGATIVE
Nitrite: NEGATIVE
Protein, ur: NEGATIVE mg/dL
SPECIFIC GRAVITY, URINE: 1.015 (ref 1.005–1.030)
pH: 7.5 (ref 5.0–8.0)

## 2018-08-18 LAB — POCT PREGNANCY, URINE: Preg Test, Ur: POSITIVE — AB

## 2018-08-18 NOTE — MAU Note (Signed)
3rd call, not in lobby

## 2018-08-18 NOTE — MAU Note (Signed)
Bailey Rose is a 23 y.o. here in MAU reporting: pt states she has been having shoulder, back, and neck pain intermittently, none right now. Reports she is having SOB. States it hurts when she takes a deep breath. + UPT 01/26. No vaginal bleeding. Increased vaginal discharge. Pt has hx of PCOS  LMP: 07/15/18  Onset of complaint: ongoing for 3 days  Pain score: 9/10  Vitals:   08/18/18 1132  BP: 106/67  Pulse: (!) 105  Resp: 18  Temp: 98.3 F (36.8 C)  SpO2: 100%      Lab orders placed from triage: UA and UPT

## 2018-08-18 NOTE — MAU Note (Signed)
First call, not in lobby

## 2018-08-18 NOTE — MAU Note (Signed)
2nd call, pt not in lobby

## 2018-08-20 DIAGNOSIS — Z0000 Encounter for general adult medical examination without abnormal findings: Secondary | ICD-10-CM | POA: Diagnosis not present

## 2018-08-20 DIAGNOSIS — M545 Low back pain: Secondary | ICD-10-CM | POA: Diagnosis not present

## 2018-08-20 DIAGNOSIS — Z309 Encounter for contraceptive management, unspecified: Secondary | ICD-10-CM | POA: Diagnosis not present

## 2018-08-20 DIAGNOSIS — Z331 Pregnant state, incidental: Secondary | ICD-10-CM | POA: Diagnosis not present

## 2018-08-20 DIAGNOSIS — G43909 Migraine, unspecified, not intractable, without status migrainosus: Secondary | ICD-10-CM | POA: Diagnosis not present

## 2018-08-20 DIAGNOSIS — R7303 Prediabetes: Secondary | ICD-10-CM | POA: Diagnosis not present

## 2019-01-28 DIAGNOSIS — Z3009 Encounter for other general counseling and advice on contraception: Secondary | ICD-10-CM | POA: Diagnosis not present

## 2019-01-28 DIAGNOSIS — Z0389 Encounter for observation for other suspected diseases and conditions ruled out: Secondary | ICD-10-CM | POA: Diagnosis not present

## 2019-01-28 DIAGNOSIS — Z1388 Encounter for screening for disorder due to exposure to contaminants: Secondary | ICD-10-CM | POA: Diagnosis not present

## 2019-03-13 ENCOUNTER — Other Ambulatory Visit: Payer: Self-pay

## 2019-03-13 ENCOUNTER — Emergency Department (HOSPITAL_COMMUNITY)
Admission: EM | Admit: 2019-03-13 | Discharge: 2019-03-13 | Disposition: A | Discharge status: Home / Self Care | Payer: Medicaid Other | Attending: Emergency Medicine | Admitting: Emergency Medicine

## 2019-03-13 ENCOUNTER — Emergency Department (HOSPITAL_COMMUNITY): Payer: Medicaid Other

## 2019-03-13 ENCOUNTER — Other Ambulatory Visit: Payer: Self-pay | Admitting: Obstetrics and Gynecology

## 2019-03-13 ENCOUNTER — Encounter (HOSPITAL_COMMUNITY): Payer: Self-pay

## 2019-03-13 ENCOUNTER — Telehealth: Payer: Self-pay | Admitting: *Deleted

## 2019-03-13 DIAGNOSIS — Z3A01 Less than 8 weeks gestation of pregnancy: Secondary | ICD-10-CM | POA: Insufficient documentation

## 2019-03-13 DIAGNOSIS — O219 Vomiting of pregnancy, unspecified: Secondary | ICD-10-CM | POA: Insufficient documentation

## 2019-03-13 DIAGNOSIS — Z3201 Encounter for pregnancy test, result positive: Secondary | ICD-10-CM

## 2019-03-13 DIAGNOSIS — J45909 Unspecified asthma, uncomplicated: Secondary | ICD-10-CM | POA: Diagnosis not present

## 2019-03-13 DIAGNOSIS — O26891 Other specified pregnancy related conditions, first trimester: Secondary | ICD-10-CM | POA: Diagnosis not present

## 2019-03-13 DIAGNOSIS — Z87891 Personal history of nicotine dependence: Secondary | ICD-10-CM | POA: Diagnosis not present

## 2019-03-13 DIAGNOSIS — K5900 Constipation, unspecified: Secondary | ICD-10-CM | POA: Diagnosis not present

## 2019-03-13 DIAGNOSIS — O99511 Diseases of the respiratory system complicating pregnancy, first trimester: Secondary | ICD-10-CM | POA: Diagnosis not present

## 2019-03-13 DIAGNOSIS — R102 Pelvic and perineal pain: Secondary | ICD-10-CM | POA: Diagnosis not present

## 2019-03-13 DIAGNOSIS — O3680X0 Pregnancy with inconclusive fetal viability, not applicable or unspecified: Secondary | ICD-10-CM

## 2019-03-13 LAB — CBC
HCT: 40.1 % (ref 36.0–46.0)
Hemoglobin: 12.9 g/dL (ref 12.0–15.0)
MCH: 23.2 pg — ABNORMAL LOW (ref 26.0–34.0)
MCHC: 32.2 g/dL (ref 30.0–36.0)
MCV: 72.3 fL — ABNORMAL LOW (ref 80.0–100.0)
Platelets: 203 10*3/uL (ref 150–400)
RBC: 5.55 MIL/uL — ABNORMAL HIGH (ref 3.87–5.11)
RDW: 16.6 % — ABNORMAL HIGH (ref 11.5–15.5)
WBC: 9.5 10*3/uL (ref 4.0–10.5)
nRBC: 0 % (ref 0.0–0.2)

## 2019-03-13 LAB — POC URINE PREG, ED: Preg Test, Ur: POSITIVE — AB

## 2019-03-13 LAB — COMPREHENSIVE METABOLIC PANEL
ALT: 11 U/L (ref 0–44)
AST: 15 U/L (ref 15–41)
Albumin: 4.2 g/dL (ref 3.5–5.0)
Alkaline Phosphatase: 52 U/L (ref 38–126)
Anion gap: 10 (ref 5–15)
BUN: 11 mg/dL (ref 6–20)
CO2: 22 mmol/L (ref 22–32)
Calcium: 9.3 mg/dL (ref 8.9–10.3)
Chloride: 106 mmol/L (ref 98–111)
Creatinine, Ser: 0.62 mg/dL (ref 0.44–1.00)
GFR calc Af Amer: >60 mL/min (ref >60)
GFR calc non Af Amer: >60 mL/min (ref >60)
Glucose, Bld: 87 mg/dL (ref 70–99)
Potassium: 3.7 mmol/L (ref 3.5–5.1)
Sodium: 138 mmol/L (ref 135–145)
Total Bilirubin: 0.2 mg/dL — ABNORMAL LOW (ref 0.3–1.2)
Total Protein: 7.5 g/dL (ref 6.5–8.1)

## 2019-03-13 LAB — URINALYSIS, ROUTINE W REFLEX MICROSCOPIC
Bilirubin Urine: NEGATIVE
Glucose, UA: NEGATIVE mg/dL
Hgb urine dipstick: NEGATIVE
Ketones, ur: NEGATIVE mg/dL
Leukocytes,Ua: NEGATIVE
Nitrite: NEGATIVE
Protein, ur: NEGATIVE mg/dL
Specific Gravity, Urine: 1.012 (ref 1.005–1.030)
pH: 6 (ref 5.0–8.0)

## 2019-03-13 LAB — WET PREP, GENITAL
Clue Cells Wet Prep HPF POC: NONE SEEN
Sperm: NONE SEEN
Trich, Wet Prep: NONE SEEN
Yeast Wet Prep HPF POC: NONE SEEN

## 2019-03-13 LAB — ABO/RH: ABO/RH(D): O NEG

## 2019-03-13 LAB — LIPASE, BLOOD: Lipase: 28 U/L (ref 11–51)

## 2019-03-13 LAB — RPR: RPR Ser Ql: NONREACTIVE

## 2019-03-13 LAB — HCG, QUANTITATIVE, PREGNANCY: hCG, Beta Chain, Quant, S: 4299 m[IU]/mL — ABNORMAL HIGH (ref <5)

## 2019-03-13 MED ORDER — DOCUSATE SODIUM 100 MG PO CAPS: 100.0000 mg | ORAL_CAPSULE | Freq: Two times a day (BID) | ORAL | 0 refills | Status: DC | PRN

## 2019-03-13 MED ORDER — SODIUM CHLORIDE 0.9% FLUSH
3.0000 mL | Freq: Once | INTRAVENOUS | Status: AC
  Administered 2019-03-13: 3 mL via INTRAVENOUS

## 2019-03-13 NOTE — ED Notes (Signed)
US at bedside

## 2019-03-13 NOTE — Discharge Instructions (Addendum)
You were seen in the emergency department today for pelvic pain and constipation.  Your pregnancy test was positive. Your ultrasound was somewhat inconclusive, it is difficult to tell if this is a normal pregnancy within the uterus or an ectopic pregnancy, a dangerous pregnancy within your ovary area.  Given the ultrasound was inconclusive it is extremely important that you follow-up with women's for a repeat ultrasound.  Our radiology department will be calling you to schedule the repeat ultrasound, we would also like you to call women's for a follow-up appointment.  These follow-up should be within 1 week.  We are sending home with Colace to take as needed for constipation.  We have prescribed you new medication(s) today. Discuss the medications prescribed today with your pharmacist as they can have adverse effects and interactions with your other medicines including over the counter and prescribed medications. Seek medical evaluation if you start to experience new or abnormal symptoms after taking one of these medicines, seek care immediately if you start to experience difficulty breathing, feeling of your throat closing, facial swelling, or rash as these could be indications of a more serious allergic reaction  Please follow attached diet guidelines.  Please start taking prenatal vitamins, discussed with pharmacy.  Please take Tylenol for over-the-counter dosing to help with pain as it is safe in pregnancy.   Do not drink alcohol or do drugs. Follow-up as discussed above.  Return to the ER or to the MAU immediately should you experience new or worsening symptoms including but not limited to recurrence of severe pain, persistent pain, passing out, inability to keep fluids down, or any other concerns.

## 2019-03-13 NOTE — ED Notes (Signed)
ED Provider at bedside. 

## 2019-03-13 NOTE — ED Triage Notes (Signed)
Patient c/o mid lower abdominal pain x 2-3 days. Patient had a home pregnancy test yesterday that was positive.  Patient states she has not had a normal BM x 1 week. Patient states she had a small BM yesterday.

## 2019-03-13 NOTE — Telephone Encounter (Signed)
-----   Message from Mora Bellman, MD sent at 03/13/2019 12:51 PM EDT ----- Patient seen in ED on 03/13/2019. Patient will be scheduled for viability ultrasound outpatient (order in Epic, please schedule 7-10 days from 03/13/2019) and needs to be scheduled to discuss results as an RN visit please on that day  Thanks  Peggy

## 2019-03-13 NOTE — ED Provider Notes (Signed)
[facility]-EMERGENCY DEPT Provider Note   CSN: [id number] Arrival date & time: 03/13/19  [id number]     History   Chief Complaint Chief Complaint  Patient presents with   Abdominal Pain   Constipation    HPI Bailey Parcellexis Rose is a 23 y.o. female with a hx of PCOS, migraines, & sickle cell trait who presents to the ED following positive pregnancy test with complaints of constipation x 1 week & intermittent abdominal pain x 3 days.  Patient reports that she had a positive pregnancy test at home, LMP 02/07/19, with this positive test she is G2P0A1, prior miscarriage.  She states that she has had issues with constipation for the past 1 week, she did have a bowel movement yesterday that was fairly small/firm, prior to this her last bowel movement was about a week ago.  She also complains of intermittent pelvic pain bilaterally (R>L).  She states the pain is sharp in nature, comes and goes, and lasts a few minutes to about an hour at a time.  No alleviating or aggravating factors.  She has had some nausea without vomiting.  Denies fever, chills, emesis, diarrhea, melena, hematochezia, dysuria, urgency, hematuria, vaginal bleeding, or vaginal discharge.  She is sexually active with 1 female partner without concern for STDs.     HPI  Past Medical History:  Diagnosis Date   Asthma    Migraines    Polycystic ovarian syndrome    Sickle cell trait (HCC)    Sickle cell trait [facility](HCC)     Patient Active Problem List   Diagnosis Date Noted   Encounter for Nexplanon removal 07/24/2017   Breakthrough bleeding on Nexplanon 08/07/2015   PCOS (polycystic ovarian syndrome) 03/18/2015   Acne cystica 02/10/2015   Dysmenorrhea 06/24/2013    Past Surgical History:  Procedure Laterality Date   TONSILLECTOMY     WISDOM TOOTH EXTRACTION  2017     OB History    Gravida  1   Para  0   Term  0   Preterm  0   AB  0   Living  0     SAB  0   TAB  0   Ectopic  0    Multiple  0   Live Births  0            Home Medications    Prior to Admission medications   Medication Sig Start Date End Date Taking? Authorizing Provider  chlorhexidine (PERIDEX) 0.12 % solution Use as directed 15 mLs in the mouth or throat 2 (two) times daily. Swish and spit 05/04/18   Linus MakoBurky, Natalie B, NP  ibuprofen (ADVIL,MOTRIN) 800 MG tablet Take 1 tablet (800 mg total) by mouth every 8 (eight) hours as needed. 04/11/18   Lawyer, Cristal Deerhristopher, PA-C  ondansetron (ZOFRAN) 4 MG tablet Take 1 tablet (4 mg total) by mouth every 8 (eight) hours as needed for nausea or vomiting. 07/28/18   Mesner, Barbara CowerJason, MD    Family History Family History  Problem Relation Age of Onset   Sickle cell anemia Mother    Cancer Maternal Grandmother    Migraines Maternal Grandmother     Social History Social History   Tobacco Use   Smoking status: Former Smoker   Smokeless tobacco: Never Used  Substance Use Topics   Alcohol use: No    Alcohol/week: 0.0 standard drinks   Drug use: Not Currently    Types: Marijuana     Allergies   Patient has  no known allergies.   Review of Systems Review of Systems  Constitutional: Negative for chills and fever.  Respiratory: Negative for shortness of breath.   Cardiovascular: Negative for chest pain.  Gastrointestinal: Positive for constipation and nausea. Negative for abdominal distention, anal bleeding, blood in stool, diarrhea and vomiting.  Genitourinary: Positive for pelvic pain. Negative for dysuria, hematuria, vaginal bleeding and vaginal discharge.  Neurological: Negative for syncope.  All other systems reviewed and are negative.    Physical Exam Updated Vital Signs BP 114/72 (BP Location: Left Arm)    Pulse 85    Temp 99 F (37.2 C) (Oral)    Resp 16    Ht 5' (1.524 m)    Wt 75.5 kg    LMP 02/07/2019    SpO2 100%    BMI 32.51 kg/m   Physical Exam Vitals signs and nursing note reviewed. Exam conducted with a chaperone  present.  Constitutional:      General: She is not in acute distress.    Appearance: She is well-developed. She is not toxic-appearing.  HENT:     Head: Normocephalic and atraumatic.  Eyes:     General:        Right eye: No discharge.        Left eye: No discharge.     Conjunctiva/sclera: Conjunctivae normal.  Neck:     Musculoskeletal: Neck supple.  Cardiovascular:     Rate and Rhythm: Normal rate and regular rhythm.  Pulmonary:     Effort: Pulmonary effort is normal. No respiratory distress.     Breath sounds: Normal breath sounds. No wheezing, rhonchi or rales.  Abdominal:     General: There is no distension.     Palpations: Abdomen is soft.     Tenderness: There is abdominal tenderness in the right lower quadrant, suprapubic area and left lower quadrant. There is no guarding or rebound. Negative signs include Rovsing's sign, psoas sign and obturator sign.  Genitourinary:    Exam position: Supine.     Labia:        Right: No lesion.        Left: No lesion.      Vagina: No bleeding.     Cervix: No friability.     Comments: EDT Melodye Ped present as chaperone.  Diffuse tenderness throughout bimanual exam with most prominent being to R adnexal region. No palpable masses.  Skin:    General: Skin is warm and dry.     Findings: No rash.  Neurological:     Mental Status: She is alert.     Comments: Clear speech.   Psychiatric:        Behavior: Behavior normal.    ED Treatments / Results  Labs (all labs ordered are listed, but only abnormal results are displayed) Labs Reviewed  WET PREP, GENITAL - Abnormal; Notable for the following components:      Result Value   WBC, Wet Prep HPF POC MODERATE (*)    All other components within normal limits  COMPREHENSIVE METABOLIC PANEL - Abnormal; Notable for the following components:   Total Bilirubin 0.2 (*)    All other components within normal limits  CBC - Abnormal; Notable for the following components:   RBC 5.55 (*)     MCV 72.3 (*)    MCH 23.2 (*)    RDW 16.6 (*)    All other components within normal limits  URINALYSIS, ROUTINE W REFLEX MICROSCOPIC - Abnormal; Notable for the following components:  APPearance HAZY (*)    All other components within normal limits  HCG, QUANTITATIVE, PREGNANCY - Abnormal; Notable for the following components:   hCG, Beta Chain, Quant, S 4,299 (*)    All other components within normal limits  POC URINE PREG, ED - Abnormal; Notable for the following components:   Preg Test, Ur POSITIVE (*)    All other components within normal limits  LIPASE, BLOOD  RPR  HIV ANTIBODY (ROUTINE TESTING W REFLEX)  ABO/RH  RH IG WORKUP (INCLUDES ABO/RH)  GC/CHLAMYDIA PROBE AMP ([place]) NOT [facility]    EKG None  Radiology [place] Ob Comp < 14 Wks  Result Date: 03/13/2019 CLINICAL DATA:  Pelvic pain for 3 days EXAM: OBSTETRIC <14 WK US AND TRANSVAGINAL OB US TECHNIQUE: Both transabdominal and transvaginal ultrasound examinations were performed for complete evaluation of the gestation as well as the maternal uterus, adnexal regions, and pelvic cul-de-sac. Transvaginal technique was performed to assess early pregnancy. COMPARISON:  12/06/2017 FINDINGS: Intrauterine gestational sac: Single Yolk sac:  Not Visualized. Embryo:  Not Visualized. Cardiac Activity: Not Visualized. MSD: 4.5 mm   5 w   2 d US EDC: 11/11/2019 Subchorionic hemorrhage:  None visualized. Maternal uterus/adnexae: Within normal limits. No abnormal adnexal lesions. IMPRESSION: Single intrauterine gestational sac measuring 5 weeks 2 days by mean sac diameter. No fetal pole or yolk sac identified at this time. Differential considerations include: Pregnancy too early to characterize, blighted ovum, or pseudo gestational sac from ectopic pregnancy. Recommend close clinical follow-up, short interval repeat exam, and serial beta HCG levels. Electronically Signed   By: Duanne GuessNicholas  Plundo M.D.   On: 03/13/2019 11:40   [place] Ob  Transvaginal  Result Date: 03/13/2019 CLINICAL DATA:  Pelvic pain for 3 days EXAM: OBSTETRIC <14 WK US AND TRANSVAGINAL OB US TECHNIQUE: Both transabdominal and transvaginal ultrasound examinations were performed for complete evaluation of the gestation as well as the maternal uterus, adnexal regions, and pelvic cul-de-sac. Transvaginal technique was performed to assess early pregnancy. COMPARISON:  12/06/2017 FINDINGS: Intrauterine gestational sac: Single Yolk sac:  Not Visualized. Embryo:  Not Visualized. Cardiac Activity: Not Visualized. MSD: 4.5 mm   5 w   2 d US EDC: 11/11/2019 Subchorionic hemorrhage:  None visualized. Maternal uterus/adnexae: Within normal limits. No abnormal adnexal lesions. IMPRESSION: Single intrauterine gestational sac measuring 5 weeks 2 days by mean sac diameter. No fetal pole or yolk sac identified at this time. Differential considerations include: Pregnancy too early to characterize, blighted ovum, or pseudo gestational sac from ectopic pregnancy. Recommend close clinical follow-up, short interval repeat exam, and serial beta HCG levels. Electronically Signed   By: Duanne GuessNicholas  Plundo M.D.   On: 03/13/2019 11:40    Procedures Procedures (including critical care time)  Medications Ordered in ED Medications  sodium chloride flush (NS) 0.9 % injection 3 mL (has no administration in time range)     Initial Impression / Assessment and Plan / ED Course  I have reviewed the triage vital signs and the nursing notes.  Pertinent labs & imaging results that were available during my care of the patient were reviewed by me and considered in my medical decision making (see chart for details).   Patient w/ positive pregnancy test @ home presents to the ED w/ complaints of constipation & intermittent pelvic pain (R>L). She is nontoxic appearing, in no apparent distress, vitals WNL. Exam w/ suprapubic/lower abdominal tenderness as well as diffuse tenderness on bimanual exam which seems  most prominent on the R  adnexal region. No significant vaginal bleeding or discharge. DDX: pelvic pain in pregnancy, ectopic pregnancy, ovarian cyst, ovarian torsion, constipation. Feel that obstruction/perf are less likely given no peritoneal signs & no prior abdominal surgeries. Does not seem most focal of mcburneys point, seems more pelvic, doubt appendicitis. No vaginal discharge, patient w/o concern for STDs, PID seems less likely. Plan to proceed w/ labs & [place].  CBC: No leukocytosis, hemoglobin/hematocrit within normal limits CMP: Fairly unremarkable, renal function LFTs WNL, no significant electrolyte derangement Lipase: Within normal limits Urinalysis: No signs of UTI. Wet prep, moderate WBCs, no trichomoniasis, BV, or yeast. Pregnancy test: Positive--> will obtain hCG quantitative and ABO/Rh. STD tests are pending including GC/chlamydia/RPR/HIV.  [place]: Single intrauterine gestational sac measuring 5 weeks 2 days by mean sac diameter. No fetal pole or yolk sac identified at this time. Differential considerations include: Pregnancy too early to characterize, blighted ovum, or pseudo gestational sac from ectopic pregnancy. Recommend close clinical follow-up, short interval repeat exam, and serial beta HCG levels  12:38: CONSULT: Discussed with OBGYN Dr. Jodi Marble will arrange for repeat ultrasound in 1 week as well as OB/GYN follow-up, requesting we provide patient with women's outpatient clinic information, recommends discharge home with this close follow-up.  Also discussed patient's constipation, recommends diet recommendations with plenty of water with use of colace vs. Metamucil.   Patient feeling improved throughout her ER stay, she is tolerating p.o., will discharge home per discussion with OB/GYN.  Will provide prescription for Colace for her constipation as well as information regarding diet recommendation. I discussed results, treatment plan, need for follow-up, and return precautions  with the patient. Provided opportunity for questions, patient confirmed understanding and is in agreement with plan.    Findings and plan of care discussed with supervising physician Dr. Pilar Plate who is in agreement.   Final Clinical Impressions(s) / ED Diagnoses   Final diagnoses:  Positive pregnancy test    ED Discharge Orders         Ordered    docusate sodium (COLACE) 100 MG capsule  2 times daily PRN     03/13/19 1337           Quenten Nawaz, [place] R, PA-C 03/13/19 1341    Sabas Sous, MD 03/20/19 [phone]

## 2019-03-13 NOTE — Telephone Encounter (Signed)
I called and asked for [place] to be scheduled in 7-10 days, first available is 03/26/19 at 0900.  I called Kensley and informed her of the appointment for the [place].  She voices understanding. Linda,RN

## 2019-03-14 LAB — HIV ANTIBODY (ROUTINE TESTING W REFLEX): HIV Screen 4th Generation wRfx: NONREACTIVE

## 2019-03-14 LAB — GC/CHLAMYDIA PROBE AMP (~~LOC~~) NOT AT ARMC
Chlamydia: NEGATIVE
Neisseria Gonorrhea: NEGATIVE

## 2019-03-15 ENCOUNTER — Inpatient Hospital Stay (HOSPITAL_COMMUNITY): Payer: Medicaid Other

## 2019-03-15 ENCOUNTER — Other Ambulatory Visit: Payer: Self-pay

## 2019-03-15 ENCOUNTER — Encounter (HOSPITAL_COMMUNITY): Payer: Self-pay | Admitting: *Deleted

## 2019-03-15 ENCOUNTER — Inpatient Hospital Stay (HOSPITAL_COMMUNITY)
Admission: AD | Admit: 2019-03-15 | Discharge: 2019-03-15 | Disposition: A | Discharge status: Home / Self Care | Payer: Medicaid Other | Attending: Obstetrics and Gynecology | Admitting: Obstetrics and Gynecology

## 2019-03-15 DIAGNOSIS — R102 Pelvic and perineal pain: Secondary | ICD-10-CM | POA: Insufficient documentation

## 2019-03-15 DIAGNOSIS — O26891 Other specified pregnancy related conditions, first trimester: Secondary | ICD-10-CM | POA: Diagnosis not present

## 2019-03-15 DIAGNOSIS — Z79899 Other long term (current) drug therapy: Secondary | ICD-10-CM | POA: Diagnosis not present

## 2019-03-15 DIAGNOSIS — Z3490 Encounter for supervision of normal pregnancy, unspecified, unspecified trimester: Secondary | ICD-10-CM

## 2019-03-15 DIAGNOSIS — R1031 Right lower quadrant pain: Secondary | ICD-10-CM | POA: Diagnosis not present

## 2019-03-15 DIAGNOSIS — R109 Unspecified abdominal pain: Secondary | ICD-10-CM

## 2019-03-15 DIAGNOSIS — Z3A01 Less than 8 weeks gestation of pregnancy: Secondary | ICD-10-CM | POA: Diagnosis not present

## 2019-03-15 DIAGNOSIS — O99011 Anemia complicating pregnancy, first trimester: Secondary | ICD-10-CM | POA: Insufficient documentation

## 2019-03-15 DIAGNOSIS — J45909 Unspecified asthma, uncomplicated: Secondary | ICD-10-CM | POA: Insufficient documentation

## 2019-03-15 DIAGNOSIS — Z87891 Personal history of nicotine dependence: Secondary | ICD-10-CM | POA: Diagnosis not present

## 2019-03-15 DIAGNOSIS — O9229 Other disorders of breast associated with pregnancy and the puerperium: Secondary | ICD-10-CM | POA: Insufficient documentation

## 2019-03-15 DIAGNOSIS — D573 Sickle-cell trait: Secondary | ICD-10-CM | POA: Insufficient documentation

## 2019-03-15 DIAGNOSIS — O99511 Diseases of the respiratory system complicating pregnancy, first trimester: Secondary | ICD-10-CM | POA: Diagnosis not present

## 2019-03-15 DIAGNOSIS — Z793 Long term (current) use of hormonal contraceptives: Secondary | ICD-10-CM | POA: Insufficient documentation

## 2019-03-15 LAB — URINALYSIS, ROUTINE W REFLEX MICROSCOPIC
Bilirubin Urine: NEGATIVE
Glucose, UA: NEGATIVE mg/dL
Hgb urine dipstick: NEGATIVE
Ketones, ur: NEGATIVE mg/dL
Leukocytes,Ua: NEGATIVE
Nitrite: NEGATIVE
Protein, ur: NEGATIVE mg/dL
Specific Gravity, Urine: 1.019 (ref 1.005–1.030)
pH: 6 (ref 5.0–8.0)

## 2019-03-15 LAB — RH IG WORKUP (INCLUDES ABO/RH)
ABO/RH(D): O NEG
Antibody Screen: NEGATIVE
Unit division: 0

## 2019-03-15 LAB — HCG, QUANTITATIVE, PREGNANCY: hCG, Beta Chain, Quant, S: 5880 m[IU]/mL — ABNORMAL HIGH (ref <5)

## 2019-03-15 NOTE — MAU Provider Note (Signed)
History   Patient Bailey Rose is a 23 y.o.  At 8020w1d here with complaints of suprapubic pain, pain over her right ovary  and shooting pain from stomach to left breast. She denies vaginal discharge, vaginal bleeding, dysuria, low back pain, some nausea. She had regular bowel movements today, yesterday and on Thursday.   [facility] pain on 03-13-2019; she was found to have a GS but no yolk sac. Beta was 4299 on Thursday, 8-27.  Since she was seen on Thursday the constipation has resolved, but she still has pelvic pain. She also reports that the sharp pains shooting up her left breast is new (not reported on Thursday in the ED) and she feels like it "takes her breath away" when she feels it. Pain over her right ovary is new.   CSN: [id number]  Arrival date and time: 03/15/19 1932   None     Chief Complaint  Patient presents with  . Pelvic Pain   Abdominal Pain This is a new problem. The current episode started yesterday. The problem occurs intermittently. The abdominal pain does not radiate. The pain is aggravated by being still. Relieved by: drinking water. Treatments tried: She "doesn't like to take medicine" so she didn't try anything.   She felt it 6 times on Friday and 3 times on Saturday. The last time she felt the pain was at 3-4 pm.  She also reports feeling pain in her right lower quadrant "over her ovary" and it goes along with the pain on her left side. The pain was also relieved by drinking Lime water.    OB History    Gravida  2   Para  0   Term  0   Preterm  0   AB  1   Living  0     SAB  1   TAB  0   Ectopic  0   Multiple  0   Live Births  0           Past Medical History:  Diagnosis Date  . Asthma   . Migraines   . Polycystic ovarian syndrome   . Sickle cell trait (HCC)   . Sickle cell trait [facility](HCC)     Past Surgical History:  Procedure Laterality Date  . TONSILLECTOMY    . WISDOM TOOTH EXTRACTION  2017    Family  History  Problem Relation Age of Onset  . Sickle cell anemia Mother   . Cancer Maternal Grandmother   . Migraines Maternal Grandmother     Social History   Tobacco Use  . Smoking status: Former Games developermoker  . Smokeless tobacco: Never Used  Substance Use Topics  . Alcohol use: No    Alcohol/week: 0.0 standard drinks  . Drug use: Not Currently    Types: Marijuana    Allergies: No Known Allergies  Medications Prior to Admission  Medication Sig Dispense Refill Last Dose  . docusate sodium (COLACE) 100 MG capsule Take 1 capsule (100 mg total) by mouth 2 (two) times daily as needed for mild constipation. 10 capsule 0 More than a month at Unknown time  . levonorgestrel (PLAN B ONE-STEP) 1.5 MG tablet Take 1.5 mg by mouth once.       Review of Systems  Constitutional: Negative.   HENT: Negative.   Gastrointestinal: Positive for abdominal pain.  Genitourinary: Negative.   Musculoskeletal: Negative.   Neurological: Negative.    Physical Exam   Blood pressure 121/62, pulse  92, temperature 98.8 F (37.1 C), resp. rate 18, height 5' (1.524 m), weight 76.7 kg, last menstrual period 02/07/2019, SpO2 98 %, unknown if currently breastfeeding.  Physical Exam  Constitutional: She appears well-developed.  HENT:  Head: Normocephalic.  Neck: Normal range of motion.  GI: Soft.  Genitourinary:    Genitourinary Comments: NEFG; slight tenderness on exam but no CMT, suprapubic or adnexal tenderness.    Musculoskeletal: Normal range of motion.  Neurological: She is alert.  Skin: Skin is warm.    MAU Course  Procedures  MDM -reviewed labs from [facility] ED visit; all negative (wet prep, GC chlamydia).  -reviewed beta and [place] results from [place]; gestational sac but no YS at that time.  -Today US shows YS; beta has risen from 4200 to 5880. Slow rise, but prescence of YS is reassuring for ectopic rule-out.   Assessment and Plan   1. Intrauterine pregnancy    2. Patient stable for discharge with  return precautions; return to MAU if condition changes or worsens.   3. Monitor diet, see if there is any connection between pain and food intake; exercise and stay healthy.    4. Make appt for follow up with [facility] (her preferred ob gyn provider)  5. Keep [place] appt on 03-26-2019.   Mervyn Skeeters Conita Amenta 03/15/2019, 10:53 PM

## 2019-03-15 NOTE — MAU Note (Signed)
Pt reports she is still having pelvic pain and cramping. Feels like her right ovary is inflamed. C/o Pain in her Left mid to upper quadrant that shoots up to her breast and giving her some shortness of breath.

## 2019-03-15 NOTE — Discharge Instructions (Signed)
-  call [facility] for NOB appt in 5 weeks.  -Keep appt for [place] on 03-26-2019 -watch diet, continue to drink water and see if reduce grease and sugar in foods helps relieve the abdominal pain.  -[facility] if pain worsens.   [place] for [employer] at [facility]       Phone: [phone]  [place] for [employer] at [place]   Phone: [place] for [employer] at [place]  Phone: [place] for [place] at [employer]  Phone: [place] for [place] at [place]  Phone: [place] for [place] at [facility]   Phone: [facility]       Phone: [phone]  [facility]    Phone: [phone]   [facility]    Phone: [phone]  [facility]    Phone: [place]    Phone: [phone]  [place] [facility]       Phone: [phone]   [place] [facility]-Maternity  Phone: [place]    Phone: [phone]  [facility]   Phone: [phone]  [facility]      Phone: [phone]  [facility]    Phone: [phone]

## 2019-03-25 ENCOUNTER — Inpatient Hospital Stay (HOSPITAL_COMMUNITY): Payer: Medicaid Other

## 2019-03-25 ENCOUNTER — Encounter (HOSPITAL_COMMUNITY): Payer: Self-pay

## 2019-03-25 ENCOUNTER — Other Ambulatory Visit: Payer: Self-pay

## 2019-03-25 ENCOUNTER — Telehealth: Payer: Self-pay | Admitting: Family Medicine

## 2019-03-25 ENCOUNTER — Inpatient Hospital Stay (HOSPITAL_COMMUNITY)
Admission: AD | Admit: 2019-03-25 | Discharge: 2019-03-25 | Disposition: A | Discharge status: Home / Self Care | Payer: Medicaid Other | Attending: Obstetrics and Gynecology | Admitting: Obstetrics and Gynecology

## 2019-03-25 DIAGNOSIS — O208 Other hemorrhage in early pregnancy: Secondary | ICD-10-CM | POA: Diagnosis not present

## 2019-03-25 DIAGNOSIS — J45909 Unspecified asthma, uncomplicated: Secondary | ICD-10-CM | POA: Diagnosis not present

## 2019-03-25 DIAGNOSIS — D573 Sickle-cell trait: Secondary | ICD-10-CM | POA: Insufficient documentation

## 2019-03-25 DIAGNOSIS — O418X10 Other specified disorders of amniotic fluid and membranes, first trimester, not applicable or unspecified: Secondary | ICD-10-CM

## 2019-03-25 DIAGNOSIS — O209 Hemorrhage in early pregnancy, unspecified: Secondary | ICD-10-CM

## 2019-03-25 DIAGNOSIS — O468X1 Other antepartum hemorrhage, first trimester: Secondary | ICD-10-CM | POA: Diagnosis not present

## 2019-03-25 DIAGNOSIS — O99011 Anemia complicating pregnancy, first trimester: Secondary | ICD-10-CM | POA: Diagnosis not present

## 2019-03-25 DIAGNOSIS — F17210 Nicotine dependence, cigarettes, uncomplicated: Secondary | ICD-10-CM | POA: Diagnosis not present

## 2019-03-25 DIAGNOSIS — O360110 Maternal care for anti-D [Rh] antibodies, first trimester, not applicable or unspecified: Secondary | ICD-10-CM

## 2019-03-25 DIAGNOSIS — Z3A09 9 weeks gestation of pregnancy: Secondary | ICD-10-CM | POA: Diagnosis not present

## 2019-03-25 DIAGNOSIS — O99511 Diseases of the respiratory system complicating pregnancy, first trimester: Secondary | ICD-10-CM | POA: Insufficient documentation

## 2019-03-25 DIAGNOSIS — O26891 Other specified pregnancy related conditions, first trimester: Secondary | ICD-10-CM | POA: Insufficient documentation

## 2019-03-25 DIAGNOSIS — Z3A01 Less than 8 weeks gestation of pregnancy: Secondary | ICD-10-CM | POA: Insufficient documentation

## 2019-03-25 DIAGNOSIS — O99331 Smoking (tobacco) complicating pregnancy, first trimester: Secondary | ICD-10-CM | POA: Insufficient documentation

## 2019-03-25 DIAGNOSIS — O200 Threatened abortion: Secondary | ICD-10-CM | POA: Diagnosis not present

## 2019-03-25 DIAGNOSIS — Z6741 Type O blood, Rh negative: Secondary | ICD-10-CM

## 2019-03-25 LAB — URINALYSIS, ROUTINE W REFLEX MICROSCOPIC
Bilirubin Urine: NEGATIVE
Glucose, UA: NEGATIVE mg/dL
Ketones, ur: NEGATIVE mg/dL
Leukocytes,Ua: NEGATIVE
Nitrite: NEGATIVE
Protein, ur: NEGATIVE mg/dL
Specific Gravity, Urine: 1.02 (ref 1.005–1.030)
pH: 5 (ref 5.0–8.0)

## 2019-03-25 MED ORDER — RHO D IMMUNE GLOBULIN 1500 UNIT/2ML IJ SOSY
300.0000 ug | PREFILLED_SYRINGE | Freq: Once | INTRAMUSCULAR | Status: AC
  Administered 2019-03-25: 12:00:00 300 ug via INTRAMUSCULAR
  Filled 2019-03-25: qty 2

## 2019-03-25 NOTE — Telephone Encounter (Signed)
Attempted to call patient about her appointment on 9/9 @ 9:40. No answer, left voicemail instructing to wear a face mask for the entire appointment and no visitors are allowed. Patient instructed not to attend her appointment if she has any symptoms. Symptom list and office number left. Patient instructed the appointment is after her ultrasound.

## 2019-03-25 NOTE — MAU Provider Note (Signed)
History     CSN: [id number]  Arrival date and time: 03/25/19 6834   First Provider Initiated Contact with Patient 03/25/19 [phone]      Chief Complaint  Patient presents with  . Abdominal Pain    bleeding cramping  . Vaginal Bleeding   HPI   [name] is a 23 y.o. female G2P0010 @ [redacted]w[redacted]d here in MAU with complaints of new onset vaginal bleeding. The bleeding started this morning at 0400. This is the first episode of bleeding she has had in this pregnancy. She had intercourse more than 24 hours ago. She did not notice bleeding right after intercourse. She attests to left ovarian pain that occurs when she walks or when she moves her left leg. No fever.   OB History    Gravida  2   Para  0   Term  0   Preterm  0   AB  1   Living  0     SAB  1   TAB  0   Ectopic  0   Multiple  0   Live Births  0           Past Medical History:  Diagnosis Date  . Asthma   . Migraines   . Polycystic ovarian syndrome   . Sickle cell trait ([place])   . Sickle cell trait [facility])     Past Surgical History:  Procedure Laterality Date  . TONSILLECTOMY    . WISDOM TOOTH EXTRACTION  2017    Family History  Problem Relation Age of Onset  . Sickle cell anemia Mother   . Cancer Maternal Grandmother   . Migraines Maternal Grandmother     Social History   Tobacco Use  . Smoking status: Light Tobacco Smoker    Types: Cigarettes  . Smokeless tobacco: Never Used  Substance Use Topics  . Alcohol use: No    Alcohol/week: 0.0 standard drinks  . Drug use: Not Currently    Types: Marijuana    Allergies: No Known Allergies  Medications Prior to Admission  Medication Sig Dispense Refill Last Dose  . docusate sodium (COLACE) 100 MG capsule Take 1 capsule (100 mg total) by mouth 2 (two) times daily as needed for mild constipation. 10 capsule 0    Results for orders placed or performed during the hospital encounter of 03/25/19 (from the past 48 hour(s))  Urinalysis, Routine w  reflex microscopic     Status: Abnormal   Collection Time: 03/25/19  9:25 AM  Result Value Ref Range   Color, Urine YELLOW YELLOW   APPearance CLOUDY (A) CLEAR   Specific Gravity, Urine 1.020 1.005 - 1.030   pH 5.0 5.0 - 8.0   Glucose, UA NEGATIVE NEGATIVE mg/dL   Hgb urine dipstick LARGE (A) NEGATIVE   Bilirubin Urine NEGATIVE NEGATIVE   Ketones, ur NEGATIVE NEGATIVE mg/dL   Protein, ur NEGATIVE NEGATIVE mg/dL   Nitrite NEGATIVE NEGATIVE   Leukocytes,Ua NEGATIVE NEGATIVE   RBC / HPF 0-5 0 - 5 RBC/hpf   WBC, UA 0-5 0 - 5 WBC/hpf   Bacteria, UA RARE (A) NONE SEEN   Squamous Epithelial / LPF 6-10 0 - 5   Mucus PRESENT     Comment: Performed at [facility], [address] [place], [place] [address]  Rh IG workup (includes ABO/Rh)     Status: None (Preliminary result)   Collection Time: 03/25/19 10:15 AM  Result Value Ref Range   Gestational Age(Wks) 6  ABO/RH(D) O NEG    Antibody Screen NEG    Unit Number [id number]    Blood Component Type RHIG    Unit division 00    Status of Unit ISSUED    Transfusion Status      OK TO TRANSFUSE Performed at [facility], [address] [place], [place] [address]    [place] Ob Transvaginal  Result Date: 03/25/2019 CLINICAL DATA:  Vaginal bleeding in first trimester of pregnancy EXAM: TRANSVAGINAL OB ULTRASOUND TECHNIQUE: Transvaginal ultrasound was performed for complete evaluation of the gestation as well as the maternal uterus, adnexal regions, and pelvic cul-de-sac. COMPARISON:  03/15/2019 FINDINGS: Intrauterine gestational sac: Present, single Yolk sac:  Present Embryo:  Not identified Cardiac Activity: N/A Heart Rate: N/A bpm MSD: 13.4 mm   6 w   1 d Subchorionic hemorrhage:  Small subchronic hemorrhage present Maternal uterus/adnexae: Uterus otherwise normal appearance. RIGHT ovary normal size and morphology, 3.7 x 2.6 x 2.0 cm. LEFT ovary normal size and morphology, 1.1 x 2.3 x 1.5 cm. No free pelvic fluid or adnexal masses.  IMPRESSION: Gestational sac is seen within uterus containing a yolk sac but no fetal pole. Absence of a fetal pole 9.5 days following visualization of a gestational sac with a yolk sac is suspicious for but not yet definitive for failed pregnancy. Recommend follow-up US in 10-14 days for definitive diagnosis. This recommendation follows SRU consensus guidelines: Diagnostic Criteria for Nonviable Pregnancy Early in the First Trimester. Malva Limes Engl J Med 2013; 478:2956-21; 369:1443-51. Electronically Signed   By: Ulyses SouthwardMark  Boles M.D.   On: 03/25/2019 12:32    Review of Systems  Gastrointestinal: Positive for abdominal pain.  Genitourinary: Positive for vaginal bleeding.   Physical Exam   Blood pressure 125/67, pulse 94, temperature 98.5 F (36.9 C), temperature source Oral, resp. rate 16, height 5' (1.524 m), weight 78.2 kg, last menstrual period 02/07/2019, SpO2 100 %, unknown if currently breastfeeding.  Physical Exam  Constitutional: She is oriented to person, place, and time. She appears well-developed and well-nourished. No distress.  HENT:  Head: Normocephalic.  GI: Soft. She exhibits no distension. There is no abdominal tenderness. There is no rebound.  Genitourinary:    Genitourinary Comments: Dilation: Closed, anterior. Small amount of dark blood noted on exam glove.  Exam by:: Venia CarbonJennifer  NP   Musculoskeletal: Normal range of motion.  Neurological: She is alert and oriented to person, place, and time.  Skin: Skin is warm. She is not diaphoretic.  Psychiatric: Her behavior is normal.    MAU Course  Procedures  MDM  Discussed US in detail with the patient and her partner who is at the bedside. There is growth in the gestational sac compared to previous study, however no fetal pole at this time. Message sent to cancel US tomorrow, will repeat US in 7-10 days.  Threatened miscarriage discussed. Rhogam given today.   Assessment and Plan   A:  1. Threatened miscarriage in early pregnancy   2.  Vaginal bleeding in pregnancy, first trimester   3. [redacted] weeks gestation of pregnancy   4. Type O blood, Rh negative   5. Subchorionic hemorrhage of placenta in first trimester, single or unspecified fetus     P:  Discharge home with strict return precautions Bleeding precautions Pelvic rest F/u US in 7-10 days. US will call to schedule. Return to MAU if symptoms worsen  , Harolyn RutherfordJennifer I, NP 03/26/2019 8:36 AM

## 2019-03-25 NOTE — Discharge Instructions (Signed)
Subchorionic Hematoma ° °A subchorionic hematoma is a gathering of blood between the outer wall of the embryo (chorion) and the inner wall of the womb (uterus). °This condition can cause vaginal bleeding. If they cause little or no vaginal bleeding, early small hematomas usually shrink on their own and do not affect your baby or pregnancy. When bleeding starts later in pregnancy, or if the hematoma is larger or occurs in older pregnant women, the condition may be more serious. Larger hematomas may get bigger, which increases the chances of miscarriage. This condition also increases the risk of: °· Premature separation of the placenta from the uterus. °· Premature (preterm) labor. °· Stillbirth. °What are the causes? °The exact cause of this condition is not known. It occurs when blood is trapped between the placenta and the uterine wall because the placenta has separated from the original site of implantation. °What increases the risk? °You are more likely to develop this condition if: °· You were treated with fertility medicines. °· You conceived through in vitro fertilization (IVF). °What are the signs or symptoms? °Symptoms of this condition include: °· Vaginal spotting or bleeding. °· Contractions of the uterus. These cause abdominal pain. °Sometimes you may have no symptoms and the bleeding may only be seen when ultrasound images are taken (transvaginal ultrasound). °How is this diagnosed? °This condition is diagnosed based on a physical exam. This includes a pelvic exam. You may also have other tests, including: °· Blood tests. °· Urine tests. °· Ultrasound of the abdomen. °How is this treated? °Treatment for this condition can vary. Treatment may include: °· Watchful waiting. You will be monitored closely for any changes in bleeding. During this stage: °? The hematoma may be reabsorbed by the body. °? The hematoma may separate the fluid-filled space containing the embryo (gestational sac) from the wall of the  womb (endometrium). °· Medicines. °· Activity restriction. This may be needed until the bleeding stops. °Follow these instructions at home: °· Stay on bed rest if told to do so by your health care provider. °· Do not lift anything that is heavier than 10 lbs. (4.5 kg) or as told by your health care provider. °· Do not use any products that contain nicotine or tobacco, such as cigarettes and e-cigarettes. If you need help quitting, ask your health care provider. °· Track and write down the number of pads you use each day and how soaked (saturated) they are. °· Do not use tampons. °· Keep all follow-up visits as told by your health care provider. This is important. Your health care provider may ask you to have follow-up blood tests or ultrasound tests or both. °Contact a health care provider if: °· You have any vaginal bleeding. °· You have a fever. °Get help right away if: °· You have severe cramps in your stomach, back, abdomen, or pelvis. °· You pass large clots or tissue. Save any tissue for your health care provider to look at. °· You have more vaginal bleeding, and you faint or become lightheaded or weak. °Summary °· A subchorionic hematoma is a gathering of blood between the outer wall of the placenta and the uterus. °· This condition can cause vaginal bleeding. °· Sometimes you may have no symptoms and the bleeding may only be seen when ultrasound images are taken. °· Treatment may include watchful waiting, medicines, or activity restriction. °This information is not intended to replace advice given to you by your health care provider. Make sure you discuss any questions you   have with your health care provider. °Document Released: 10/18/2006 Document Revised: 06/15/2017 Document Reviewed: 08/29/2016 °Elsevier Patient Education © 2020 [employer] Inc[employer] ° °

## 2019-03-25 NOTE — MAU Note (Signed)
Pt presents to MAU for cramping and bleeding, she thinks this could be a miscarriage like last time, symptoms started last night about 3-4 am as stated by pt, bleeding is light.

## 2019-03-26 ENCOUNTER — Ambulatory Visit: Payer: Medicaid Other

## 2019-03-26 ENCOUNTER — Encounter: Payer: Self-pay | Admitting: Family Medicine

## 2019-03-26 ENCOUNTER — Ambulatory Visit (HOSPITAL_COMMUNITY): Admission: RE | Admit: 2019-03-26 | Payer: Self-pay | Source: Ambulatory Visit

## 2019-03-26 LAB — RH IG WORKUP (INCLUDES ABO/RH)
ABO/RH(D): O NEG
Antibody Screen: NEGATIVE
Gestational Age(Wks): 6
Unit division: 0

## 2019-04-02 ENCOUNTER — Telehealth: Payer: Self-pay | Admitting: General Practice

## 2019-04-02 NOTE — Telephone Encounter (Signed)
Patient called and left message on nurse voicemail line stating she saw Bailey Rose recently and is around [redacted] weeks pregnant. She states she is still having spotting and isn't sure what to do. She also thinks she has a bacterial infection. Called patient, no answer- left message stating we are trying to reach you to return your phone call, please call us back if you still need assistance.

## 2019-04-03 ENCOUNTER — Inpatient Hospital Stay (HOSPITAL_COMMUNITY)
Admission: AD | Admit: 2019-04-03 | Discharge: 2019-04-03 | Disposition: A | Discharge status: Home / Self Care | Payer: Medicaid Other | Attending: Obstetrics and Gynecology | Admitting: Obstetrics and Gynecology

## 2019-04-03 ENCOUNTER — Inpatient Hospital Stay (HOSPITAL_COMMUNITY): Payer: Medicaid Other

## 2019-04-03 ENCOUNTER — Other Ambulatory Visit: Payer: Self-pay

## 2019-04-03 ENCOUNTER — Encounter (HOSPITAL_COMMUNITY): Payer: Self-pay | Admitting: *Deleted

## 2019-04-03 ENCOUNTER — Other Ambulatory Visit: Payer: Self-pay | Admitting: Student

## 2019-04-03 DIAGNOSIS — J45909 Unspecified asthma, uncomplicated: Secondary | ICD-10-CM | POA: Diagnosis not present

## 2019-04-03 DIAGNOSIS — O039 Complete or unspecified spontaneous abortion without complication: Secondary | ICD-10-CM | POA: Diagnosis not present

## 2019-04-03 DIAGNOSIS — F17210 Nicotine dependence, cigarettes, uncomplicated: Secondary | ICD-10-CM | POA: Diagnosis not present

## 2019-04-03 DIAGNOSIS — O209 Hemorrhage in early pregnancy, unspecified: Secondary | ICD-10-CM | POA: Diagnosis not present

## 2019-04-03 DIAGNOSIS — Z3A01 Less than 8 weeks gestation of pregnancy: Secondary | ICD-10-CM | POA: Insufficient documentation

## 2019-04-03 DIAGNOSIS — Z6791 Unspecified blood type, Rh negative: Secondary | ICD-10-CM | POA: Insufficient documentation

## 2019-04-03 DIAGNOSIS — O021 Missed abortion: Secondary | ICD-10-CM | POA: Diagnosis not present

## 2019-04-03 LAB — URINALYSIS, ROUTINE W REFLEX MICROSCOPIC
Bilirubin Urine: NEGATIVE
Glucose, UA: NEGATIVE mg/dL
Ketones, ur: NEGATIVE mg/dL
Nitrite: NEGATIVE
Protein, ur: NEGATIVE mg/dL
Specific Gravity, Urine: 1.004 — ABNORMAL LOW (ref 1.005–1.030)
pH: 8 (ref 5.0–8.0)

## 2019-04-03 LAB — WET PREP, GENITAL
Clue Cells Wet Prep HPF POC: NONE SEEN
Sperm: NONE SEEN
Trich, Wet Prep: NONE SEEN
Yeast Wet Prep HPF POC: NONE SEEN

## 2019-04-03 LAB — CBC
HCT: 35.5 % — ABNORMAL LOW (ref 36.0–46.0)
Hemoglobin: 12 g/dL (ref 12.0–15.0)
MCH: 24 pg — ABNORMAL LOW (ref 26.0–34.0)
MCHC: 33.8 g/dL (ref 30.0–36.0)
MCV: 70.9 fL — ABNORMAL LOW (ref 80.0–100.0)
Platelets: 209 10*3/uL (ref 150–400)
RBC: 5.01 MIL/uL (ref 3.87–5.11)
RDW: 15.2 % (ref 11.5–15.5)
WBC: 8.4 10*3/uL (ref 4.0–10.5)
nRBC: 0 % (ref 0.0–0.2)

## 2019-04-03 LAB — HCG, QUANTITATIVE, PREGNANCY: hCG, Beta Chain, Quant, S: 24741 m[IU]/mL — ABNORMAL HIGH (ref <5)

## 2019-04-03 MED ORDER — ALBUTEROL SULFATE HFA 108 (90 BASE) MCG/ACT IN AERS: 1.0000 | INHALATION_SPRAY | Freq: Four times a day (QID) | RESPIRATORY_TRACT | 1 refills | Status: DC | PRN

## 2019-04-03 MED ORDER — IBUPROFEN 800 MG PO TABS: 800.0000 mg | ORAL_TABLET | Freq: Three times a day (TID) | ORAL | 0 refills | Status: DC | PRN

## 2019-04-03 MED ORDER — MISOPROSTOL 200 MCG PO TABS: ORAL_TABLET | ORAL | 1 refills | Status: DC

## 2019-04-03 MED ORDER — ONDANSETRON HCL 8 MG PO TABS: 8.0000 mg | ORAL_TABLET | Freq: Three times a day (TID) | ORAL | 0 refills | Status: DC | PRN

## 2019-04-03 MED ORDER — ACETAMINOPHEN-CODEINE #3 300-30 MG PO TABS: 2.0000 | ORAL_TABLET | Freq: Four times a day (QID) | ORAL | 0 refills | Status: DC | PRN

## 2019-04-03 MED ORDER — BUDESONIDE 180 MCG/ACT IN AEPB: 2.0000 | INHALATION_SPRAY | Freq: Two times a day (BID) | RESPIRATORY_TRACT | 1 refills | Status: DC

## 2019-04-03 NOTE — MAU Note (Signed)
Having cramping and bleeding.  Not a heavy flow, is 3rd day she has been spotting. Cramping started this morning.  Also has a weird odor

## 2019-04-03 NOTE — Discharge Instructions (Signed)
-take the pills tonight; one between each cheek and two in the vagina. You should start having bleeding and cramping in the next two-three days. If no signs and symptoms, you can repeat the regime on Sunday.  -Return to MAU if you are going through more than 2 pads (soaked) an hour for two hours in a row.  -Return to MAU if you start having fever, abdominal tenderness, foul-smelling discharge (worse than what it is now) -We will call you about follow up blood draw in one week and visit with Doctor in two weeks.    Managing Pregnancy Loss Pregnancy loss can happen any time during a pregnancy. Often the cause is not known. It is rarely because of anything you did. Pregnancy loss in early pregnancy (during the first trimester) is called a miscarriage. This type of pregnancy loss is the most common. Pregnancy loss that happens after 20 weeks of pregnancy is called fetal demise if the baby's heart stops beating before birth. Fetal demise is much less common. Some women experience spontaneous labor shortly after fetal demise resulting in a stillborn birth (stillbirth). Any pregnancy loss can be devastating. You will need to recover both physically and emotionally. Most women are able to get pregnant again after a pregnancy loss and deliver a healthy baby. How to manage emotional recovery  Pregnancy loss is very hard emotionally. You may feel many different emotions while you grieve. You may feel sad and angry. You may also feel guilty. It is normal to have periods of crying. Emotional recovery can take longer than physical recovery. It is different for everyone. Taking these steps can help you in managing this loss:  Remember that it is unlikely you did anything to cause the pregnancy loss.  Share your thoughts and feelings with friends, family, and your partner. Remember that your partner is also recovering emotionally.  Make sure you have a good support system. Do not spend too much time alone.  Meet  with a pregnancy loss counselor or join a pregnancy loss support group.  Get enough sleep and eat a healthy diet. Return to regular exercise when you have recovered physically.  Do not use drugs or alcohol to manage your emotions.  Consider seeing a mental health professional to help you recover emotionally.  Ask a friend or loved one to help you decide what to do with any clothing and nursery items you received for your baby. In the case of a stillbirth, many women benefit from taking additional steps in the grieving process. You may want to:  Hold your baby after the birth.  Name your baby.  Request a birth certificate.  Create a keepsake such as handprints or footprints.  Dress your baby and have a picture taken.  Make funeral arrangements.  Ask for a baptism or blessing. Hospitals have staff members who can help you with all these arrangements. How to recognize emotional stress It is normal to have emotional stress after a pregnancy loss. But emotional stress that lasts a long time or becomes severe requires treatment. Watch out for these signs of severe emotional stress:  Sadness, anger, or guilt that is not going away and is interfering with your normal activities.  Relationship problems that have occurred or gotten worse since the pregnancy loss.  Signs of depression that last longer than 2 weeks. These may include: ? Sadness. ? Anxiety. ? Hopelessness. ? Loss of interest in activities you enjoy. ? Inability to concentrate. ? Trouble sleeping or sleeping too much. ?  Loss of appetite or overeating. ? Thoughts of death or of hurting yourself. Follow these instructions at home:  Take over-the-counter and prescription medicines only as told by your health care provider.  Rest at home until your energy level returns. Return to your normal activities as told by your health care provider. Ask your health care provider what activities are safe for you.  When you are  ready, meet with your health care provider to discuss steps to take for a future pregnancy.  Keep all follow-up visits as told by your health care provider. This is important. Where to find support  To help you and your partner with the process of grieving, talk with your health care provider or seek counseling.  Consider meeting with others who have experienced pregnancy loss. Ask your health care provider about support groups and resources. Where to find more information  [place] Department of Health and Cytogeneticist on Women's Health: http://hoffman.com/  American Pregnancy Association: www.americanpregnancy.org Contact a health care provider if:  You continue to experience grief, sadness, or lack of motivation for everyday activities, and those feelings do not improve over time.  You are struggling to recover emotionally, especially if you are using alcohol or substances to help. Get help right away if:  You have thoughts of hurting yourself or others. If you ever feel like you may hurt yourself or others, or have thoughts about taking your own life, get help right away. You can go to your nearest emergency department or call:  Your local emergency services (911 in the [place]  A suicide crisis helpline, such as the National Suicide Prevention Lifeline at (787)163-4367. This is open 24 hours a day. Summary  Any pregnancy loss can be difficult physically and emotionally.  You may experience many different emotions while you grieve. Emotional recovery can last longer than physical recovery.  It is normal to have emotional stress after a pregnancy loss. But emotional stress that lasts a long time or becomes severe requires treatment.  See your health care provider if you are struggling emotionally after a pregnancy loss. This information is not intended to replace advice given to you by your health care provider. Make sure you discuss any questions you have with your health  care provider. Document Released: 09/13/2017 Document Revised: 10/23/2018 Document Reviewed: 09/13/2017 Elsevier Patient Education  2020 [employer].     Miscarriage A miscarriage is the loss of an unborn baby (fetus) before the 20th week of pregnancy. Follow these instructions at home: Medicines   Take over-the-counter and prescription medicines only as told by your doctor.  If you were prescribed antibiotic medicine, take it as told by your doctor. Do not stop taking the antibiotic even if you start to feel better.  Do not take NSAIDs unless your doctor says that this is safe for you. NSAIDs include aspirin and ibuprofen. These medicines can cause bleeding. Activity  Rest as directed. Ask your doctor what activities are safe for you.  Have someone help you at home during this time. General instructions  Write down how many pads you use each day and how soaked they are.  Watch the amount of tissue or clumps of blood (blood clots) that you pass from your vagina. Save any large amounts of tissue for your doctor.  Do not use tampons, douche, or have sex until your doctor approves.  To help you and your partner with the process of grieving, talk with your doctor or seek counseling.  When you  are ready, meet with your doctor to talk about steps you should take for your health. Also, talk with your doctor about steps to take to have a healthy pregnancy in the future.  Keep all follow-up visits as told by your doctor. This is important. Contact a doctor if:  You have a fever or chills.  You have vaginal discharge that smells bad.  You have more bleeding. Get help right away if:  You have very bad cramps or pain in your back or belly.  You pass clumps of blood that are walnut-sized or larger from your vagina.  You pass tissue that is walnut-sized or larger from your vagina.  You soak more than 1 regular pad in an hour.  You get light-headed or weak.  You faint (pass  out).  You have feelings of sadness that do not go away, or you have thoughts of hurting yourself. Summary  A miscarriage is the loss of an unborn baby before the 20th week of pregnancy.  Follow your doctor's instructions for home care. Keep all follow-up appointments.  To help you and your partner with the process of grieving, talk with your doctor or seek counseling. This information is not intended to replace advice given to you by your health care provider. Make sure you discuss any questions you have with your health care provider. Document Released: 09/25/2011 Document Revised: 10/25/2018 Document Reviewed: 08/08/2016 Elsevier Patient Education  2020 [employer]

## 2019-04-03 NOTE — MAU Provider Note (Addendum)
History     CSN: [id number]  Arrival date and time: 04/03/19 1332   None     Chief Complaint  Patient presents with  . Abdominal Pain  . Vaginal Bleeding   HPI  Bailey Rose is a G2P0010 at [redacted]w[redacted]d based on LMP with a history of PCOS, Asthma, Migraines, and sickle cell trait presenting to the MAU for vaginal spotting x 3 days and a malodorous yellow/white discharge x 4 days with a new onset of abdominal cramping starting today. She was seen in the MAU two times previously in the last month for suprapubic pain and right ovary pain on 03/15/19 and vaginal bleeding on 03/25/19. Ectopic pregnancy rule-out workup completed at previous MAU visit. Transvaginal US on 03/25/19 shows a small subchorionic hemorrhage and IUP was confirmed but concern for threatened miscarriage. She is Rh negative and given Rhogam on 03/25/19.   She reports abdominal cramping that started today. She has been having a malodorous discharge over the last four days and is concerned for STI but denies dysuria. She reports taking a apple cider vinegar bath to help with odor. She reports nausea that has been unchanged since pregnancy but denies vomiting. Reports SOB with exertion with a known history of asthma without current treatment. She admits having headaches that are stable and unchanged. She denies any visual changes, diarrhea or constipation, fevers or chills.   OB History    Gravida  2   Para  0   Term  0   Preterm  0   AB  1   Living  0     SAB  1   TAB  0   Ectopic  0   Multiple  0   Live Births  0           Past Medical History:  Diagnosis Date  . Asthma   . Migraines   . Polycystic ovarian syndrome   . Sickle cell trait ([place])   . Sickle cell trait [facility])     Past Surgical History:  Procedure Laterality Date  . TONSILLECTOMY    . WISDOM TOOTH EXTRACTION  2017    Family History  Problem Relation Age of Onset  . Sickle cell anemia Mother   . Cancer Maternal Grandmother   . Migraines  Maternal Grandmother     Social History   Tobacco Use  . Smoking status: Light Tobacco Smoker    Types: Cigarettes  . Smokeless tobacco: Never Used  Substance Use Topics  . Alcohol use: No    Alcohol/week: 0.0 standard drinks  . Drug use: Not Currently    Types: Marijuana    Allergies: No Known Allergies  No medications prior to admission.    Review of Systems  Constitutional: Negative for chills and fever.  Respiratory: Positive for shortness of breath (history of asthma).   Cardiovascular: Negative for chest pain.  Gastrointestinal: Positive for abdominal pain (cramping) and nausea (stable). Negative for constipation, diarrhea and vomiting.  Genitourinary: Positive for vaginal bleeding. Negative for dysuria.  Neurological: Negative for dizziness and light-headedness.   Physical Exam   Blood pressure 108/72, pulse 89, temperature 98.6 F (37 C), temperature source Oral, resp. rate 18, height 5' (1.524 m), weight 79.4 kg, last menstrual period 02/07/2019, SpO2 100 %, unknown if currently breastfeeding.  Physical Exam  Constitutional: She is oriented to person, place, and time. She appears well-developed and well-nourished. No distress.  HENT:  Head: Normocephalic and atraumatic.  Cardiovascular: Normal rate, regular rhythm and normal  heart sounds.  Respiratory: Effort normal and breath sounds normal.  GI: Soft. Bowel sounds are normal. She exhibits no distension. There is abdominal tenderness (suprapubic tenderness with deep palpation). There is no guarding.  Genitourinary: There is no rash, tenderness, lesion or injury on the right labia. There is no rash, tenderness, lesion or injury on the left labia. Cervix exhibits no motion tenderness.    Genitourinary Comments: Small amount of blood noted pooling in posterior fornix of vagina mixed with thin yellow discharge.    Neurological: She is alert and oriented to person, place, and time.  Skin: Skin is warm and dry. She is  not diaphoretic.   RESULTS:  [place] Ob Transvaginal  Result Date: 04/03/2019 CLINICAL DATA:  Vaginal bleeding EXAM: OBSTETRIC <14 WK ULTRASOUND TECHNIQUE: Transabdominal ultrasound was performed for evaluation of the gestation as well as the maternal uterus and adnexal regions. COMPARISON:  March 15, 2019 and March 25, 2019 FINDINGS: Intrauterine gestational sac: Single Yolk sac:  Visualized. Embryo:  Not Visualized. MSD:  12.64 mm   6 w   0 d Subchorionic hemorrhage: A small subchorionic hemorrhage is identified. Maternal uterus/adnexae: The ovaries are normal in appearance. IMPRESSION: 1. The lack of a fetal pole 19 days after visualization of a gestational sac containing a yolk sac is consistent with a definitely nonviable pregnancy. Findings meet definitive criteria for failed pregnancy. This follows SRU consensus guidelines: Diagnostic Criteria for Nonviable Pregnancy Early in the First Trimester. Macy Mis Engl J Med [phone]. 2. Small subchorionic hemorrhage. Electronically Signed   By: Gerome Samavid  Williams III M.D   On: 04/03/2019 18:36   MAU Course  Procedures   MDM  New onset of abdominal cramping in the presence of vaginal spotting concerning for threatened miscarriage in first trimester.Obtained a b-hCG and repeated transvaginal US to assess growth and viability. CBC and vitals confirming patient is hemodynamically stable. Transvaginal US showing intrauterine gestation sac and yolk sac with no visualization of embryo and presence of small subchorionic hemorrhage. Findings meet definitive criteria for failed pregnancy.    New onset of malodorous discharge and concern for STI obtained UA, wet prep, and GC/Chlamydia testing to assess.  Wet prep negative for Trich or BV.  GC/Chlamydia pending, patient will be called if results are positive.     Assessment and Plan  1. Early pregnancy loss -Therapeutic options discussed with patient. Patient wishes to use medical management.  -Cytotec   -Tylenol #3  - Zofran  -Schedule for outpatient follow up in clinic for evaluation and serial b-hCG -GC/Chlamydia pending, patient will be called if results are positive  2. Asthma - Albuterol - Budesonide    Charlesetta GaribaldiKathryn Lorraine Tilly Pernice PA-S 04/04/2019, 12:44 PM    I confirm that I have verified the information documented in the physician assistant student's note and that I have also personally reperformed the history, physical exam and all medical decision making activities of this service and have verified that all service and findings are accurately documented in this student's note.    -Patient here for vaginal spotting and abdominal pain,  -trace amount of dark brown blood in the vagina, no CMT --Patient reports SOB, but is well-appearing with stable vital signs. No wheezing on PE. She says thinks she would use her rescue inhaler "all the time" if she had one -beta today is 27,000 but US shows no growth in gestational sac and radiology report is definitive for failed pregnancy.  -Patient and partner discussed miscarriage management plan; she elects cytotec.  -Detailed  instructions given, as well as rescue and daily low dose ICS alhalers -GC CT cultures pending -Patient did not request pain medicine while in MAU.  -Will send message to clinic to schedule non-stat BHCG next week as well as provider visit in two weeks.     Early Intrauterine Pregnancy Failure Protocol X  Documented intrauterine pregnancy failure less than or equal to [redacted] weeks   gestation  X  No serious current illness  X  Baseline Hgb greater than or equal to 10g/dl  X  Patient has easily accessible transportation to the hospital  X  Clear preference  X  Practitioner/physician deems patient reliable  X  Counseling by practitioner or physician  X  Patient education by RN  X  Consent form signed  x     Rho-Gam given by RN if indicated -Given on 9-8 X  Medication dispensed  X  Cytotec 800 mcg Intravaginally by  patient at home       Intravaginally by NP in MAU       Rectally by patient at home       Rectally by RN in MAU  X   Ibuprofen 600 mg 1 tablet by mouth every 6 hours as needed #30 - prescribed  X   Tylenol #3 mg by mouth every 4 to 6 hours as needed - prescribed  X   Zofran 8 mg by mouth every 8 hours as needed for nausea - prescribed  Reviewed with pt cytotec procedure.  Pt verbalizes that she lives close to the hospital and has transportation readily available.  Pt appears reliable and verbalizes understanding and agrees with plan of care  Marylene Land, CNM 04/04/2019 12:44 PM

## 2019-04-05 LAB — CERVICOVAGINAL ANCILLARY ONLY
Chlamydia: NEGATIVE
Neisseria Gonorrhea: NEGATIVE

## 2019-04-08 ENCOUNTER — Ambulatory Visit (HOSPITAL_COMMUNITY): Payer: Medicaid Other

## 2019-04-08 ENCOUNTER — Ambulatory Visit: Payer: Medicaid Other

## 2019-04-09 ENCOUNTER — Other Ambulatory Visit: Payer: Self-pay | Admitting: *Deleted

## 2019-04-09 DIAGNOSIS — O039 Complete or unspecified spontaneous abortion without complication: Secondary | ICD-10-CM

## 2019-04-10 ENCOUNTER — Telehealth: Payer: Self-pay | Admitting: Family Medicine

## 2019-04-10 NOTE — Telephone Encounter (Signed)
Attempted to call patient about her appointment on 9/25 @ 10:30. No answer left voicemail instructing patient to wear a face mask for the entire appointment and no visitors are allowed during the visit. Patient instructed not to attend the appointment if she was any symptoms. Symptom list and office number left.

## 2019-04-11 ENCOUNTER — Telehealth: Payer: Self-pay | Admitting: General Practice

## 2019-04-11 ENCOUNTER — Other Ambulatory Visit: Payer: Medicaid Other

## 2019-04-11 NOTE — Telephone Encounter (Signed)
Patient called and left message on nurse voicemail line stating she doesn't know why she is getting calls from [place] & would like a call back. Per chart review, it appears calls were made regarding upcoming appts.  Called patient, no answer- left message stating we are trying to reach you to return your phone call. We will see you for your appt on Monday 9/28 @ 4pm. Please call us back if you have questions.

## 2019-04-14 ENCOUNTER — Other Ambulatory Visit: Payer: Self-pay

## 2019-04-14 ENCOUNTER — Other Ambulatory Visit: Payer: Medicaid Other

## 2019-04-14 ENCOUNTER — Ambulatory Visit: Payer: Medicaid Other | Admitting: Nurse Practitioner

## 2019-04-14 DIAGNOSIS — O039 Complete or unspecified spontaneous abortion without complication: Secondary | ICD-10-CM | POA: Diagnosis not present

## 2019-04-15 LAB — BETA HCG QUANT (REF LAB): hCG Quant: 191 m[IU]/mL

## 2019-04-25 ENCOUNTER — Ambulatory Visit (INDEPENDENT_AMBULATORY_CARE_PROVIDER_SITE_OTHER): Payer: Medicaid Other | Admitting: Obstetrics and Gynecology

## 2019-04-25 ENCOUNTER — Encounter: Payer: Self-pay | Admitting: Obstetrics and Gynecology

## 2019-04-25 ENCOUNTER — Other Ambulatory Visit (HOSPITAL_COMMUNITY)
Admission: RE | Admit: 2019-04-25 | Discharge: 2019-04-25 | Disposition: A | Discharge status: Home / Self Care | Payer: Medicaid Other | Source: Ambulatory Visit | Attending: Obstetrics and Gynecology | Admitting: Obstetrics and Gynecology

## 2019-04-25 ENCOUNTER — Other Ambulatory Visit: Payer: Self-pay

## 2019-04-25 VITALS — BP 116/71 | HR 98 | Temp 98.3°F | Ht 61.0 in | Wt 175.6 lb

## 2019-04-25 DIAGNOSIS — N898 Other specified noninflammatory disorders of vagina: Secondary | ICD-10-CM | POA: Insufficient documentation

## 2019-04-25 DIAGNOSIS — Z5189 Encounter for other specified aftercare: Secondary | ICD-10-CM

## 2019-04-25 DIAGNOSIS — N760 Acute vaginitis: Secondary | ICD-10-CM | POA: Diagnosis not present

## 2019-04-25 DIAGNOSIS — B9689 Other specified bacterial agents as the cause of diseases classified elsewhere: Secondary | ICD-10-CM

## 2019-04-25 DIAGNOSIS — O039 Complete or unspecified spontaneous abortion without complication: Secondary | ICD-10-CM

## 2019-04-25 MED ORDER — FLUCONAZOLE 150 MG PO TABS: 150.0000 mg | ORAL_TABLET | Freq: Once | ORAL | 3 refills | Status: AC

## 2019-04-25 MED ORDER — METRONIDAZOLE 500 MG PO TABS: 500.0000 mg | ORAL_TABLET | Freq: Two times a day (BID) | ORAL | 0 refills | Status: DC

## 2019-04-25 NOTE — Progress Notes (Signed)
Obstetrics and Gynecology Visit Return Patient Evaluation  Appointment Date: 04/25/2019  Primary Care Provider: [place], [place] for [facility]  Chief Complaint: follow up medical management of missed AB  History of Present Illness:  Bailey Rose is a 23 y.o. [phone] with above CC. Patient went to MAU on 9/17 and dx with IUP, missed AB at 6/0 on rpt u/s. Pt offered cytotec and took medications the next day. She had cramping for about a week and had bleeding but not too heavy.  Having brown discharge and smell, some itching. She has not had a period in the interim. On 9/17, her beta was 24k and on 9/28 (she was set up for rpt beta) it was 191  She denies any pain, fevers, chills.   Review of Systems: as noted in the History of Present Illness.   Patient Active Problem List   Diagnosis Date Noted  . Vaginal discharge 04/25/2019  . Vaginal odor 04/25/2019  . Follow-up visit after miscarriage 04/25/2019  . Encounter for Nexplanon removal 07/24/2017  . Breakthrough bleeding on Nexplanon 08/07/2015  . PCOS (polycystic ovarian syndrome) 03/18/2015  . Acne cystica 02/10/2015  . Dysmenorrhea 06/24/2013   Medications:  none   Allergies: has No Known Allergies.  Physical Exam:  BP 116/71   Pulse 98   Temp 98.3 F (36.8 C)   Ht 5\' 1"  (1.549 m)   Wt 175 lb 9.6 oz (79.7 kg)   LMP 02/07/2019 Comment: SAB  Breastfeeding Unknown   BMI 33.18 kg/m  Body mass index is 33.18 kg/m. General appearance: Well nourished, well developed female in no acute distress.  Neuro/Psych:  Normal mood and affect.    Bedside u/s (transabdominal): ?retained products in the uterus  Assessment: pt stable  Plan:  1. Vaginal odor Could be related POCs. Pt amenable to flagyl and diflucan - Cervicovaginal ancillary only( [place])  2. Vaginal discharge  3. Follow-up visit after miscarriage No vag probe in the office. Will set her up for rpt u/s. D/w her  unlikely given high success rate with medical management but needed to make sure. Patient has used depo provera (weight gain) and nexplanon (aub x 2 years) in the past. She doesn't desire IUD. Can d/w her more at next visit - US PELVIS TRANSVAGINAL NON-OB (TV ONLY); Future  4. BV (bacterial vaginosis)  5. Vaginal itching   RTC: virtual visit after u/s  Durene Romans MD Attending Center for [facility])

## 2019-04-25 NOTE — Progress Notes (Signed)
Pt states having spotting with odor, she states spotting is brown blood.

## 2019-04-30 ENCOUNTER — Ambulatory Visit (HOSPITAL_COMMUNITY): Admission: RE | Admit: 2019-04-30 | Payer: Medicaid Other | Source: Ambulatory Visit

## 2019-05-05 LAB — CERVICOVAGINAL ANCILLARY ONLY
Bacterial Vaginitis (gardnerella): POSITIVE — AB
Candida Glabrata: NEGATIVE
Candida Vaginitis: NEGATIVE
Chlamydia: NEGATIVE
Comment: NEGATIVE
Comment: NEGATIVE
Comment: NEGATIVE
Comment: NEGATIVE
Comment: NEGATIVE
Comment: NORMAL
Neisseria Gonorrhea: NEGATIVE
Trichomonas: NEGATIVE

## 2019-05-13 ENCOUNTER — Ambulatory Visit (HOSPITAL_COMMUNITY): Admission: RE | Admit: 2019-05-13 | Payer: Medicaid Other | Source: Ambulatory Visit

## 2019-06-05 ENCOUNTER — Telehealth: Payer: Self-pay | Admitting: Obstetrics and Gynecology

## 2019-06-05 NOTE — Telephone Encounter (Signed)
Attempted to call patient about her appointment on 11/20 @ 8:15. No answer left voicemail instructing patient to wear a face mask for the entire appointment and no visitors are allowed during the visit. Patient instructed not to attend the appointment if she was any symptoms. Symptom list and office number left.

## 2019-06-06 ENCOUNTER — Ambulatory Visit: Payer: Medicaid Other | Admitting: Obstetrics and Gynecology

## 2019-06-27 ENCOUNTER — Other Ambulatory Visit: Payer: Self-pay

## 2019-06-27 DIAGNOSIS — Z20822 Contact with and (suspected) exposure to covid-19: Secondary | ICD-10-CM

## 2019-06-27 DIAGNOSIS — Z20828 Contact with and (suspected) exposure to other viral communicable diseases: Secondary | ICD-10-CM | POA: Diagnosis not present

## 2019-06-28 LAB — NOVEL CORONAVIRUS, NAA: SARS-CoV-2, NAA: NOT DETECTED

## 2019-07-04 ENCOUNTER — Other Ambulatory Visit: Payer: Self-pay

## 2019-07-07 ENCOUNTER — Telehealth: Payer: Self-pay | Admitting: Family Medicine

## 2019-07-07 ENCOUNTER — Other Ambulatory Visit: Payer: Medicaid Other

## 2019-07-07 NOTE — Telephone Encounter (Signed)
Attempted to contact patient to get her appointment on 12/23 rescheduled due to a change in the office. No answer, left voicemail with new appointment information. Patient instructed to give the office a call back with any questions.

## 2019-07-09 ENCOUNTER — Ambulatory Visit: Payer: Medicaid Other

## 2019-07-17 ENCOUNTER — Other Ambulatory Visit: Payer: Self-pay | Admitting: *Deleted

## 2019-07-17 DIAGNOSIS — Z20822 Contact with and (suspected) exposure to covid-19: Secondary | ICD-10-CM

## 2019-07-17 DIAGNOSIS — Z20828 Contact with and (suspected) exposure to other viral communicable diseases: Secondary | ICD-10-CM | POA: Diagnosis not present

## 2019-07-19 LAB — NOVEL CORONAVIRUS, NAA

## 2019-07-22 ENCOUNTER — Encounter (HOSPITAL_COMMUNITY): Payer: Self-pay | Admitting: Emergency Medicine

## 2019-07-22 ENCOUNTER — Other Ambulatory Visit: Payer: Self-pay

## 2019-07-22 ENCOUNTER — Emergency Department (HOSPITAL_COMMUNITY)
Admission: EM | Admit: 2019-07-22 | Discharge: 2019-07-22 | Discharge status: Absent without leave | Payer: Medicaid Other

## 2019-07-22 ENCOUNTER — Ambulatory Visit (INDEPENDENT_AMBULATORY_CARE_PROVIDER_SITE_OTHER): Payer: Medicaid Other

## 2019-07-22 ENCOUNTER — Ambulatory Visit (HOSPITAL_COMMUNITY)
Admission: EM | Admit: 2019-07-22 | Discharge: 2019-07-22 | Disposition: A | Discharge status: Home / Self Care | Payer: Medicaid Other

## 2019-07-22 DIAGNOSIS — M5442 Lumbago with sciatica, left side: Secondary | ICD-10-CM | POA: Diagnosis not present

## 2019-07-22 DIAGNOSIS — Z3202 Encounter for pregnancy test, result negative: Secondary | ICD-10-CM | POA: Diagnosis not present

## 2019-07-22 DIAGNOSIS — M545 Low back pain: Secondary | ICD-10-CM | POA: Diagnosis not present

## 2019-07-22 DIAGNOSIS — S161XXA Strain of muscle, fascia and tendon at neck level, initial encounter: Secondary | ICD-10-CM

## 2019-07-22 DIAGNOSIS — S3992XA Unspecified injury of lower back, initial encounter: Secondary | ICD-10-CM | POA: Diagnosis not present

## 2019-07-22 LAB — POC URINE PREG, ED: Preg Test, Ur: NEGATIVE

## 2019-07-22 LAB — POCT PREGNANCY, URINE: Preg Test, Ur: NEGATIVE

## 2019-07-22 MED ORDER — CYCLOBENZAPRINE HCL 5 MG PO TABS: 5.0000 mg | ORAL_TABLET | Freq: Two times a day (BID) | ORAL | 0 refills | Status: DC | PRN

## 2019-07-22 MED ORDER — PREDNISONE 10 MG (21) PO TBPK: ORAL_TABLET | Freq: Every day | ORAL | 0 refills | Status: DC

## 2019-07-22 NOTE — Discharge Instructions (Signed)
Prednisone taper  over the next 6 days You may use flexeril as needed to help with pain. This is a muscle relaxer and causes sedation- please use only at bedtime or when you will be home and not have to drive/work Gentle stertching Alternate ice and heat  Follow up if not improving or worsening
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
## 2019-07-23 ENCOUNTER — Ambulatory Visit: Payer: Medicaid Other

## 2019-09-16 ENCOUNTER — Encounter (HOSPITAL_COMMUNITY): Payer: Self-pay

## 2019-09-16 ENCOUNTER — Other Ambulatory Visit: Payer: Self-pay

## 2019-09-16 ENCOUNTER — Ambulatory Visit (HOSPITAL_COMMUNITY)
Admission: EM | Admit: 2019-09-16 | Discharge: 2019-09-16 | Disposition: A | Discharge status: Home / Self Care | Payer: Medicaid Other | Attending: Family Medicine | Admitting: Family Medicine

## 2019-09-16 DIAGNOSIS — M5442 Lumbago with sciatica, left side: Secondary | ICD-10-CM | POA: Insufficient documentation

## 2019-09-16 DIAGNOSIS — Z3202 Encounter for pregnancy test, result negative: Secondary | ICD-10-CM | POA: Diagnosis not present

## 2019-09-16 DIAGNOSIS — G8929 Other chronic pain: Secondary | ICD-10-CM | POA: Insufficient documentation

## 2019-09-16 DIAGNOSIS — N898 Other specified noninflammatory disorders of vagina: Secondary | ICD-10-CM | POA: Insufficient documentation

## 2019-09-16 LAB — POCT URINALYSIS DIP (DEVICE)
Bilirubin Urine: NEGATIVE
Glucose, UA: NEGATIVE mg/dL
Hgb urine dipstick: NEGATIVE
Ketones, ur: 15 mg/dL — AB
Nitrite: NEGATIVE
Protein, ur: NEGATIVE mg/dL
Specific Gravity, Urine: >=1.03 (ref 1.005–1.030)
Urobilinogen, UA: 0.2 mg/dL (ref 0.0–1.0)
pH: 5.5 (ref 5.0–8.0)

## 2019-09-16 LAB — POC URINE PREG, ED: Preg Test, Ur: NEGATIVE

## 2019-09-16 LAB — POCT PREGNANCY, URINE: Preg Test, Ur: NEGATIVE

## 2019-09-16 MED ORDER — FLUCONAZOLE 150 MG PO TABS: 150.0000 mg | ORAL_TABLET | Freq: Every day | ORAL | 0 refills | Status: DC
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
## 2019-09-16 NOTE — ED Provider Notes (Signed)
[facility]    CSN: [id number] Arrival date & time: 09/16/19  8588      History   Chief Complaint Chief Complaint  Patient presents with  . Back Pain  . Vaginitis    HPI Bailey Rose is a 24 y.o. female.   Pt is a 24 year old female that presents with lower back pain.  This is been a chronic issue for her for the past few months.  She was involved in [facility] in January and since has been having intermittent, waxing waning back pain.  She also reports previous back injury in the [employer].  Sometimes the pain radiates down the left buttocks into the left upper leg and has some associated numbness and tingling.  She has tried multiple different NSAIDs and muscle relaxers that she reports helps from time to time but she hates taking medication.  Denies any fever, dysuria, hematuria urinary frequency.  She is requesting referral to physical therapy.  Was previously doing physical therapy and getting massages which helped.   She is also complaining of vaginal discharge, odor and intermittent pelvic cramping.  Describes the discharge as thick, yellow/white and mild itching at times.  This has been for the past few days. currently sexually active. No specific concern for STDs but would like to be tested. Patient's last menstrual period was 09/05/2019.   ROS per HPI      Past Medical History:  Diagnosis Date  . Asthma   . Migraines   . Polycystic ovarian syndrome   . Sickle cell trait [facility])     Patient Active Problem List   Diagnosis Date Noted  . Vaginal discharge 04/25/2019  . Vaginal odor 04/25/2019  . Follow-up visit after miscarriage 04/25/2019  . Encounter for Nexplanon removal 07/24/2017  . Breakthrough bleeding on Nexplanon 08/07/2015  . PCOS (polycystic ovarian syndrome) 03/18/2015  . Acne cystica 02/10/2015  . Dysmenorrhea 06/24/2013    Past Surgical History:  Procedure Laterality Date  . TONSILLECTOMY    . WISDOM TOOTH EXTRACTION  2017    OB History     Gravida  2   Para  0   Term  0   Preterm  0   AB  2   Living  0     SAB  2   TAB  0   Ectopic  0   Multiple  0   Live Births  0            Home Medications    Prior to Admission medications   Medication Sig Start Date End Date Taking? Authorizing Provider  acetaminophen-codeine (TYLENOL #3) 300-30 MG tablet Take 2 tablets by mouth every 6 (six) hours as needed for moderate pain. 04/03/19   Marylene Land, CNM  albuterol (VENTOLIN HFA) 108 (90 Base) MCG/ACT inhaler Inhale 1-2 puffs into the lungs every 6 (six) hours as needed for wheezing or shortness of breath. 04/03/19   Marylene Land, CNM  cyclobenzaprine (FLEXERIL) 5 MG tablet Take 1-2 tablets (5-10 mg total) by mouth 2 (two) times daily as needed for muscle spasms. 07/22/19   Wieters, Hallie C, PA-C  fluconazole (DIFLUCAN) 150 MG tablet Take 1 tablet (150 mg total) by mouth daily. 09/16/19   Dahlia Byes A, NP  ibuprofen (ADVIL) 800 MG tablet Take 1 tablet (800 mg total) by mouth every 8 (eight) hours as needed. 04/03/19   Marylene Land, CNM  meloxicam (MOBIC) 7.5 MG tablet Take 7.5 mg by mouth daily.  [provider]  budesonide (PULMICORT) 180 MCG/ACT inhaler Inhale 2 puffs into the lungs 2 (two) times daily after a meal. Patient not taking: Reported on 04/25/2019 04/03/19 07/22/19  Marylene Land, CNM  misoprostol (CYTOTEC) 200 MCG tablet Place one pill between gum and cheek on each side and two pills in the vagina. If no bleeding by Sunday morning, repeat. Patient not taking: Reported on 04/25/2019 04/03/19 07/22/19  Marylene Land, CNM    Family History Family History  Problem Relation Age of Onset  . Sickle cell anemia Mother   . Cancer Maternal Grandmother   . Migraines Maternal Grandmother     Social History Social History   Tobacco Use  . Smoking status: Light Tobacco Smoker    Types: Cigarettes  . Smokeless tobacco: Never Used   Substance Use Topics  . Alcohol use: No    Alcohol/week: 0.0 standard drinks  . Drug use: Not Currently    Types: Marijuana     Allergies   Patient has no known allergies.   Review of Systems Review of Systems   Physical Exam Triage Vital Signs ED Triage Vitals  Enc Vitals Group     BP 09/16/19 0843 112/77     Pulse Rate 09/16/19 0843 96     Resp 09/16/19 0843 18     Temp 09/16/19 0843 99.1 F (37.3 C)     Temp Source 09/16/19 0843 Oral     SpO2 09/16/19 0843 98 %     Weight --      Height --      Head Circumference --      Peak Flow --      Pain Score 09/16/19 0847 6     Pain Loc --      Pain Edu? --      Excl. in GC? --    No data found.  Updated Vital Signs BP 112/77 (BP Location: Right Arm)   Pulse 96   Temp 99.1 F (37.3 C) (Oral)   Resp 18   LMP 09/05/2019 Comment: PCOS  SpO2 98%   Visual Acuity Right Eye Distance:   Left Eye Distance:   Bilateral Distance:    Right Eye Near:   Left Eye Near:    Bilateral Near:     Physical Exam   UC Treatments / Results  Labs (all labs ordered are listed, but only abnormal results are displayed) Labs Reviewed  POCT URINALYSIS DIP (DEVICE) - Abnormal; Notable for the following components:      Result Value   Ketones, ur 15 (*)    Leukocytes,Ua TRACE (*)    All other components within normal limits  URINE CULTURE  POC URINE PREG, ED  POCT PREGNANCY, URINE  CERVICOVAGINAL ANCILLARY ONLY    EKG   Radiology No results found.  Procedures Procedures (including critical care time)  Medications Ordered in UC Medications - No data to display  Initial Impression / Assessment and Plan / UC Course  I have reviewed the triage vital signs and the nursing notes.  Pertinent labs & imaging results that were available during my care of the patient were reviewed by me and considered in my medical decision making (see chart for details).     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 No appreciable arthropathy. No fracture or spondylolisthesis.  Was treated at that time with prednisone, Flexeril which she reports helped  some but the back pain is still recurrent. Requesting referral to physical therapy.  Ambulatory referral for physical therapy placed  Vaginal discharge-based on symptoms we will go ahead and treat for yeast infection.  Diflucan as prescribed. Swab sent for testing Urine with trace leuks.  Will send for culture. Pregnancy test negative Final Clinical Impressions(s) / UC Diagnoses   Final diagnoses:  Vaginal discharge  Chronic midline low back pain with left-sided sciatica     Discharge Instructions     I have sent in a referral for you for physical therapy.  They should be giving a call. We will send a swab for testing and call you with any positive results. Treating you for yeast infection.  Take 1 tab now and 1 tab in 3 days if still having symptoms. Follow up as needed for continued or worsening symptoms     ED Prescriptions    Medication Sig Dispense Auth. Provider   fluconazole (DIFLUCAN) 150 MG tablet Take 1 tablet (150 mg total) by mouth daily. 2 tablet Loura Halt A, NP     PDMP not reviewed this encounter.   Loura Halt A, NP 09/16/19 [phone]

## 2019-09-16 NOTE — Discharge Instructions (Addendum)
I have sent in a referral for you for physical therapy.  They should be giving a call. We will send a swab for testing and call you with any positive results. Treating you for yeast infection.  Take 1 tab now and 1 tab in 3 days if still having symptoms. Follow up as needed for continued or worsening symptoms

## 2019-09-18 ENCOUNTER — Telehealth (HOSPITAL_COMMUNITY): Payer: Self-pay | Admitting: Emergency Medicine

## 2019-09-18 LAB — CERVICOVAGINAL ANCILLARY ONLY
Bacterial vaginitis: POSITIVE — AB
Candida vaginitis: NEGATIVE
Chlamydia: NEGATIVE
Neisseria Gonorrhea: NEGATIVE
Trichomonas: NEGATIVE

## 2019-09-18 LAB — URINE CULTURE: Culture: 10000 — AB

## 2019-09-18 MED ORDER — METRONIDAZOLE 500 MG PO TABS: 500.0000 mg | ORAL_TABLET | Freq: Two times a day (BID) | ORAL | 0 refills | Status: AC

## 2019-09-18 NOTE — Telephone Encounter (Signed)
Bacterial vaginosis is positive. Pt needs treatment. Flagyl 500 mg BID x 7 days #14 no refills sent to patients pharmacy of choice.    Patient contacted by phone and made aware of    results. Pt verbalized understanding and had all questions answered.    

## 2019-09-24 ENCOUNTER — Ambulatory Visit: Payer: Medicaid Other

## 2019-11-26 ENCOUNTER — Other Ambulatory Visit (HOSPITAL_COMMUNITY)
Admission: RE | Admit: 2019-11-26 | Discharge: 2019-11-26 | Disposition: A | Discharge status: Home / Self Care | Payer: Medicaid Other | Source: Ambulatory Visit | Attending: Family Medicine | Admitting: Family Medicine

## 2019-11-26 ENCOUNTER — Ambulatory Visit (INDEPENDENT_AMBULATORY_CARE_PROVIDER_SITE_OTHER): Payer: Medicaid Other | Admitting: Family Medicine

## 2019-11-26 ENCOUNTER — Other Ambulatory Visit: Payer: Self-pay

## 2019-11-26 ENCOUNTER — Encounter: Payer: Self-pay | Admitting: Family Medicine

## 2019-11-26 VITALS — BP 114/73 | HR 89 | Wt 177.5 lb

## 2019-11-26 DIAGNOSIS — Z0000 Encounter for general adult medical examination without abnormal findings: Secondary | ICD-10-CM | POA: Diagnosis not present

## 2019-11-26 DIAGNOSIS — Z01419 Encounter for gynecological examination (general) (routine) without abnormal findings: Secondary | ICD-10-CM | POA: Insufficient documentation

## 2019-11-26 DIAGNOSIS — R350 Frequency of micturition: Secondary | ICD-10-CM | POA: Diagnosis not present

## 2019-11-26 DIAGNOSIS — N912 Amenorrhea, unspecified: Secondary | ICD-10-CM

## 2019-11-26 DIAGNOSIS — Z1331 Encounter for screening for depression: Secondary | ICD-10-CM

## 2019-11-26 NOTE — Progress Notes (Signed)
   Subjective:    Patient ID: Bailey Rose is a 24 y.o. female presenting with Gynecologic Exam  on 11/26/2019  HPI: Having monthly cycles. Reports fatigue increasingly. Appetite is ok, seems to be gaining weight. Reports frequent urination, feels pressure with urinating. Wants full testing. Reports actively trying to get pregnant. 2 days late for cycle. Reports being told she has PCOS--reviewed 3-4 prior u/s with minimal findings of PCOS with pt.  Review of Systems  Constitutional: Negative for chills and fever.  Respiratory: Negative for shortness of breath.   Cardiovascular: Negative for chest pain.  Gastrointestinal: Negative for abdominal pain, nausea and vomiting.  Genitourinary: Negative for dysuria.  Skin: Negative for rash.      Objective:    BP 114/73   Pulse 89   Wt 177 lb 8 oz (80.5 kg)   LMP  (Exact Date)   BMI 33.54 kg/m  Physical Exam Constitutional:      General: She is not in acute distress.    Appearance: She is well-developed.  HENT:     Head: Normocephalic and atraumatic.  Eyes:     General: No scleral icterus.    Pupils: Pupils are equal, round, and reactive to light.  Neck:     Thyroid: No thyromegaly.  Cardiovascular:     Rate and Rhythm: Normal rate and regular rhythm.  Pulmonary:     Effort: Pulmonary effort is normal.     Breath sounds: Normal breath sounds.  Chest:     Breasts:        Right: Normal. No inverted nipple or mass.        Left: Normal. No inverted nipple or mass.  Abdominal:     General: There is no distension.     Palpations: Abdomen is soft.     Tenderness: There is no abdominal tenderness.  Genitourinary:    Comments: BUS normal, vagina is pink and rugated, cervix is nulliparous without lesion, uterus is small and anteverted, no adnexal mass or tenderness.  Musculoskeletal:     Cervical back: Normal range of motion and neck supple.  Skin:    General: Skin is warm and dry.  Neurological:     Mental Status: She is  alert and oriented to person, place, and time.         Assessment & Plan:   Problem List Items Addressed This Visit    None    Visit Diagnoses    Positive depression screening    -  Primary   Relevant Orders   Ambulatory referral to Integrated Behavioral Health   Encounter for gynecological examination without abnormal finding       Relevant Orders   CBC   TSH   Comprehensive metabolic panel   Lipid panel   VITAMIN D 25 Hydroxy (Vit-D Deficiency, Fractures)   Cytology - PAP( [facility])   Urinary frequency       Relevant Orders   Hemoglobin A1c   Amenorrhea       Relevant Orders   Beta hCG quant (ref lab)      Return in about 1 year (around 11/25/2020), or if symptoms worsen or fail to improve, for a CPE.  Reva Bores 11/26/2019 3:59 PM

## 2019-11-26 NOTE — BH Specialist Note (Signed)
Integrated Behavioral Health via Telemedicine Phone Visit  11/26/2019 Bailey Rose [id number]  Number of O[place] visits: 1 Session Start time: 8:17  Session End time: 8:56 Total time: 32  Referring Provider: Darron Doom, MD Type of Visit: Phone Patient/Family location: Home [facility] Provider location: [facility] [place] [facility]  All persons participating in visit: Patient Bailey Rose and [place]     Confirmed patient's address: Yes  Confirmed patient's phone number: Yes  Any changes to demographics: No   Confirmed patient's insurance: Yes  Any changes to patient's insurance: No   Discussed confidentiality: Yes   I connected with Bailey Rose  by a video enabled telemedicine application and verified that I am speaking with the correct person using two identifiers.     I discussed the limitations of evaluation and management by telemedicine and the availability of in person appointments.  I discussed that the purpose of this visit is to provide behavioral health care while limiting exposure to the novel coronavirus.   Discussed there is a possibility of technology failure and discussed alternative modes of communication if that failure occurs.  I discussed that engaging in this phone visit, they consent to the provision of behavioral healthcare and the services will be billed under their insurance.  Patient and/or legal guardian expressed understanding and consented to video visit: Yes   PRESENTING CONCERNS: Patient and/or family reports the following symptoms/concerns: Pt states her primary concern today is feeling frustrated and stressed at work, not enjoying work as previous, fatigue and oversleeping, depressed mood, starting after miscarriage in 03/2019, and increasing in recent months, attrributed to work stress;  pt coped best when she had an emotional support dog.  Pt also concerned about boyfriend's  depression. Pt prefers no medication.  Duration of problem: Began 8 months ago, increase in recent months; Severity of problem: moderate  STRENGTHS (Protective Factors/Coping Skills): Self-awareness; can identify coping strategy  GOALS ADDRESSED: Patient will: 1.  Reduce symptoms of: depression and stress  2.  Increase knowledge and/or ability of: healthy habits, self-management skills and stress reduction  3.  Demonstrate ability to: Increase healthy adjustment to current life circumstances and Increase motivation to adhere to plan of care  INTERVENTIONS: Interventions utilized:  Solution-Focused Strategies, Veterinary surgeon, Psychoeducation and/or Health Education and Link to [employer] Assessments completed: PHQ9/GAD7 given in past two weeks  ASSESSMENT: Patient currently experiencing Adjustment disorder with depressed mood.   Patient may benefit from psychoeducation and brief therapeutic interventions regarding coping with symptoms of depression and life stress .  PLAN: 1. Follow up with behavioral health clinician on : Two weeks 2. Behavioral recommendations:  -Establish care with PCP; discuss need for emotional support animal with PCP -Begin writing affirmations every morning upon waking in journal -Consider behavioral health resources (crisis numbers if suicidal ideation returns for self or boyfriend, along with therapy options local and in [place], [place] area) on After Visit Summary -Consider using apps for self-care during work breaks, as needed 3. Referral(s): [place] (In Clinic), [place] (LME/Outside Clinic) and Community Resources:  [facility] resources   I discussed the assessment and treatment plan with the patient and/or parent/guardian. They were provided an opportunity to ask questions and all were answered. They agreed with the plan and demonstrated an understanding of the instructions.   They  were advised to call back or seek an in-person evaluation if the symptoms worsen or if the condition fails  to improve as anticipated.  Valetta Close Bailey Rose  Depression screen [facility] 2/9 11/26/2019  Decreased Interest 3  Down, Depressed, Hopeless 1  PHQ - 2 Score 4  Altered sleeping 3  Tired, decreased energy 3  Change in appetite 2  Feeling bad or failure about yourself  0  Trouble concentrating 1  Moving slowly or fidgety/restless 1  Suicidal thoughts 0  PHQ-9 Score 14   GAD 7 : Generalized Anxiety Score 11/26/2019  Nervous, Anxious, on Edge 1  Control/stop worrying 1  Worry too much - different things 1  Trouble relaxing 1  Restless 1  Easily annoyed or irritable 1  Afraid - awful might happen 1  Total GAD 7 Score 7

## 2019-11-26 NOTE — Patient Instructions (Addendum)
If you are in need of transportation to get to and from your appointments in our office.  You can reach Transportation Services by calling [phone] Monday - Friday  7am-6pm. Preventive Care 22-22 Years Old, Female Preventive care refers to visits with your health care provider and lifestyle choices that can promote health and wellness. This includes:  A yearly physical exam. This may also be called an annual well check.  Regular dental visits and eye exams.  Immunizations.  Screening for certain conditions.  Healthy lifestyle choices, such as eating a healthy diet, getting regular exercise, not using drugs or products that contain nicotine and tobacco, and limiting alcohol use. What can I expect for my preventive care visit? Physical exam Your health care provider will check your:  Height and weight. This may be used to calculate body mass index (BMI), which tells if you are at a healthy weight.  Heart rate and blood pressure.  Skin for abnormal spots. Counseling Your health care provider may ask you questions about your:  Alcohol, tobacco, and drug use.  Emotional well-being.  Home and relationship well-being.  Sexual activity.  Eating habits.  Work and work Statistician.  Method of birth control.  Menstrual cycle.  Pregnancy history. What immunizations do I need?  Influenza (flu) vaccine  This is recommended every year. Tetanus, diphtheria, and pertussis (Tdap) vaccine  You may need a Td booster every 10 years. Varicella (chickenpox) vaccine  You may need this if you have not been vaccinated. Human papillomavirus (HPV) vaccine  If recommended by your health care provider, you may need three doses over 6 months. Measles, mumps, and rubella (MMR) vaccine  You may need at least one dose of MMR. You may also need a second dose. Meningococcal conjugate (MenACWY) vaccine  One dose is recommended if you are age 22-21 years and a first-year college student  living in a residence hall, or if you have one of several medical conditions. You may also need additional booster doses. Pneumococcal conjugate (PCV13) vaccine  You may need this if you have certain conditions and were not previously vaccinated. Pneumococcal polysaccharide (PPSV23) vaccine  You may need one or two doses if you smoke cigarettes or if you have certain conditions. Hepatitis A vaccine  You may need this if you have certain conditions or if you travel or work in places where you may be exposed to hepatitis A. Hepatitis B vaccine  You may need this if you have certain conditions or if you travel or work in places where you may be exposed to hepatitis B. Haemophilus influenzae type b (Hib) vaccine  You may need this if you have certain conditions. You may receive vaccines as individual doses or as more than one vaccine together in one shot (combination vaccines). Talk with your health care provider about the risks and benefits of combination vaccines. What tests do I need?  Blood tests  Lipid and cholesterol levels. These may be checked every 5 years starting at age 64.  Hepatitis C test.  Hepatitis B test. Screening  Diabetes screening. This is done by checking your blood sugar (glucose) after you have not eaten for a while (fasting).  Sexually transmitted disease (STD) testing.  BRCA-related cancer screening. This may be done if you have a family history of breast, ovarian, tubal, or peritoneal cancers.  Pelvic exam and Pap test. This may be done every 3 years starting at age 51. Starting at age 37, this may be done every 5 years if  you have a Pap test in combination with an HPV test. Talk with your health care provider about your test results, treatment options, and if necessary, the need for more tests. Follow these instructions at home: Eating and drinking   Eat a diet that includes fresh fruits and vegetables, whole grains, lean protein, and low-fat  dairy.  Take vitamin and mineral supplements as recommended by your health care provider.  Do not drink alcohol if: ? Your health care provider tells you not to drink. ? You are pregnant, may be pregnant, or are planning to become pregnant.  If you drink alcohol: ? Limit how much you have to 0-1 drink a day. ? Be aware of how much alcohol is in your drink. In the [place]., one drink equals one 12 oz bottle of beer (355 mL), one 5 oz glass of wine (148 mL), or one 1 oz glass of hard liquor (44 mL). Lifestyle  Take daily care of your teeth and gums.  Stay active. Exercise for at least 30 minutes on 5 or more days each week.  Do not use any products that contain nicotine or tobacco, such as cigarettes, e-cigarettes, and chewing tobacco. If you need help quitting, ask your health care provider.  If you are sexually active, practice safe sex. Use a condom or other form of birth control (contraception) in order to prevent pregnancy and STIs (sexually transmitted infections). If you plan to become pregnant, see your health care provider for a preconception visit. What's next?  Visit your health care provider once a year for a well check visit.  Ask your health care provider how often you should have your eyes and teeth checked.  Stay up to date on all vaccines. This information is not intended to replace advice given to you by your health care provider. Make sure you discuss any questions you have with your health care provider. Document Revised: 03/14/2018 Document Reviewed: 03/14/2018 Elsevier Patient Education  2020 [employer].

## 2019-11-26 NOTE — Progress Notes (Signed)
Pt registered with [place] non emergency transportation. [place] waiver signed at visit today.   Fleet Contras RN 11/26/19

## 2019-11-27 ENCOUNTER — Ambulatory Visit (INDEPENDENT_AMBULATORY_CARE_PROVIDER_SITE_OTHER): Payer: Medicaid Other | Admitting: Clinical

## 2019-11-27 ENCOUNTER — Telehealth: Payer: Self-pay

## 2019-11-27 DIAGNOSIS — O3680X0 Pregnancy with inconclusive fetal viability, not applicable or unspecified: Secondary | ICD-10-CM

## 2019-11-27 DIAGNOSIS — F4321 Adjustment disorder with depressed mood: Secondary | ICD-10-CM

## 2019-11-27 LAB — COMPREHENSIVE METABOLIC PANEL
ALT: 8 IU/L (ref 0–32)
AST: 11 IU/L (ref 0–40)
Albumin/Globulin Ratio: 1.5 (ref 1.2–2.2)
Albumin: 4.5 g/dL (ref 3.9–5.0)
Alkaline Phosphatase: 76 IU/L (ref 39–117)
BUN/Creatinine Ratio: 14 (ref 9–23)
BUN: 10 mg/dL (ref 6–20)
Bilirubin Total: <0.2 mg/dL (ref 0.0–1.2)
CO2: 21 mmol/L (ref 20–29)
Calcium: 9.7 mg/dL (ref 8.7–10.2)
Chloride: 105 mmol/L (ref 96–106)
Creatinine, Ser: 0.74 mg/dL (ref 0.57–1.00)
GFR calc Af Amer: 134 mL/min/{1.73_m2} (ref >59)
GFR calc non Af Amer: 116 mL/min/{1.73_m2} (ref >59)
Globulin, Total: 3 g/dL (ref 1.5–4.5)
Glucose: 89 mg/dL (ref 65–99)
Potassium: 4.6 mmol/L (ref 3.5–5.2)
Sodium: 139 mmol/L (ref 134–144)
Total Protein: 7.5 g/dL (ref 6.0–8.5)

## 2019-11-27 LAB — LIPID PANEL
Chol/HDL Ratio: 2.5 ratio (ref 0.0–4.4)
Cholesterol, Total: 89 mg/dL — ABNORMAL LOW (ref 100–199)
HDL: 35 mg/dL — ABNORMAL LOW (ref >39)
LDL Chol Calc (NIH): 38 mg/dL (ref 0–99)
Triglycerides: 77 mg/dL (ref 0–149)
VLDL Cholesterol Cal: 16 mg/dL (ref 5–40)

## 2019-11-27 LAB — CBC
Hematocrit: 43.8 % (ref 34.0–46.6)
Hemoglobin: 14.4 g/dL (ref 11.1–15.9)
MCH: 24.4 pg — ABNORMAL LOW (ref 26.6–33.0)
MCHC: 32.9 g/dL (ref 31.5–35.7)
MCV: 74 fL — ABNORMAL LOW (ref 79–97)
Platelets: 237 10*3/uL (ref 150–450)
RBC: 5.91 x10E6/uL — ABNORMAL HIGH (ref 3.77–5.28)
RDW: 15.8 % — ABNORMAL HIGH (ref 11.7–15.4)
WBC: 9.9 10*3/uL (ref 3.4–10.8)

## 2019-11-27 LAB — VITAMIN D 25 HYDROXY (VIT D DEFICIENCY, FRACTURES): Vit D, 25-Hydroxy: 35.6 ng/mL (ref 30.0–100.0)

## 2019-11-27 LAB — TSH: TSH: 1.09 u[IU]/mL (ref 0.450–4.500)

## 2019-11-27 LAB — HEMOGLOBIN A1C
Est. average glucose Bld gHb Est-mCnc: 100 mg/dL
Hgb A1c MFr Bld: 5.1 % (ref 4.8–5.6)

## 2019-11-27 LAB — BETA HCG QUANT (REF LAB): hCG Quant: 8 m[IU]/mL

## 2019-11-27 NOTE — Telephone Encounter (Signed)
Per Dr. Shawnie Pons pt needs an [place] scheduled for the week of June 1st.  OB US scheduled for December 18, 2019 @ 0900.  Pt notified of appt.  Pt stated that she will be going to the appt.  I expressed the importance of going to the appt to make sure that her pregnancy progresses as it should.  Pt verbalized understanding with no further questions.   Addison Naegeli, RN  11/27/19

## 2019-11-27 NOTE — Patient Instructions (Addendum)
[place] primary care offices accepting new patients:   Primary Care at [facility] [address] [place], [place] [address] [phone]  [facility] at [place] [facility] [place], [place] [address] [place] and [facility] [address] [place], [place] [address] [place] [address] [place], [place] [address] [phone]  Patient [place]. [place],  [place]  [address] [phone]  Behavioral Health Resources:   What if I or someone I know is in crisis?  . If you are thinking about harming yourself or having thoughts of suicide, or if you know someone who is, seek help right away.  . Call your doctor or mental health care provider.  . Call 911 or go to a hospital emergency room to get immediate help, or ask a friend or family member to help you do these things.  . Call the [place] National Suicide Prevention Lifeline's toll-free, 24-hour hotline at 1-800-273-TALK 740-573-0949) or TTY: 1-800-799-4 TTY 445 286 7300) to talk to a trained counselor.  . If you are in crisis, make sure you are not left alone.   . If someone else is in crisis, make sure he or she is not left alone   24 Hour :   [place] National Suicide Hotline: (412)379-6773  Therapeutic Alternative Mobile Crisis: 980-804-7818   [facility]  [address], [place], [place] [address]  [phone] or [phone]  [facility] [employer] (Domestic Violence, Rape & Victim Assistance)  [phone]  [place]  [address], [place]  [address]   [phone] or [phone]   [place]: [phone] (8am-4pm) or ([phone]) [phone] (after hours)           [facility], [address] [place], [place] Fax: [phone]  www.NailBuddies.[email or web address]  *Interpreters available *Accepts Medicaid, Medicare, uninsured  [facility]   Mon-Fri: 8am-5pm [address] [place], [place] [phone](phone); [phone]) BloggerCourse.com  *Accepts Medicare  [facility] [place], Fri: 8am-4pm [address] [place], [place] (phone); [phone] (fax) TaskTown.es  *[place] Mon-Fri: 9am-5pm  [address], [place], [place] (phone); [phone]  https://www.bond-[email or web address]  *Accepts Medicaid  [facility] [address], [place], [place]  SalonLookup.es   [facility], 8:30am-12pm/1pm-2:30pm [address] [place], [place] (phone); ([phone] (fax) www.fspcares.org  *Accepts Medicaid, sliding-scale*Bilingual services available  [facility] Mon-Fri, 8am-7pm [place], [place]  [phone](phone); [phone]) www.famsolutions.org  *Accepts Medicaid *Bilingual services available  [facility] Mon-Fri: 8am-5pm, Saturday by appointment only [place], [place], [place] (phone); [phone] (fax) www.[email or web address]   [facility] [address] [place], [place], [place] www.[email or web address]  *Free & reduced services for uninsured and underinsured individuals *Bilingual services for Spanish-speaking clients 21 and under  [facility], [address] [place], [place]); ([phone]) RunningConvention.de  *Bring your own interpreter at first visit *Accepts Medicare and [facility]  [place] Mon-Fri: 9am-5:30pm [address], [place], [place], [place] (phone), [phone] (fax) After hours crisis line: [phone] www.[email or web address]  *Accepts Medicare and Medicaid  [employer], 8am-6pm [address] [place], [place] (phone); [phone] (fax) http://[email or web address]  *Subsidized costs  available  [facility] Mon-Fri: 8am-4pm [address], [place], [place] [phone](JDBZM); [phone](fax) www.psychotherapeuticservices.com  *Accepts Medicaid  [facility] Same day access hours: Mon-Fri, 8:30-3pm Crisis hours: Mon-Fri, 8am-5pm [address], [place], [place]  [facility] Same day access hours: Mon-Fri, 8:30-3pm Crisis hours: Mon-Fri, 8am-8pm [address] [place], [place] [phone] (phone); [phone] (fax) www.rhahealthservices.org  *Accepts Medicaid and Medicare  [facility] [place], [place], Fri: 9am-9pm Tues, Thurs: 9am-6pm [address] [place], [place], [place]  [phone] (phone); [phone] (fax) https://[email or web address]  *(Accepts Medicare and Medicaid; payment plans available)*Bilingual services available  [facility] [address] [place], [place] [phone] (phone); [phone] (fax) www.santecounseling.com   [facility] [address], [place], [place]  [phone]  RackRewards.fr  *Bilingual services available  [facility] (Social and Emotional Learning) Mon-Thurs: 8am-8pm [address], [place], [place] [phone] (phone); ([phone] (fax) ScrapbookLive.si  *Accepts Medicaid*Bilingual services available  [facility] [address], [place], [place] [phone] (phone) BrotherBig.at  *Accepts Medicaid *Bilingual services available   [facility] Mon-Fri, 9am-4:45pm [address] [place], [place] [phone] (phone); [phone] (fax) http://tlc-[email or web address]  *Accepts Medicare  [facility] Mon-Thurs: 8:30-8pm, Fri: 8:30am-7pm [address], [place], [place] (3rd floor) [phone] (phone); [phone] (fax) https://www.warren.[email or web address]  *Accepts Medicaid; income-based reduced rates available  [facility] Mon-Fri: 8am-5pm [address], [place], [place] [phone] (phone); [phone] (fax) http://www.[email or web address]  *Accepts Medicaid*Bilingual services available  [facility] [address], [address] [place], [place], [place]  [phone] (phone); ([phone] (fax) www.youthfocus.org  *Free emergency housing and clinical services for youth in crisis  [facility] (Mental Health Association of [place])  [address], [place] [phone] www.mhag.org  *Provides direct services to individuals in recovery from mental illness, including support groups, recovery skills classes, and one on one peer support  [employer] on Mental Illness) [place] helpline: 463-516-4400  https://namiguilford.org  *A community hub for information relating to local resources and services for the friends and families of individuals living alongside a mental health condition, as well as the individuals themselves. Classes and support groups also provided    In [place]:  [facility]  [address], [place], [place] [address] SkincareIndustry.[email or web address]   [facility] [address], [place], [place] [address] [phone] Https://[email or web address]-[email or web address]-[email or web address]  [facility] [address], [place], [place] [phone] [phone]  /Emotional Wellbeing Apps and Websites Here are a few free apps meant to help you to help yourself.   To find, try searching on the internet to see if the app is offered on Apple/Android devices. If your first choice doesn't come up on your device, the good news is that there are many choices! Play around with different apps to see which ones are helpful to you.    Calm This is an app meant to help increase calm feelings. Includes info, strategies, and tools for tracking your feelings.      Calm Harm  This app is meant to help with self-harm. Provides many 5-minute or 15-min coping strategies for doing instead of hurting yourself.       Healthy Minds Health Minds is a problem-solving tool to help deal with emotions and cope with stress you encounter wherever you are.      MindShift This app can help people cope with anxiety. Rather than trying to avoid anxiety, you can make an important shift and face it.      MY3  MY3 features a  support system, safety plan and resources with the goal of offering a tool to use in a time of need.       My Life My Voice  This mood journal offers a simple solution for tracking your thoughts, feelings and moods. Animated emoticons can help identify your mood.       Relax Melodies Designed to help with sleep, on this app you can mix sounds and meditations for relaxation.      Smiling Mind Smiling Mind is meditation made easy: it's a simple tool that helps put a smile on your mind.        Stop, Breathe & Think  A friendly, simple guide for people through meditations for mindfulness and compassion.  Stop, Breathe and Think Kids Enter your current feelings and choose a "mission" to help you cope. Offers videos for certain moods instead of just sound recordings.       Team Orange The goal of this tool is to help teens change how they think, act, and react. This app helps you focus on your own good feelings and experiences.      The [employer] Box The [employer] Station[employer] Box (VHB) contains simple tools to help patients with coping,  relaxation, distraction, and positive thinking.

## 2019-11-27 NOTE — Addendum Note (Signed)
Addended by: Reva Bores on: 11/27/2019 09:26 AM   Modules accepted: Orders

## 2019-11-28 LAB — CYTOLOGY - PAP: Diagnosis: NEGATIVE

## 2019-12-03 ENCOUNTER — Telehealth: Payer: Self-pay

## 2019-12-03 NOTE — Telephone Encounter (Signed)
Pt called stating that she has a yeast infection and that she is pregnant.  I called pt and informed pt that we recommend that she takes Monistat over the counter for treatment as this is safe in pregnancy.  Pt verbalized understanding.   Addison Naegeli, RN

## 2019-12-04 NOTE — BH Specialist Note (Signed)
Pt did not arrive to video visit and did not answer the phone ; Left HIPPA-compliant message to call back Asher Muir from Center for [employer] at [facility] at [phone] (main office) or [phone] (Keyandre Pileggi's office).  ; left MyChart message for patient.    Integrated Behavioral Health via Telemedicine Video Visit  12/04/2019 Micki Cassel [phone]  Rae Lips

## 2019-12-05 ENCOUNTER — Telehealth (INDEPENDENT_AMBULATORY_CARE_PROVIDER_SITE_OTHER): Payer: Medicaid Other | Admitting: Obstetrics and Gynecology

## 2019-12-05 ENCOUNTER — Encounter (HOSPITAL_COMMUNITY): Payer: Self-pay | Admitting: Family Medicine

## 2019-12-05 ENCOUNTER — Inpatient Hospital Stay (HOSPITAL_COMMUNITY)
Admission: AD | Admit: 2019-12-05 | Discharge: 2019-12-05 | Disposition: A | Discharge status: Home / Self Care | Payer: Medicaid Other | Attending: Family Medicine | Admitting: Family Medicine

## 2019-12-05 ENCOUNTER — Other Ambulatory Visit: Payer: Self-pay

## 2019-12-05 DIAGNOSIS — M25552 Pain in left hip: Secondary | ICD-10-CM | POA: Diagnosis not present

## 2019-12-05 DIAGNOSIS — O26899 Other specified pregnancy related conditions, unspecified trimester: Secondary | ICD-10-CM | POA: Diagnosis not present

## 2019-12-05 DIAGNOSIS — F17210 Nicotine dependence, cigarettes, uncomplicated: Secondary | ICD-10-CM | POA: Insufficient documentation

## 2019-12-05 DIAGNOSIS — D573 Sickle-cell trait: Secondary | ICD-10-CM | POA: Diagnosis not present

## 2019-12-05 DIAGNOSIS — Z3490 Encounter for supervision of normal pregnancy, unspecified, unspecified trimester: Secondary | ICD-10-CM

## 2019-12-05 DIAGNOSIS — Z3481 Encounter for supervision of other normal pregnancy, first trimester: Secondary | ICD-10-CM

## 2019-12-05 DIAGNOSIS — Z79899 Other long term (current) drug therapy: Secondary | ICD-10-CM | POA: Insufficient documentation

## 2019-12-05 DIAGNOSIS — O99330 Smoking (tobacco) complicating pregnancy, unspecified trimester: Secondary | ICD-10-CM | POA: Diagnosis not present

## 2019-12-05 DIAGNOSIS — E282 Polycystic ovarian syndrome: Secondary | ICD-10-CM | POA: Diagnosis not present

## 2019-12-05 DIAGNOSIS — Z3A00 Weeks of gestation of pregnancy not specified: Secondary | ICD-10-CM | POA: Diagnosis not present

## 2019-12-05 DIAGNOSIS — J45909 Unspecified asthma, uncomplicated: Secondary | ICD-10-CM | POA: Diagnosis not present

## 2019-12-05 DIAGNOSIS — O209 Hemorrhage in early pregnancy, unspecified: Secondary | ICD-10-CM | POA: Insufficient documentation

## 2019-12-05 DIAGNOSIS — N898 Other specified noninflammatory disorders of vagina: Secondary | ICD-10-CM

## 2019-12-05 DIAGNOSIS — O99019 Anemia complicating pregnancy, unspecified trimester: Secondary | ICD-10-CM | POA: Insufficient documentation

## 2019-12-05 DIAGNOSIS — O99280 Endocrine, nutritional and metabolic diseases complicating pregnancy, unspecified trimester: Secondary | ICD-10-CM | POA: Diagnosis not present

## 2019-12-05 DIAGNOSIS — N939 Abnormal uterine and vaginal bleeding, unspecified: Secondary | ICD-10-CM | POA: Diagnosis not present

## 2019-12-05 DIAGNOSIS — O99519 Diseases of the respiratory system complicating pregnancy, unspecified trimester: Secondary | ICD-10-CM | POA: Diagnosis not present

## 2019-12-05 DIAGNOSIS — O4690 Antepartum hemorrhage, unspecified, unspecified trimester: Secondary | ICD-10-CM | POA: Diagnosis present

## 2019-12-05 LAB — WET PREP, GENITAL
Clue Cells Wet Prep HPF POC: NONE SEEN
Sperm: NONE SEEN
Trich, Wet Prep: NONE SEEN
Yeast Wet Prep HPF POC: NONE SEEN

## 2019-12-05 LAB — URINALYSIS, ROUTINE W REFLEX MICROSCOPIC
Bilirubin Urine: NEGATIVE
Glucose, UA: NEGATIVE mg/dL
Ketones, ur: NEGATIVE mg/dL
Nitrite: NEGATIVE
Protein, ur: NEGATIVE mg/dL
Specific Gravity, Urine: 1.014 (ref 1.005–1.030)
pH: 6 (ref 5.0–8.0)

## 2019-12-05 LAB — HCG, QUANTITATIVE, PREGNANCY: hCG, Beta Chain, Quant, S: 1502 m[IU]/mL — ABNORMAL HIGH (ref <5)

## 2019-12-05 LAB — CBC
HCT: 41.8 % (ref 36.0–46.0)
Hemoglobin: 13.4 g/dL (ref 12.0–15.0)
MCH: 23.9 pg — ABNORMAL LOW (ref 26.0–34.0)
MCHC: 32.1 g/dL (ref 30.0–36.0)
MCV: 74.5 fL — ABNORMAL LOW (ref 80.0–100.0)
Platelets: 216 10*3/uL (ref 150–400)
RBC: 5.61 MIL/uL — ABNORMAL HIGH (ref 3.87–5.11)
RDW: 14.6 % (ref 11.5–15.5)
WBC: 11.1 10*3/uL — ABNORMAL HIGH (ref 4.0–10.5)
nRBC: 0 % (ref 0.0–0.2)

## 2019-12-05 NOTE — Telephone Encounter (Signed)
Patient called to say she had been on hold for 11 minutes, and wanted to speak to a nurse right now. I informed her she would get a call back within an hour because the nurse was not available.

## 2019-12-05 NOTE — Telephone Encounter (Signed)
Returned patient's phone call. She reports waking up with a stabbing sharp pain in her breasts. Patient currently rates pain at a 9. She denies chest pain but is adamant it is in her breast. Patient states pain is constant. Discussed with Dr Debroah Loop who advises patient take extra tylenol for improvement of pain. Patient states she cannot take tylenol because it makes her sleepy and she has to go to work. Told patient to try taking Tylenol, get some breakfast, try drinking a lot of water and that should help once she gets moving. Patient states she knows tylenol will help but she cannot go to work like this and take the medication- she is requesting a work note for the day. Told patient we are unable to provide her a work note for the day (per Dr Debroah Loop). Patient asked if she could see someone today for an appointment then. Told patient I wasn't sure I would have to check the schedule and advised the provider would still want her to try to take something. Patient states she doesn't want to take anything because she doesn't like taking medication. Patient asked if there was a midwife she could see today. Told patient we do not have midwives in the office today. Patient asked when a midwife would be here and I told her Monday. Patient stated isn't there a midwife on call. Told patient yes but they do not come into the office. Recommended she try taking the tylenol and if that doesn't help and she is still having a lot of pain this afternoon she can go to Urgent Care. Discussed pain is likely due to early pregnancy and breast changes taking place. Patient states she has been pregnant 3 other times and this has never happened. Patient had no other questions and denied further assistance.

## 2019-12-05 NOTE — Discharge Instructions (Signed)
Human Chorionic Gonadotropin Test Why am I having this test? A human chorionic gonadotropin (hCG) test is done to determine whether you are pregnant. It can also be used:  To diagnose an abnormal pregnancy.  To determine whether you have had a failed pregnancy (miscarriage) or are at risk of one. What is being tested? This test checks the level of the human chorionic gonadotropin (hCG) hormone in the blood. This hormone is produced during pregnancy by the cells that form the placenta. The placenta is the organ that grows inside your womb (uterus) to nourish a developing baby. When you are pregnant, hCG can be detected in your blood or urine 7 to 8 days before your missed period. It continues to go up for the first 8-10 weeks of pregnancy. The presence of hCG in your blood can be measured with several different types of tests. You may have:  A urine test. ? Because this hormone is eliminated from your body by your kidneys, you may have a urine test to find out whether you are pregnant. A home pregnancy test detects whether there is hCG in your urine. ? A urine test only shows whether there is hCG in your urine. It does not measure how much.  A qualitative blood test. ? You may have this type of blood test to find out if you are pregnant. ? This blood test only shows whether there is hCG in your blood. It does not measure how much.  A quantitative blood test. ? This type of blood test measures the amount of hCG in your blood. ? You may have this test to:  Diagnose an abnormal pregnancy.  Check whether you have had a miscarriage.  Determine whether you are at risk of a miscarriage. What kind of sample is taken?     Two kinds of samples may be collected to test for the hCG hormone.  Blood. It is usually collected by inserting a needle into a blood vessel.  Urine. It is usually collected by urinating into a germ-free (sterile) specimen cup. It is best to collect the sample the first  time you urinate in the morning. How do I prepare for this test? No preparation is needed for a blood test.  For the urine test:  Let your health care provider know about: ? All medicines you are taking, including vitamins, herbs, creams, and over-the-counter medicines. ? Any blood in your urine. This may interfere with the result.  Do not drink too much fluid. Drink as you normally would, or as directed by your health care provider. How are the results reported? Depending on the type of test that you have, your test results may be reported as values. Your health care provider will compare your results to normal ranges that were established after testing a large group of people (reference ranges). Reference ranges may vary among labs and hospitals. For this test, common reference ranges that show absence of pregnancy are:  Quantitative hCG blood levels: less than 5 IU/L. Other results will be reported as either positive or negative. For this test, normal results (meaning the absence of pregnancy) are:  Negative for hCG in the urine test.  Negative for hCG in the qualitative blood test. What do the results mean? Urine and qualitative blood test  A negative result could mean: ? That you are not pregnant. ? That the test was done too early in your pregnancy to detect hCG in your blood or urine. If you still have other signs   of pregnancy, the test will be repeated.  A positive result means: ? That you are most likely pregnant. Your health care provider may confirm your pregnancy with an imaging study (ultrasound) of your uterus, if needed. Quantitative blood test Results of the quantitative hCG blood test will be interpreted as follows:  Less than 5 IU/L: You are most likely not pregnant.  Greater than 25 IU/L: You are most likely pregnant.  hCG levels that are higher than expected: ? You are pregnant with twins. ? You have abnormal growths in the uterus.  hCG levels that are  rising more slowly than expected: ? You have an ectopic pregnancy (also called a tubal pregnancy).  hCG levels that are falling: ? You may be having a miscarriage. Talk with your health care provider about what your results mean. Questions to ask your health care provider Ask your health care provider, or the department that is doing the test:  When will my results be ready?  How will I get my results?  What are my treatment options?  What other tests do I need?  What are my next steps? Summary  A human chorionic gonadotropin test is done to determine whether you are pregnant.  When you are pregnant, hCG can be detected in your blood or urine 7 to 8 days before your missed period. It continues to go up for the first 8-10 weeks of pregnancy.  Your hCG level can be measured with different types of tests. You may have a urine test, a qualitative blood test, or a quantitative blood test.  Talk with your health care provider about what your results mean. This information is not intended to replace advice given to you by your health care provider. Make sure you discuss any questions you have with your health care provider. Document Revised: 06/04/2017 Document Reviewed: 06/04/2017 Elsevier Patient Education  2020 [employer]  

## 2019-12-05 NOTE — MAU Provider Note (Signed)
History     CSN: [id number]  Arrival date and time: 12/05/19 2026   First Provider Initiated Contact with Patient 12/05/19 2104      Chief Complaint  Patient presents with  . Vaginal Bleeding   Bailey Rose is a 24 y.o. G3P0020 who her LMP on April 13th who receives care at [facility].  She presents today for Vaginal Bleeding.  She reports she was having some brown discharge around 1810.  She reports that she put a Q-tip in the vagina and it came out red with mucous.  She states she is currently treating a yeast infection with Monistat x 2 days.  She reports that the treatment has been helping.  She denies sexual activity in the past 3 days. She denies urinary symptoms as well as constipation or diarrhea.  Patient also denies vomiting, but reports some nausea. Patient also reports some left sided hip pain that started today.  She states she has not taken anything for the pain.    OB History    Gravida  3   Para  0   Term  0   Preterm  0   AB  2   Living  0     SAB  2   TAB  0   Ectopic  0   Multiple  0   Live Births  0           Past Medical History:  Diagnosis Date  . Asthma   . Migraines   . Polycystic ovarian syndrome   . Sickle cell trait [facility])     Past Surgical History:  Procedure Laterality Date  . TONSILLECTOMY    . WISDOM TOOTH EXTRACTION  2017    Family History  Problem Relation Age of Onset  . Sickle cell anemia Mother   . Cancer Maternal Grandmother        lung  . Migraines Maternal Grandmother   . Lupus Maternal Grandmother     Social History   Tobacco Use  . Smoking status: Light Tobacco Smoker    Types: Cigarettes  . Smokeless tobacco: Never Used  Substance Use Topics  . Alcohol use: No    Alcohol/week: 0.0 standard drinks  . Drug use: Not Currently    Types: Marijuana    Allergies: No Known Allergies  Medications Prior to Admission  Medication Sig Dispense Refill Last Dose  . acetaminophen-codeine (TYLENOL #3) 300-30  MG tablet Take 2 tablets by mouth every 6 (six) hours as needed for moderate pain. 30 tablet 0   . albuterol (VENTOLIN HFA) 108 (90 Base) MCG/ACT inhaler Inhale 1-2 puffs into the lungs every 6 (six) hours as needed for wheezing or shortness of breath. 1 g 1   . ibuprofen (ADVIL) 800 MG tablet Take 1 tablet (800 mg total) by mouth every 8 (eight) hours as needed. 30 tablet 0     Review of Systems  Gastrointestinal: Positive for nausea. Negative for abdominal pain, constipation, diarrhea and vomiting.  Genitourinary: Positive for vaginal bleeding and vaginal discharge. Negative for difficulty urinating and dysuria.  Musculoskeletal: Negative for back pain.   Physical Exam   Blood pressure 112/68, pulse 90, temperature 99.1 F (37.3 C), temperature source Oral, resp. rate 16, height 5\' 1"  (1.549 m), weight 78.9 kg, last menstrual period 10/28/2019, SpO2 98 %.  Physical Exam  Constitutional: She is oriented to person, place, and time. She appears well-developed and well-nourished.  Obese  HENT:  Head: Normocephalic and atraumatic.  Eyes: Conjunctivae  are normal.  Cardiovascular: Normal rate and normal heart sounds.  Respiratory: Effort normal and breath sounds normal. No respiratory distress.  GI: Soft. There is abdominal tenderness (Mild with deep palpation in LL/UQ).  Genitourinary: Cervix exhibits no motion tenderness, no discharge and no friability.    No vaginal discharge or bleeding.  No bleeding in the vagina.    Genitourinary Comments: Speculum Exam: -Normal External Genitalia: Non tender, no apparent discharge at introitus.  -Vaginal Vault: Pink mucosa with good rugae. Scant amt curdy white discharge in posterior fornix.  Removed with faux swab to reveal some light pink discharge -wet prep collected -Cervix:Pink, no lesions, or polyps. Small Nabothian cyst noted at ~9'oclock. Cervix appears closed. No active bleeding from os-GC/CT collected -Bimanual Exam:  Uterine size difficult  to identify s/t abdominal panus.     Musculoskeletal:        General: No edema. Normal range of motion.     Cervical back: Normal range of motion.  Neurological: She is alert and oriented to person, place, and time.  Skin: Skin is warm and dry.  Psychiatric: She has a normal mood and affect. Her behavior is normal.    MAU Course  Procedures Results for orders placed or performed during the hospital encounter of 12/05/19 (from the past 24 hour(s))  Urinalysis, Routine w reflex microscopic     Status: Abnormal   Collection Time: 12/05/19  8:54 PM  Result Value Ref Range   Color, Urine YELLOW YELLOW   APPearance CLOUDY (A) CLEAR   Specific Gravity, Urine 1.014 1.005 - 1.030   pH 6.0 5.0 - 8.0   Glucose, UA NEGATIVE NEGATIVE mg/dL   Hgb urine dipstick LARGE (A) NEGATIVE   Bilirubin Urine NEGATIVE NEGATIVE   Ketones, ur NEGATIVE NEGATIVE mg/dL   Protein, ur NEGATIVE NEGATIVE mg/dL   Nitrite NEGATIVE NEGATIVE   Leukocytes,Ua MODERATE (A) NEGATIVE   RBC / HPF 11-20 0 - 5 RBC/hpf   WBC, UA 11-20 0 - 5 WBC/hpf   Bacteria, UA FEW (A) NONE SEEN   Squamous Epithelial / LPF 11-20 0 - 5   Mucus PRESENT   CBC     Status: Abnormal   Collection Time: 12/05/19  9:27 PM  Result Value Ref Range   WBC 11.1 (H) 4.0 - 10.5 K/uL   RBC 5.61 (H) 3.87 - 5.11 MIL/uL   Hemoglobin 13.4 12.0 - 15.0 g/dL   HCT 41.8 36.0 - 46.0 %   MCV 74.5 (L) 80.0 - 100.0 fL   MCH 23.9 (L) 26.0 - 34.0 pg   MCHC 32.1 30.0 - 36.0 g/dL   RDW 14.6 11.5 - 15.5 %   Platelets 216 150 - 400 K/uL   nRBC 0.0 0.0 - 0.2 %  Wet prep, genital     Status: Abnormal   Collection Time: 12/05/19  9:45 PM   Specimen: Cervix  Result Value Ref Range   Yeast Wet Prep HPF POC NONE SEEN NONE SEEN   Trich, Wet Prep NONE SEEN NONE SEEN   Clue Cells Wet Prep HPF POC NONE SEEN NONE SEEN   WBC, Wet Prep HPF POC FEW (A) NONE SEEN   Sperm NONE SEEN     MDM Pelvic Exam Labs: UA, UC, GC/CT, Wet Prep Assessment and Plan  24 year old  G3P0020 Vaginal Bleeding  -POC Reviewed. -Exam completed and findings reviewed. -Patient offered and declined pain medication.  -Patient informed that hCG labs would return in ~1.5 to 2 hours.  Given option to  wait for results or for provider to call in AM. -Patient opts for discharge.  -Discussed potential POC to include returning for repeat hCG on Sunday. -Informed that if cultures return positive will notify and send treatment via mychart. -Discussed usage of heating pad and/or tylenol for intermittent hip pain and if it worsens to return to MAU if it becomes of concern.  -Patient without further questions or concerns. -Discharged to home in stable condition.  Cherre Robins, MSN, CNM 12/05/2019, 9:04 PM

## 2019-12-05 NOTE — MAU Note (Addendum)
Went to dr office last week, was late on period and took home pregnancy test, line was faint.  Drew blood work and levels at 8 and gave u/s appt for June 2nd.  Last night and today,  breasts sharp pains come and go.  Thinks has a yeast infection itching, discharge brown -monistat x 2 days, is helping.  20 min ago saw brown on underwear and stuck q-tip inside vagina and saw bright red blood on q-tip. Dull abd pain since this morning but had had cramps x 1.5 weeks.

## 2019-12-06 ENCOUNTER — Telehealth: Payer: Self-pay

## 2019-12-06 NOTE — Telephone Encounter (Signed)
Bailey Rose 1996/06/20 [id number]  Patient called into MAU with complaint of vaginal bleeding and provider able to speak with her about concern as well as hCG results.  Patient verified her identity via birth date and last 4 of her SSN.  Patient agreeable to results via phone and was informed of hCG at 1502.  Informed that plan was to come in and repeat hCG on Sunday in MAU.  Patient verbalizes understanding.  Reviewed vaginal bleeding with patient reports is light pink spotting that she notices after placement of monistat for yeast infection.  Patient informed that some vaginal spotting is common in setting of vaginal infections.  Instructed to complete her treatment and monitor symptoms.  Further informed that wet prep returned negative for yeast, but this is likely due to treatment in process.  Patient verbalized understanding and without further questions or concerns.   Cherre Robins MSN, CNM Advanced Practice Provider, [facility]  **This visit was completed, in its entirety, via telehealth communications.  I personally spent >/=2 minutes on the phone providing recommendations, education, and guidance.**

## 2019-12-07 ENCOUNTER — Encounter (HOSPITAL_COMMUNITY): Payer: Self-pay | Admitting: Obstetrics and Gynecology

## 2019-12-07 ENCOUNTER — Inpatient Hospital Stay (HOSPITAL_COMMUNITY): Payer: Medicaid Other

## 2019-12-07 ENCOUNTER — Inpatient Hospital Stay (HOSPITAL_COMMUNITY)
Admission: AD | Admit: 2019-12-07 | Discharge: 2019-12-07 | Disposition: A | Discharge status: Home / Self Care | Payer: Medicaid Other | Attending: Obstetrics and Gynecology | Admitting: Obstetrics and Gynecology

## 2019-12-07 ENCOUNTER — Inpatient Hospital Stay (EMERGENCY_DEPARTMENT_HOSPITAL)
Admission: AD | Admit: 2019-12-07 | Discharge: 2019-12-08 | Disposition: A | Discharge status: Home / Self Care | Payer: Medicaid Other | Source: Home / Self Care | Attending: Obstetrics and Gynecology | Admitting: Obstetrics and Gynecology

## 2019-12-07 ENCOUNTER — Other Ambulatory Visit: Payer: Self-pay

## 2019-12-07 DIAGNOSIS — O208 Other hemorrhage in early pregnancy: Secondary | ICD-10-CM | POA: Diagnosis not present

## 2019-12-07 DIAGNOSIS — O418X10 Other specified disorders of amniotic fluid and membranes, first trimester, not applicable or unspecified: Secondary | ICD-10-CM | POA: Diagnosis not present

## 2019-12-07 DIAGNOSIS — O468X1 Other antepartum hemorrhage, first trimester: Secondary | ICD-10-CM

## 2019-12-07 DIAGNOSIS — Z6791 Unspecified blood type, Rh negative: Secondary | ICD-10-CM

## 2019-12-07 DIAGNOSIS — O99011 Anemia complicating pregnancy, first trimester: Secondary | ICD-10-CM | POA: Insufficient documentation

## 2019-12-07 DIAGNOSIS — E349 Endocrine disorder, unspecified: Secondary | ICD-10-CM

## 2019-12-07 DIAGNOSIS — O99511 Diseases of the respiratory system complicating pregnancy, first trimester: Secondary | ICD-10-CM | POA: Diagnosis not present

## 2019-12-07 DIAGNOSIS — O4690 Antepartum hemorrhage, unspecified, unspecified trimester: Secondary | ICD-10-CM

## 2019-12-07 DIAGNOSIS — F17210 Nicotine dependence, cigarettes, uncomplicated: Secondary | ICD-10-CM | POA: Insufficient documentation

## 2019-12-07 DIAGNOSIS — O0281 Inappropriate change in quantitative human chorionic gonadotropin (hCG) in early pregnancy: Secondary | ICD-10-CM

## 2019-12-07 DIAGNOSIS — O99331 Smoking (tobacco) complicating pregnancy, first trimester: Secondary | ICD-10-CM | POA: Diagnosis not present

## 2019-12-07 DIAGNOSIS — D573 Sickle-cell trait: Secondary | ICD-10-CM | POA: Insufficient documentation

## 2019-12-07 DIAGNOSIS — O360110 Maternal care for anti-D [Rh] antibodies, first trimester, not applicable or unspecified: Secondary | ICD-10-CM

## 2019-12-07 DIAGNOSIS — O9229 Other disorders of breast associated with pregnancy and the puerperium: Secondary | ICD-10-CM | POA: Insufficient documentation

## 2019-12-07 DIAGNOSIS — O209 Hemorrhage in early pregnancy, unspecified: Secondary | ICD-10-CM | POA: Insufficient documentation

## 2019-12-07 DIAGNOSIS — Z3A01 Less than 8 weeks gestation of pregnancy: Secondary | ICD-10-CM | POA: Diagnosis not present

## 2019-12-07 DIAGNOSIS — Z3490 Encounter for supervision of normal pregnancy, unspecified, unspecified trimester: Secondary | ICD-10-CM

## 2019-12-07 DIAGNOSIS — J45909 Unspecified asthma, uncomplicated: Secondary | ICD-10-CM | POA: Diagnosis not present

## 2019-12-07 LAB — CULTURE, OB URINE

## 2019-12-07 LAB — CBC
HCT: 40.3 % (ref 36.0–46.0)
Hemoglobin: 13.1 g/dL (ref 12.0–15.0)
MCH: 23.8 pg — ABNORMAL LOW (ref 26.0–34.0)
MCHC: 32.5 g/dL (ref 30.0–36.0)
MCV: 73.3 fL — ABNORMAL LOW (ref 80.0–100.0)
Platelets: 229 10*3/uL (ref 150–400)
RBC: 5.5 MIL/uL — ABNORMAL HIGH (ref 3.87–5.11)
RDW: 14.6 % (ref 11.5–15.5)
WBC: 10.5 10*3/uL (ref 4.0–10.5)
nRBC: 0 % (ref 0.0–0.2)

## 2019-12-07 LAB — COMPREHENSIVE METABOLIC PANEL
ALT: 12 U/L (ref 0–44)
AST: 13 U/L — ABNORMAL LOW (ref 15–41)
Albumin: 3.7 g/dL (ref 3.5–5.0)
Alkaline Phosphatase: 54 U/L (ref 38–126)
Anion gap: 8 (ref 5–15)
BUN: 14 mg/dL (ref 6–20)
CO2: 25 mmol/L (ref 22–32)
Calcium: 9.5 mg/dL (ref 8.9–10.3)
Chloride: 107 mmol/L (ref 98–111)
Creatinine, Ser: 0.69 mg/dL (ref 0.44–1.00)
GFR calc Af Amer: >60 mL/min (ref >60)
GFR calc non Af Amer: >60 mL/min (ref >60)
Glucose, Bld: 107 mg/dL — ABNORMAL HIGH (ref 70–99)
Potassium: 4.2 mmol/L (ref 3.5–5.1)
Sodium: 140 mmol/L (ref 135–145)
Total Bilirubin: 0.6 mg/dL (ref 0.3–1.2)
Total Protein: 6.6 g/dL (ref 6.5–8.1)

## 2019-12-07 LAB — HCG, QUANTITATIVE, PREGNANCY: hCG, Beta Chain, Quant, S: 3399 m[IU]/mL — ABNORMAL HIGH (ref <5)

## 2019-12-07 MED ORDER — RHO D IMMUNE GLOBULIN 1500 UNIT/2ML IJ SOSY
300.0000 ug | PREFILLED_SYRINGE | Freq: Once | INTRAMUSCULAR | Status: AC
  Administered 2019-12-08: 300 ug via INTRAMUSCULAR
  Filled 2019-12-07: qty 2

## 2019-12-07 NOTE — Discharge Instructions (Signed)

## 2019-12-07 NOTE — MAU Provider Note (Signed)
History   Chief Complaint:  Vaginal Bleeding and Breast Pain   Bailey Rose is  24 y.o. L3Y1017 Patient's last menstrual period was 10/28/2019.[name] Patient is here for follow up of quantitative HCG and ongoing surveillance of pregnancy status. She is [redacted]w[redacted]d weeks gestation  by LMP.    Since her last visit, the patient is without new complaint. The patient reports bleeding as  none now.  She denies any pain.  General ROS:  negative  Her previous Quantitative HCG values are:  Results for Bailey, Rose (MRN [id number]) as of 12/07/2019 14:07  Ref. Range 12/05/2019 21:27  HCG, Beta Chain, Quant, S Latest Ref Range: <5 mIU/mL 1,502 (H)   Physical Exam   Blood pressure 111/68, pulse 84, resp. rate 18, last menstrual period 10/28/2019, SpO2 99 %.  Focused Gynecological Exam: examination not indicated  Labs: Results for orders placed or performed during the hospital encounter of 12/07/19 (from the past 24 hour(s))  hCG, quantitative, pregnancy   Collection Time: 12/07/19 12:26 PM  Result Value Ref Range   hCG, Beta Chain, Quant, S 3,399 (H) <5 mIU/mL   Assessment:   1. Elevated serum hCG   2. [redacted] weeks gestation of pregnancy     Normal rise in HCG over 48hrs. Patient has outpatient u/s scheduled 6/2.  Plan: -Discharge home in stable condition -First trimester precautions discussed -Patient advised to follow-up with [facility] for ultrasound -Patient may return to MAU as needed or if her condition were to change or worsen  Rolm Bookbinder, CNM 12/07/2019, 1:48 PM

## 2019-12-07 NOTE — MAU Provider Note (Signed)
History     CSN: [id number]  Arrival date and time: 12/07/19 2108   First Provider Initiated Contact with Patient 12/07/19 2234      Chief Complaint  Patient presents with  . Vaginal Bleeding   [name] is a 24 y.o. G3P0020 at [redacted]w[redacted]d who presents to MAU for vaginal bleeding which began Friday 12/05/2019. Patient reports at that time she was having cramping along with bleeding, but the bleeding was very light. Patient reports the bleeding is like a period and has just started heavily about an hour ago. Patient reports she was here earlier today in MAU for a pregnancy hormone level blood draw that patient reports doubled since Friday.  Passing blood clots? Yes, small Blood soaking clothes? no Lightheaded/dizzy? no Significant pelvic pain or cramping? Menstrual-like cramping Passed any tissue? unsure Hx ectopic pregnancy? no  Current pregnancy problems? Pt has not yet been seen Blood Type? O NEGATIVE Allergies? NKDA Current medications? NKDA Current [facility] & next appt? [facility], pt does not have appt scheduled at this time  Pt denies vaginal discharge/odor/itching. Pt denies N/V, abdominal pain, constipation, diarrhea, or urinary problems. Pt denies fever, chills, fatigue, sweating or changes in appetite. Pt denies SOB or chest pain. Pt denies dizziness, HA, light-headedness, weakness.   OB History    Gravida  3   Para  0   Term  0   Preterm  0   AB  2   Living  0     SAB  2   TAB  0   Ectopic  0   Multiple  0   Live Births  0           Past Medical History:  Diagnosis Date  . Asthma   . Migraines   . Polycystic ovarian syndrome   . Sickle cell trait [facility])     Past Surgical History:  Procedure Laterality Date  . TONSILLECTOMY    . WISDOM TOOTH EXTRACTION  2017    Family History  Problem Relation Age of Onset  . Sickle cell anemia Mother   . Cancer Maternal Grandmother        lung  . Migraines Maternal Grandmother   . Lupus Maternal  Grandmother     Social History   Tobacco Use  . Smoking status: Light Tobacco Smoker    Types: Cigarettes  . Smokeless tobacco: Never Used  Substance Use Topics  . Alcohol use: No    Alcohol/week: 0.0 standard drinks  . Drug use: Not Currently    Types: Marijuana    Allergies: No Known Allergies  Medications Prior to Admission  Medication Sig Dispense Refill Last Dose  . albuterol (VENTOLIN HFA) 108 (90 Base) MCG/ACT inhaler Inhale 1-2 puffs into the lungs every 6 (six) hours as needed for wheezing or shortness of breath. 1 g 1 More than a month at Unknown time  . Prenatal Vit-Fe Fumarate-FA (PRENATAL MULTIVITAMIN) TABS tablet Take 1 tablet by mouth daily at 12 noon.       Review of Systems  Constitutional: Negative for chills, diaphoresis, fatigue and fever.  Eyes: Negative for visual disturbance.  Respiratory: Negative for shortness of breath.   Cardiovascular: Negative for chest pain.  Gastrointestinal: Negative for abdominal pain, constipation, diarrhea, nausea and vomiting.  Genitourinary: Positive for pelvic pain and vaginal discharge. Negative for dysuria, flank pain, frequency, urgency and vaginal bleeding.  Neurological: Negative for dizziness, weakness, light-headedness and headaches.   Physical Exam   Blood pressure 111/73, pulse 85,  resp. rate 18, weight 79.4 kg, last menstrual period 10/28/2019, SpO2 99 %.  Patient Vitals for the past 24 hrs:  BP Pulse Resp SpO2 Weight  12/07/19 2130 111/73 85 - - -  12/07/19 2122 - - - - 79.4 kg  12/07/19 2121 125/77 97 18 99 % -   Physical Exam  Constitutional: She is oriented to person, place, and time. She appears well-developed and well-nourished. No distress.  HENT:  Head: Normocephalic and atraumatic.  Respiratory: Effort normal.  GI: Soft.  Genitourinary: There is no rash, tenderness or lesion on the right labia. There is no rash, tenderness or lesion on the left labia. Uterus is not enlarged and not tender.  Cervix exhibits no motion tenderness, no discharge and no friability. Right adnexum displays no mass, no tenderness and no fullness. Left adnexum displays no mass, no tenderness and no fullness.    Vaginal bleeding (scant blood coating vaginal walls, no active bleeding on exam) present.     No vaginal discharge or tenderness.  There is bleeding (scant blood coating vaginal walls, no active bleeding on exam) in the vagina. No tenderness in the vagina.    Genitourinary Comments: CE: long/closed/posterior   Neurological: She is alert and oriented to person, place, and time.  Skin: Skin is warm and dry. She is not diaphoretic.  Psychiatric: She has a normal mood and affect. Her behavior is normal. Judgment and thought content normal.    Results for orders placed or performed during the hospital encounter of 12/07/19 (from the past 24 hour(s))  Rh IG workup (includes ABO/Rh)     Status: None (Preliminary result)   Collection Time: 12/07/19 10:46 PM  Result Value Ref Range   Gestational Age(Wks) 5.5    ABO/RH(D) O NEG    Antibody Screen NEG    Unit Number [id number]    Blood Component Type RHIG    Unit division 00    Status of Unit ISSUED    Transfusion Status      OK TO TRANSFUSE Performed at [facility] [address], [place], [place] [address]   CBC     Status: Abnormal   Collection Time: 12/07/19 10:46 PM  Result Value Ref Range   WBC 10.5 4.0 - 10.5 K/uL   RBC 5.50 (H) 3.87 - 5.11 MIL/uL   Hemoglobin 13.1 12.0 - 15.0 g/dL   HCT 40.3 36.0 - 46.0 %   MCV 73.3 (L) 80.0 - 100.0 fL   MCH 23.8 (L) 26.0 - 34.0 pg   MCHC 32.5 30.0 - 36.0 g/dL   RDW 14.6 11.5 - 15.5 %   Platelets 229 150 - 400 K/uL   nRBC 0.0 0.0 - 0.2 %  Comprehensive metabolic panel     Status: Abnormal   Collection Time: 12/07/19 10:46 PM  Result Value Ref Range   Sodium 140 135 - 145 mmol/L   Potassium 4.2 3.5 - 5.1 mmol/L   Chloride 107 98 - 111 mmol/L   CO2 25 22 - 32 mmol/L   Glucose, Bld 107 (H)  70 - 99 mg/dL   BUN 14 6 - 20 mg/dL   Creatinine, Ser 0.69 0.44 - 1.00 mg/dL   Calcium 9.5 8.9 - 10.3 mg/dL   Total Protein 6.6 6.5 - 8.1 g/dL   Albumin 3.7 3.5 - 5.0 g/dL   AST 13 (L) 15 - 41 U/L   ALT 12 0 - 44 U/L   Alkaline Phosphatase 54 38 - 126 U/L  Total Bilirubin 0.6 0.3 - 1.2 mg/dL   GFR calc non Af Amer >60 >60 mL/min   GFR calc Af Amer >60 >60 mL/min   Anion gap 8 5 - 15  hCG, quantitative, pregnancy     Status: Abnormal   Collection Time: 12/07/19 10:46 PM  Result Value Ref Range   hCG, Beta Chain, Quant, S 4,196 (H) <5 mIU/mL   US OB LESS THAN 14 WEEKS WITH OB TRANSVAGINAL  Result Date: 12/07/2019 CLINICAL DATA:  Initial evaluation for acute vaginal bleeding. Early pregnancy. EXAM: OBSTETRIC <14 WK [place] AND TRANSVAGINAL OB US TECHNIQUE: Both transabdominal and transvaginal ultrasound examinations were performed for complete evaluation of the gestation as well as the maternal uterus, adnexal regions, and pelvic cul-de-sac. Transvaginal technique was performed to assess early pregnancy. COMPARISON:  None. FINDINGS: Intrauterine gestational sac: Single. Yolk sac:  Present. Embryo:  Not visualized. Cardiac Activity: Negative. Heart Rate: N/A  bpm MSD: 4.2 mm   5 w   1 d Subchorionic hemorrhage: Small subchorionic hemorrhage measures 0.8 x 0.3 x 1.2 cm without associated mass effect. Maternal uterus/adnexae: Ovaries are normal in appearance bilaterally. Corpus luteal cyst noted on the left. Associated small volume free fluid within the pelvis. No adnexal mass. IMPRESSION: 1. Early intrauterine gestational sac with internal yolk sac, but no fetal pole or cardiac activity yet visualized. Recommend follow-up quantitative B-HCG levels and follow-up US in 14 days to confirm and assess viability. 2. Small subchorionic hemorrhage without associated mass effect. 3. Degenerating left ovarian corpus luteal cyst with associated small volume free fluid within the pelvis. 4. No other acute maternal  uterine or adnexal abnormality identified. Electronically Signed   By: Rise Mu M.D.   On: 12/07/2019 23:43   MAU Course  Procedures  MDM -r/o ectopic -CBC: no abnormalities requiring treatment -CMP: no abnormalities requiring treatment -[place]: +GS, +yolk sac, [redacted]w[redacted]d, small SCH -hCG: 4,196 -ABO: O NEGATIVE, RhoGAM given -WetPrep: from 12/05/2019 WNL -GC/CT collected 12/05/2019, results not yet available -pt discharged to home in stable condition  Orders Placed This Encounter  Procedures  . US OB LESS THAN 14 WEEKS WITH OB TRANSVAGINAL    Standing Status:   Standing    Number of Occurrences:   1    Order Specific Question:   Symptom/Reason for Exam    Answer:   Vaginal bleeding in pregnancy [[id number]]  . CBC    Standing Status:   Standing    Number of Occurrences:   1  . Comprehensive metabolic panel    Standing Status:   Standing    Number of Occurrences:   1  . hCG, quantitative, pregnancy    Standing Status:   Standing    Number of Occurrences:   1  . Rh IG workup (includes ABO/Rh)    Standing Status:   Standing    Number of Occurrences:   1    Order Specific Question:   Weeks of Gestation    Answer:   5.5  . Discharge patient    Order Specific Question:   Discharge disposition    Answer:   01-Home or Self Care [1]    Order Specific Question:   Discharge patient date    Answer:   12/08/2019   Meds ordered this encounter  Medications  . rho (d) immune globulin (RHIG/RHOPHYLAC) injection 300 mcg   Assessment and Plan   1. Subchorionic hemorrhage of placenta in first trimester, single or unspecified fetus   2. Vaginal bleeding in pregnancy  3. Intrauterine pregnancy   4. Rh negative state in antepartum period   5. [redacted] weeks gestation of pregnancy    Allergies as of 12/08/2019   No Known Allergies     Medication List    TAKE these medications   albuterol 108 (90 Base) MCG/ACT inhaler Commonly known as: VENTOLIN HFA Inhale 1-2 puffs into the lungs every  6 (six) hours as needed for wheezing or shortness of breath.   prenatal multivitamin Tabs tablet Take 1 tablet by mouth daily at 12 noon.      -will change viability scan to 14 days from today, email sent to reschedule, pt aware to expect phone call -pt to start prenatal care after viability scan -safe meds in pregnancy list given -return MAU precautions given -pt discharged to home in stable condition  Joni Reining E Nugent 12/08/2019, 12:48 AM

## 2019-12-07 NOTE — MAU Note (Signed)
Pt reports to MAU for VB and states she passed a clot at 820 pm. Pt reports cramping.

## 2019-12-08 ENCOUNTER — Telehealth: Payer: Self-pay | Admitting: Medical

## 2019-12-08 DIAGNOSIS — O2341 Unspecified infection of urinary tract in pregnancy, first trimester: Secondary | ICD-10-CM

## 2019-12-08 LAB — GC/CHLAMYDIA PROBE AMP (~~LOC~~) NOT AT ARMC
Chlamydia: NEGATIVE
Comment: NEGATIVE
Comment: NORMAL
Neisseria Gonorrhea: NEGATIVE

## 2019-12-08 LAB — HCG, QUANTITATIVE, PREGNANCY: hCG, Beta Chain, Quant, S: 4196 m[IU]/mL — ABNORMAL HIGH (ref <5)

## 2019-12-08 MED ORDER — AMOXICILLIN 500 MG PO CAPS: 500.0000 mg | ORAL_CAPSULE | Freq: Three times a day (TID) | ORAL | 0 refills | Status: DC

## 2019-12-08 NOTE — Discharge Instructions (Signed)
Subchorionic Hematoma  A subchorionic hematoma is a gathering of blood between the outer wall of the embryo (chorion) and the inner wall of the womb (uterus). This condition can cause vaginal bleeding. If they cause little or no vaginal bleeding, early small hematomas usually shrink on their own and do not affect your baby or pregnancy. When bleeding starts later in pregnancy, or if the hematoma is larger or occurs in older pregnant women, the condition may be more serious. Larger hematomas may get bigger, which increases the chances of miscarriage. This condition also increases the risk of:  Premature separation of the placenta from the uterus.  Premature (preterm) labor.  Stillbirth. What are the causes? The exact cause of this condition is not known. It occurs when blood is trapped between the placenta and the uterine wall because the placenta has separated from the original site of implantation. What increases the risk? You are more likely to develop this condition if:  You were treated with fertility medicines.  You conceived through in vitro fertilization (IVF). What are the signs or symptoms? Symptoms of this condition include:  Vaginal spotting or bleeding.  Contractions of the uterus. These cause abdominal pain. Sometimes you may have no symptoms and the bleeding may only be seen when ultrasound images are taken (transvaginal ultrasound). How is this diagnosed? This condition is diagnosed based on a physical exam. This includes a pelvic exam. You may also have other tests, including:  Blood tests.  Urine tests.  Ultrasound of the abdomen. How is this treated? Treatment for this condition can vary. Treatment may include:  Watchful waiting. You will be monitored closely for any changes in bleeding. During this stage: ? The hematoma may be reabsorbed by the body. ? The hematoma may separate the fluid-filled space containing the embryo (gestational sac) from the wall of the  womb (endometrium).  Medicines.  Activity restriction. This may be needed until the bleeding stops. Follow these instructions at home:  Stay on bed rest if told to do so by your health care provider.  Do not lift anything that is heavier than 10 lbs. (4.5 kg) or as told by your health care provider.  Do not use any products that contain nicotine or tobacco, such as cigarettes and e-cigarettes. If you need help quitting, ask your health care provider.  Track and write down the number of pads you use each day and how soaked (saturated) they are.  Do not use tampons.  Keep all follow-up visits as told by your health care provider. This is important. Your health care provider may ask you to have follow-up blood tests or ultrasound tests or both. Contact a health care provider if:  You have any vaginal bleeding.  You have a fever. Get help right away if:  You have severe cramps in your stomach, back, abdomen, or pelvis.  You pass large clots or tissue. Save any tissue for your health care provider to look at.  You have more vaginal bleeding, and you faint or become lightheaded or weak. Summary  A subchorionic hematoma is a gathering of blood between the outer wall of the placenta and the uterus.  This condition can cause vaginal bleeding.  Sometimes you may have no symptoms and the bleeding may only be seen when ultrasound images are taken.  Treatment may include watchful waiting, medicines, or activity restriction. This information is not intended to replace advice given to you by your health care provider. Make sure you discuss any questions you   have with your health care provider. Document Revised: 06/15/2017 Document Reviewed: 08/29/2016 Elsevier Patient Education  2020 [employer].                        Safe Medications in Pregnancy    Acne: Benzoyl Peroxide Salicylic Acid  Backache/Headache: Tylenol: 2 regular strength every 4 hours OR              2 Extra  strength every 6 hours  Colds/Coughs/Allergies: Benadryl (alcohol free) 25 mg every 6 hours as needed Breath right strips Claritin Cepacol throat lozenges Chloraseptic throat spray Cold-Eeze- up to three times per day Cough drops, alcohol free Flonase (by prescription only) Guaifenesin Mucinex Robitussin DM (plain only, alcohol free) Saline nasal spray/drops Sudafed (pseudoephedrine) & Actifed ** use only after [redacted] weeks gestation and if you do not have high blood pressure Tylenol Vicks Vaporub Zinc lozenges Zyrtec   Constipation: Colace Ducolax suppositories Fleet enema Glycerin suppositories Metamucil Milk of magnesia Miralax Senokot Smooth move tea  Diarrhea: Kaopectate Imodium A-D  *NO pepto Bismol  Hemorrhoids: Anusol Anusol HC Preparation H Tucks  Indigestion: Tums Maalox Mylanta Zantac  Pepcid  Insomnia: Benadryl (alcohol free) 25mg  every 6 hours as needed Tylenol PM Unisom, no Gelcaps  Leg Cramps: Tums MagGel  Nausea/Vomiting:  Bonine Dramamine Emetrol Ginger extract Sea bands Meclizine  Nausea medication to take during pregnancy:  Unisom (doxylamine succinate 25 mg tablets) Take one tablet daily at bedtime. If symptoms are not adequately controlled, the dose can be increased to a maximum recommended dose of two tablets daily (1/2 tablet in the morning, 1/2 tablet mid-afternoon and one at bedtime). Vitamin B6 100mg  tablets. Take one tablet twice a day (up to 200 mg per day).  Skin Rashes: Aveeno products Benadryl cream or 25mg  every 6 hours as needed Calamine Lotion 1% cortisone cream  Yeast infection: Gyne-lotrimin 7 Monistat 7   **If taking multiple medications, please check labels to avoid duplicating the same active ingredients **take medication as directed on the label ** Do not exceed 4000 mg of tylenol in 24 hours **Do not take medications that contain aspirin or ibuprofen         Abdominal Pain During  Pregnancy  Abdominal pain is common during pregnancy, and has many possible causes. Some causes are more serious than others, and sometimes the cause is not known. Abdominal pain can be a sign that labor is starting. It can also be caused by normal growth and stretching of muscles and ligaments during pregnancy. Always tell your health care provider if you have any abdominal pain. Follow these instructions at home:  Do not have sex or put anything in your vagina until your pain goes away completely.  Get plenty of rest until your pain improves.  Drink enough fluid to keep your urine pale yellow.  Take over-the-counter and prescription medicines only as told by your health care provider.  Keep all follow-up visits as told by your health care provider. This is important. Contact a health care provider if:  Your pain continues or gets worse after resting.  You have lower abdominal pain that: ? Comes and goes at regular intervals. ? Spreads to your back. ? Is similar to menstrual cramps.  You have pain or burning when you urinate. Get help right away if:  You have a fever or chills.  You have vaginal bleeding.  You are leaking fluid from your vagina.  You are passing tissue from your vagina.  You have vomiting or diarrhea that lasts for more than 24 hours.  Your baby is moving less than usual.  You feel very weak or faint.  You have shortness of breath.  You develop severe pain in your upper abdomen. Summary  Abdominal pain is common during pregnancy, and has many possible causes.  If you experience abdominal pain during pregnancy, tell your health care provider right away.  Follow your health care provider's home care instructions and keep all follow-up visits as directed. This information is not intended to replace advice given to you by your health care provider. Make sure you discuss any questions you have with your health care provider. Document Revised: 10/21/2018  Document Reviewed: 10/05/2016 Elsevier Patient Education  [place] of Pregnancy The first trimester of pregnancy is from week 1 until the end of week 13 (months 1 through 3). A week after a sperm fertilizes an egg, the egg will implant on the wall of the uterus. This embryo will begin to develop into a baby. Genes from you and your partner will form the baby. The female genes will determine whether the baby will be a boy or a girl. At 6-8 weeks, the eyes and face will be formed, and the heartbeat can be seen on ultrasound. At the end of 12 weeks, all the baby's organs will be formed. Now that you are pregnant, you will want to do everything you can to have a healthy baby. Two of the most important things are to get good prenatal care and to follow your health care provider's instructions. Prenatal care is all the medical care you receive before the baby's birth. This care will help prevent, find, and treat any problems during the pregnancy and childbirth. Body changes during your first trimester Your body goes through many changes during pregnancy. The changes vary from woman to woman.  You may gain or lose a couple of pounds at first.  You may feel sick to your stomach (nauseous) and you may throw up (vomit). If the vomiting is uncontrollable, call your health care provider.  You may tire easily.  You may develop headaches that can be relieved by medicines. All medicines should be approved by your health care provider.  You may urinate more often. Painful urination may mean you have a bladder infection.  You may develop heartburn as a result of your pregnancy.  You may develop constipation because certain hormones are causing the muscles that push stool through your intestines to slow down.  You may develop hemorrhoids or swollen veins (varicose veins).  Your breasts may begin to grow larger and become tender. Your nipples may stick out more, and the  tissue that surrounds them (areola) may become darker.  Your gums may bleed and may be sensitive to brushing and flossing.  Dark spots or blotches (chloasma, mask of pregnancy) may develop on your face. This will likely fade after the baby is born.  Your menstrual periods will stop.  You may have a loss of appetite.  You may develop cravings for certain kinds of food.  You may have changes in your emotions from day to day, such as being excited to be pregnant or being concerned that something may go wrong with the pregnancy and baby.  You may have more vivid and strange dreams.  You may have changes in your hair. These can include thickening of your hair, rapid growth, and changes in texture. Some  women also have hair loss during or after pregnancy, or hair that feels dry or thin. Your hair will most likely return to normal after your baby is born. What to expect at prenatal visits During a routine prenatal visit:  You will be weighed to make sure you and the baby are growing normally.  Your blood pressure will be taken.  Your abdomen will be measured to track your baby's growth.  The fetal heartbeat will be listened to between weeks 10 and 14 of your pregnancy.  Test results from any previous visits will be discussed. Your health care provider may ask you:  How you are feeling.  If you are feeling the baby move.  If you have had any abnormal symptoms, such as leaking fluid, bleeding, severe headaches, or abdominal cramping.  If you are using any tobacco products, including cigarettes, chewing tobacco, and electronic cigarettes.  If you have any questions. Other tests that may be performed during your first trimester include:  Blood tests to find your blood type and to check for the presence of any previous infections. The tests will also be used to check for low iron levels (anemia) and protein on red blood cells (Rh antibodies). Depending on your risk factors, or if you  previously had diabetes during pregnancy, you may have tests to check for high blood sugar that affects pregnant women (gestational diabetes).  Urine tests to check for infections, diabetes, or protein in the urine.  An ultrasound to confirm the proper growth and development of the baby.  Fetal screens for spinal cord problems (spina bifida) and Down syndrome.  HIV (human immunodeficiency virus) testing. Routine prenatal testing includes screening for HIV, unless you choose not to have this test.  You may need other tests to make sure you and the baby are doing well. Follow these instructions at home: Medicines  Follow your health care provider's instructions regarding medicine use. Specific medicines may be either safe or unsafe to take during pregnancy.  Take a prenatal vitamin that contains at least 600 micrograms (mcg) of folic acid.  If you develop constipation, try taking a stool softener if your health care provider approves. Eating and drinking   Eat a balanced diet that includes fresh fruits and vegetables, whole grains, good sources of protein such as meat, eggs, or tofu, and low-fat dairy. Your health care provider will help you determine the amount of weight gain that is right for you.  Avoid raw meat and uncooked cheese. These carry germs that can cause birth defects in the baby.  Eating four or five small meals rather than three large meals a day may help relieve nausea and vomiting. If you start to feel nauseous, eating a few soda crackers can be helpful. Drinking liquids between meals, instead of during meals, also seems to help ease nausea and vomiting.  Limit foods that are high in fat and processed sugars, such as fried and sweet foods.  To prevent constipation: ? Eat foods that are high in fiber, such as fresh fruits and vegetables, whole grains, and beans. ? Drink enough fluid to keep your urine clear or pale yellow. Activity  Exercise only as directed by your  health care provider. Most women can continue their usual exercise routine during pregnancy. Try to exercise for 30 minutes at least 5 days a week. Exercising will help you: ? Control your weight. ? Stay in shape. ? Be prepared for labor and delivery.  Experiencing pain or cramping in the lower  abdomen or lower back is a good sign that you should stop exercising. Check with your health care provider before continuing with normal exercises.  Try to avoid standing for long periods of time. Move your legs often if you must stand in one place for a long time.  Avoid heavy lifting.  Wear low-heeled shoes and practice good posture.  You may continue to have sex unless your health care provider tells you not to. Relieving pain and discomfort  Wear a good support bra to relieve breast tenderness.  Take warm sitz baths to soothe any pain or discomfort caused by hemorrhoids. Use hemorrhoid cream if your health care provider approves.  Rest with your legs elevated if you have leg cramps or low back pain.  If you develop varicose veins in your legs, wear support hose. Elevate your feet for 15 minutes, 3-4 times a day. Limit salt in your diet. Prenatal care  Schedule your prenatal visits by the twelfth week of pregnancy. They are usually scheduled monthly at first, then more often in the last 2 months before delivery.  Write down your questions. Take them to your prenatal visits.  Keep all your prenatal visits as told by your health care provider. This is important. Safety  Wear your seat belt at all times when driving.  Make a list of emergency phone numbers, including numbers for family, friends, the hospital, and police and fire departments. General instructions  Ask your health care provider for a referral to a local prenatal education class. Begin classes no later than the beginning of month 6 of your pregnancy.  Ask for help if you have counseling or nutritional needs during  pregnancy. Your health care provider can offer advice or refer you to specialists for help with various needs.  Do not use hot tubs, steam rooms, or saunas.  Do not douche or use tampons or scented sanitary pads.  Do not cross your legs for long periods of time.  Avoid cat litter boxes and soil used by cats. These carry germs that can cause birth defects in the baby and possibly loss of the fetus by miscarriage or stillbirth.  Avoid all smoking, herbs, alcohol, and medicines not prescribed by your health care provider. Chemicals in these products affect the formation and growth of the baby.  Do not use any products that contain nicotine or tobacco, such as cigarettes and e-cigarettes. If you need help quitting, ask your health care provider. You may receive counseling support and other resources to help you quit.  Schedule a dentist appointment. At home, brush your teeth with a soft toothbrush and be gentle when you floss. Contact a health care provider if:  You have dizziness.  You have mild pelvic cramps, pelvic pressure, or nagging pain in the abdominal area.  You have persistent nausea, vomiting, or diarrhea.  You have a bad smelling vaginal discharge.  You have pain when you urinate.  You notice increased swelling in your face, hands, legs, or ankles.  You are exposed to fifth disease or chickenpox.  You are exposed to [place] measles (rubella) and have never had it. Get help right away if:  You have a fever.  You are leaking fluid from your vagina.  You have spotting or bleeding from your vagina.  You have severe abdominal cramping or pain.  You have rapid weight gain or loss.  You vomit blood or material that looks like coffee grounds.  You develop a severe headache.  You have shortness of  breath.  You have any kind of trauma, such as from a fall or a car accident. Summary  The first trimester of pregnancy is from week 1 until the end of week 13 (months 1  through 3).  Your body goes through many changes during pregnancy. The changes vary from woman to woman.  You will have routine prenatal visits. During those visits, your health care provider will examine you, discuss any test results you may have, and talk with you about how you are feeling. This information is not intended to replace advice given to you by your health care provider. Make sure you discuss any questions you have with your health care provider. Document Revised: 06/15/2017 Document Reviewed: 06/14/2016 Elsevier Patient Education  2020 [employer].

## 2019-12-08 NOTE — Telephone Encounter (Signed)
LM for patient to return call to [facility]. Rx for Amoxicillin sent in for +GBS UTI.   Vonzella Nipple, PA-C 12/08/2019 1:19 PM

## 2019-12-09 ENCOUNTER — Telehealth: Payer: Self-pay | Admitting: Medical

## 2019-12-09 ENCOUNTER — Telehealth (INDEPENDENT_AMBULATORY_CARE_PROVIDER_SITE_OTHER): Payer: Medicaid Other | Admitting: Lactation Services

## 2019-12-09 DIAGNOSIS — O209 Hemorrhage in early pregnancy, unspecified: Secondary | ICD-10-CM

## 2019-12-09 LAB — RH IG WORKUP (INCLUDES ABO/RH)
ABO/RH(D): O NEG
Antibody Screen: NEGATIVE
Gestational Age(Wks): 5.5
Unit division: 0

## 2019-12-09 NOTE — Telephone Encounter (Signed)
Patient called in with concerns. Patient was informed that she has a sub chorianic hemorrhage. Patient reports that her job wants her to take the week off and needs a doctors note.   She is still having bleeding and cramping. She reports she is not filling a pad, she is changing her pad 1-2 times a day.   Work letter sent via My chart.

## 2019-12-09 NOTE — Telephone Encounter (Signed)
Patient called to say she needed a note to be out of work. Requesting a clinical staff call her back ASAP.

## 2019-12-10 ENCOUNTER — Encounter (HOSPITAL_COMMUNITY): Payer: Self-pay

## 2019-12-10 DIAGNOSIS — B951 Streptococcus, group B, as the cause of diseases classified elsewhere: Secondary | ICD-10-CM | POA: Insufficient documentation

## 2019-12-12 ENCOUNTER — Ambulatory Visit: Payer: Medicaid Other | Admitting: Clinical

## 2019-12-12 ENCOUNTER — Other Ambulatory Visit: Payer: Self-pay

## 2019-12-12 DIAGNOSIS — Z91199 Patient's noncompliance with other medical treatment and regimen due to unspecified reason: Secondary | ICD-10-CM

## 2019-12-14 ENCOUNTER — Inpatient Hospital Stay (HOSPITAL_COMMUNITY)
Admission: AD | Admit: 2019-12-14 | Discharge: 2019-12-14 | Disposition: A | Discharge status: Home / Self Care | Payer: Medicaid Other | Attending: Obstetrics & Gynecology | Admitting: Obstetrics & Gynecology

## 2019-12-14 ENCOUNTER — Inpatient Hospital Stay (HOSPITAL_COMMUNITY): Payer: Medicaid Other

## 2019-12-14 ENCOUNTER — Encounter (HOSPITAL_COMMUNITY): Payer: Self-pay | Admitting: Obstetrics & Gynecology

## 2019-12-14 ENCOUNTER — Other Ambulatory Visit: Payer: Self-pay

## 2019-12-14 DIAGNOSIS — Z79899 Other long term (current) drug therapy: Secondary | ICD-10-CM | POA: Insufficient documentation

## 2019-12-14 DIAGNOSIS — F17210 Nicotine dependence, cigarettes, uncomplicated: Secondary | ICD-10-CM | POA: Diagnosis not present

## 2019-12-14 DIAGNOSIS — E282 Polycystic ovarian syndrome: Secondary | ICD-10-CM | POA: Insufficient documentation

## 2019-12-14 DIAGNOSIS — O208 Other hemorrhage in early pregnancy: Secondary | ICD-10-CM

## 2019-12-14 DIAGNOSIS — Z6791 Unspecified blood type, Rh negative: Secondary | ICD-10-CM

## 2019-12-14 DIAGNOSIS — J45909 Unspecified asthma, uncomplicated: Secondary | ICD-10-CM | POA: Diagnosis not present

## 2019-12-14 DIAGNOSIS — O99331 Smoking (tobacco) complicating pregnancy, first trimester: Secondary | ICD-10-CM | POA: Insufficient documentation

## 2019-12-14 DIAGNOSIS — Z3A01 Less than 8 weeks gestation of pregnancy: Secondary | ICD-10-CM | POA: Diagnosis not present

## 2019-12-14 DIAGNOSIS — D573 Sickle-cell trait: Secondary | ICD-10-CM | POA: Diagnosis not present

## 2019-12-14 DIAGNOSIS — O99511 Diseases of the respiratory system complicating pregnancy, first trimester: Secondary | ICD-10-CM | POA: Insufficient documentation

## 2019-12-14 DIAGNOSIS — Z3490 Encounter for supervision of normal pregnancy, unspecified, unspecified trimester: Secondary | ICD-10-CM

## 2019-12-14 DIAGNOSIS — O418X10 Other specified disorders of amniotic fluid and membranes, first trimester, not applicable or unspecified: Secondary | ICD-10-CM

## 2019-12-14 DIAGNOSIS — O4690 Antepartum hemorrhage, unspecified, unspecified trimester: Secondary | ICD-10-CM

## 2019-12-14 LAB — URINALYSIS, ROUTINE W REFLEX MICROSCOPIC
Bilirubin Urine: NEGATIVE
Glucose, UA: NEGATIVE mg/dL
Ketones, ur: NEGATIVE mg/dL
Leukocytes,Ua: NEGATIVE
Nitrite: NEGATIVE
Protein, ur: 30 mg/dL — AB
Specific Gravity, Urine: 1.011 (ref 1.005–1.030)
pH: 6 (ref 5.0–8.0)

## 2019-12-14 LAB — CBC
HCT: 40.3 % (ref 36.0–46.0)
Hemoglobin: 13.4 g/dL (ref 12.0–15.0)
MCH: 24.5 pg — ABNORMAL LOW (ref 26.0–34.0)
MCHC: 33.3 g/dL (ref 30.0–36.0)
MCV: 73.7 fL — ABNORMAL LOW (ref 80.0–100.0)
Platelets: 224 10*3/uL (ref 150–400)
RBC: 5.47 MIL/uL — ABNORMAL HIGH (ref 3.87–5.11)
RDW: 14.6 % (ref 11.5–15.5)
WBC: 9.4 10*3/uL (ref 4.0–10.5)
nRBC: 0 % (ref 0.0–0.2)

## 2019-12-14 LAB — HCG, QUANTITATIVE, PREGNANCY: hCG, Beta Chain, Quant, S: 16269 m[IU]/mL — ABNORMAL HIGH (ref <5)

## 2019-12-14 NOTE — Discharge Instructions (Signed)

## 2019-12-14 NOTE — MAU Provider Note (Addendum)
History     CSN: [id number]  Arrival date and time: 12/14/19 1334   First Provider Initiated Contact with Patient 12/14/19 1427      Chief Complaint  Patient presents with  . Vaginal Bleeding  . Nausea   [name] is a 24 y.o. G3P0020 at [redacted]w[redacted]d who presents to MAU for vaginal bleeding which began about a week ago, became very light by Friday and then experienced a gush/couple clot around midnight and is now experiencing light brown bleeding. Patient has known [facility] and is RH negative and received RhoGAM 12/08/2019.  Passing blood clots? Per above Blood soaking clothes? no Lightheaded/dizzy? no Significant pelvic pain or cramping? no  Current pregnancy problems? [facility] Blood Type? O NEGATIVE Allergies? NKDA Current medications? PNVs, AMOX (for GBS UTI) Current [facility] & next appt? [facility], 12/22/2019  Pt denies vaginal discharge/odor/itching. Pt denies N/V, abdominal pain, constipation, diarrhea, or urinary problems. Pt denies fever, chills, fatigue, sweating or changes in appetite. Pt denies SOB or chest pain. Pt denies dizziness, HA, light-headedness, weakness.   OB History    Gravida  3   Para  0   Term  0   Preterm  0   AB  2   Living  0     SAB  2   TAB  0   Ectopic  0   Multiple  0   Live Births  0           Past Medical History:  Diagnosis Date  . Asthma   . Migraines   . Polycystic ovarian syndrome   . Sickle cell trait [facility])     Past Surgical History:  Procedure Laterality Date  . TONSILLECTOMY    . WISDOM TOOTH EXTRACTION  2017    Family History  Problem Relation Age of Onset  . Sickle cell anemia Mother   . Cancer Maternal Grandmother        lung  . Migraines Maternal Grandmother   . Lupus Maternal Grandmother     Social History   Tobacco Use  . Smoking status: Light Tobacco Smoker    Types: Cigarettes  . Smokeless tobacco: Never Used  Substance Use Topics  . Alcohol use: No    Alcohol/week: 0.0 standard drinks  .  Drug use: Not Currently    Types: Marijuana    Allergies: No Known Allergies  Medications Prior to Admission  Medication Sig Dispense Refill Last Dose  . albuterol (VENTOLIN HFA) 108 (90 Base) MCG/ACT inhaler Inhale 1-2 puffs into the lungs every 6 (six) hours as needed for wheezing or shortness of breath. 1 g 1   . amoxicillin (AMOXIL) 500 MG capsule Take 1 capsule (500 mg total) by mouth 3 (three) times daily. 21 capsule 0   . Prenatal Vit-Fe Fumarate-FA (PRENATAL MULTIVITAMIN) TABS tablet Take 1 tablet by mouth daily at 12 noon.       Review of Systems  Constitutional: Negative for chills, diaphoresis, fatigue and fever.  Eyes: Negative for visual disturbance.  Respiratory: Negative for shortness of breath.   Cardiovascular: Negative for chest pain.  Gastrointestinal: Negative for abdominal pain, constipation, diarrhea, nausea and vomiting.  Genitourinary: Positive for vaginal bleeding. Negative for dysuria, flank pain, frequency, pelvic pain, urgency and vaginal discharge.  Neurological: Negative for dizziness, weakness, light-headedness and headaches.   Physical Exam   Blood pressure 116/73, pulse 100, temperature 99 F (37.2 C), temperature source Oral, resp. rate 16, height 5\' 1"  (1.549 m), weight 79.9 kg, last menstrual period  10/28/2019, SpO2 100 %.  Patient Vitals for the past 24 hrs:  BP Temp Temp src Pulse Resp SpO2 Height Weight  12/14/19 1702 116/73 - - 100 16 100 % - -  12/14/19 1402 114/68 99 F (37.2 C) Oral 94 16 100 % - -  12/14/19 1356 - - - - - - 5\' 1"  (1.549 m) 79.9 kg   Physical Exam  Constitutional: She is oriented to person, place, and time. She appears well-developed and well-nourished. No distress.  HENT:  Head: Normocephalic and atraumatic.  Respiratory: Effort normal.  Neurological: She is alert and oriented to person, place, and time.  Skin: She is not diaphoretic.  Psychiatric: She has a normal mood and affect. Her behavior is normal. Judgment  and thought content normal.   Results for orders placed or performed during the hospital encounter of 12/14/19 (from the past 24 hour(s))  Urinalysis, Routine w reflex microscopic     Status: Abnormal   Collection Time: 12/14/19  2:24 PM  Result Value Ref Range   Color, Urine YELLOW YELLOW   APPearance CLOUDY (A) CLEAR   Specific Gravity, Urine 1.011 1.005 - 1.030   pH 6.0 5.0 - 8.0   Glucose, UA NEGATIVE NEGATIVE mg/dL   Hgb urine dipstick LARGE (A) NEGATIVE   Bilirubin Urine NEGATIVE NEGATIVE   Ketones, ur NEGATIVE NEGATIVE mg/dL   Protein, ur 30 (A) NEGATIVE mg/dL   Nitrite NEGATIVE NEGATIVE   Leukocytes,Ua NEGATIVE NEGATIVE   RBC / HPF 11-20 0 - 5 RBC/hpf   WBC, UA 6-10 0 - 5 WBC/hpf   Bacteria, UA FEW (A) NONE SEEN   Squamous Epithelial / LPF 21-50 0 - 5   Mucus PRESENT   hCG, quantitative, pregnancy     Status: Abnormal   Collection Time: 12/14/19  2:36 PM  Result Value Ref Range   hCG, Beta Chain, Quant, S 16,269 (H) <5 mIU/mL  CBC     Status: Abnormal   Collection Time: 12/14/19  2:36 PM  Result Value Ref Range   WBC 9.4 4.0 - 10.5 K/uL   RBC 5.47 (H) 3.87 - 5.11 MIL/uL   Hemoglobin 13.4 12.0 - 15.0 g/dL   HCT 40.3 36.0 - 46.0 %   MCV 73.7 (L) 80.0 - 100.0 fL   MCH 24.5 (L) 26.0 - 34.0 pg   MCHC 33.3 30.0 - 36.0 g/dL   RDW 14.6 11.5 - 15.5 %   Platelets 224 150 - 400 K/uL   nRBC 0.0 0.0 - 0.2 %    US OB LESS THAN 14 WEEKS WITH OB TRANSVAGINAL  Result Date: 12/14/2019 CLINICAL DATA:  Vaginal bleeding. EXAM: OBSTETRIC <14 WK [place] AND TRANSVAGINAL OB US TECHNIQUE: Both transabdominal and transvaginal ultrasound examinations were performed for complete evaluation of the gestation as well as the maternal uterus, adnexal regions, and pelvic cul-de-sac. Transvaginal technique was performed to assess early pregnancy. COMPARISON:  None. FINDINGS: Intrauterine gestational sac: Single Yolk sac:  Visualized. Embryo:  Visualized. Cardiac Activity: Visualized. Heart Rate: 102 bpm  MSD: 10.73 mm   5 w   5 d CRL:  1.7 mm    w    d                  [place] EDC: Subchorionic hemorrhage:  Small Maternal uterus/adnexae: Normal IMPRESSION: Single live IUP with a small subchorionic hemorrhage. Electronically Signed   By: Dorise Bullion III M.D   On: 12/14/2019 16:40   US OB LESS THAN 14 WEEKS WITH  OB TRANSVAGINAL  Result Date: 12/07/2019 CLINICAL DATA:  Initial evaluation for acute vaginal bleeding. Early pregnancy. EXAM: OBSTETRIC <14 WK [place] AND TRANSVAGINAL OB US TECHNIQUE: Both transabdominal and transvaginal ultrasound examinations were performed for complete evaluation of the gestation as well as the maternal uterus, adnexal regions, and pelvic cul-de-sac. Transvaginal technique was performed to assess early pregnancy. COMPARISON:  None. FINDINGS: Intrauterine gestational sac: Single. Yolk sac:  Present. Embryo:  Not visualized. Cardiac Activity: Negative. Heart Rate: N/A  bpm MSD: 4.2 mm   5 w   1 d Subchorionic hemorrhage: Small subchorionic hemorrhage measures 0.8 x 0.3 x 1.2 cm without associated mass effect. Maternal uterus/adnexae: Ovaries are normal in appearance bilaterally. Corpus luteal cyst noted on the left. Associated small volume free fluid within the pelvis. No adnexal mass. IMPRESSION: 1. Early intrauterine gestational sac with internal yolk sac, but no fetal pole or cardiac activity yet visualized. Recommend follow-up quantitative B-HCG levels and follow-up US in 14 days to confirm and assess viability. 2. Small subchorionic hemorrhage without associated mass effect. 3. Degenerating left ovarian corpus luteal cyst with associated small volume free fluid within the pelvis. 4. No other acute maternal uterine or adnexal abnormality identified. Electronically Signed   By: Rise Mu M.D.   On: 12/07/2019 23:43    MAU Course  Procedures  MDM -VB in pregnancy with known SCH -O NEGATIVE -RhoGAM given 12/08/2019 -UA: cloudy/lg hgb/30PRO/few bacteria, sending urine for  culture -CBC: no abnormalities requiring treatment -hCG: 16,269 -[place]: single IUP, [redacted]w[redacted]d, sm [facility] -pt discharged to home in stable condition  Orders Placed This Encounter  Procedures  . Culture, OB Urine    Standing Status:   Standing    Number of Occurrences:   1  . US OB LESS THAN 14 WEEKS WITH OB TRANSVAGINAL    Standing Status:   Standing    Number of Occurrences:   1    Order Specific Question:   Symptom/Reason for Exam    Answer:   Vaginal bleeding in pregnancy [[id number]]  . Urinalysis, Routine w reflex microscopic    Standing Status:   Standing    Number of Occurrences:   1  . hCG, quantitative, pregnancy    Standing Status:   Standing    Number of Occurrences:   1  . CBC    Standing Status:   Standing    Number of Occurrences:   1  . Discharge patient    Order Specific Question:   Discharge disposition    Answer:   01-Home or Self Care [1]    Order Specific Question:   Discharge patient date    Answer:   12/14/2019   No orders of the defined types were placed in this encounter.  Assessment and Plan   1. Subchorionic hemorrhage of placenta in first trimester, single or unspecified fetus   2. Vaginal bleeding in pregnancy   3. Rh negative state in antepartum period   4. [redacted] weeks gestation of pregnancy   5. Intrauterine pregnancy     Allergies as of 12/14/2019   No Known Allergies     Medication List    TAKE these medications   albuterol 108 (90 Base) MCG/ACT inhaler Commonly known as: VENTOLIN HFA Inhale 1-2 puffs into the lungs every 6 (six) hours as needed for wheezing or shortness of breath.   amoxicillin 500 MG capsule Commonly known as: AMOXIL Take 1 capsule (500 mg total) by mouth 3 (three) times daily.   prenatal multivitamin Tabs  tablet Take 1 tablet by mouth daily at 12 noon.      -work note given for today only per pt request -continue ABX -keep PN appt 12/22/2018 -discussed expected s/sx of [facility] -return MAU precautions given -pt discharged to  home in stable condition  Joni Reining E Nugent 12/14/2019, 5:04 PM

## 2019-12-14 NOTE — MAU Note (Signed)
Bailey Rose is a 24 y.o. at [redacted]w[redacted]d here in MAU reporting: states she has been bleeding since last [facility] visit last weekend. Last night was having clots. This AM saw a clot, bleeding does not fill up a pad. States she feels "empty". Having intermittent cramping, none at this time. Does have some nausea.   Onset of complaint: ongoing  Pain score: 0/10  Vitals:   12/14/19 1402  BP: 114/68  Pulse: 94  Resp: 16  Temp: 99 F (37.2 C)  SpO2: 100%     Lab orders placed from triage: UA

## 2019-12-17 ENCOUNTER — Telehealth: Payer: Self-pay | Admitting: *Deleted

## 2019-12-17 ENCOUNTER — Other Ambulatory Visit: Payer: Medicaid Other

## 2019-12-17 ENCOUNTER — Encounter: Payer: Self-pay | Admitting: Family Medicine

## 2019-12-17 LAB — CULTURE, OB URINE: Culture: 10000 — AB

## 2019-12-17 NOTE — Telephone Encounter (Addendum)
Pt left VM message stating that she would like a doctor's note. No further explanation or information given in the message.   1545  Call returned to pt and she states that she came to office today and received the letter needed.

## 2019-12-18 ENCOUNTER — Ambulatory Visit: Payer: Medicaid Other

## 2019-12-19 ENCOUNTER — Inpatient Hospital Stay (HOSPITAL_COMMUNITY)
Admission: AD | Admit: 2019-12-19 | Discharge: 2019-12-19 | Disposition: A | Discharge status: Home / Self Care | Payer: Medicaid Other | Attending: Obstetrics and Gynecology | Admitting: Obstetrics and Gynecology

## 2019-12-19 ENCOUNTER — Encounter (HOSPITAL_COMMUNITY): Payer: Self-pay | Admitting: Obstetrics and Gynecology

## 2019-12-19 ENCOUNTER — Other Ambulatory Visit: Payer: Self-pay

## 2019-12-19 ENCOUNTER — Inpatient Hospital Stay (HOSPITAL_COMMUNITY): Payer: Medicaid Other

## 2019-12-19 DIAGNOSIS — E282 Polycystic ovarian syndrome: Secondary | ICD-10-CM | POA: Insufficient documentation

## 2019-12-19 DIAGNOSIS — Z79899 Other long term (current) drug therapy: Secondary | ICD-10-CM | POA: Insufficient documentation

## 2019-12-19 DIAGNOSIS — O99331 Smoking (tobacco) complicating pregnancy, first trimester: Secondary | ICD-10-CM | POA: Diagnosis not present

## 2019-12-19 DIAGNOSIS — O99281 Endocrine, nutritional and metabolic diseases complicating pregnancy, first trimester: Secondary | ICD-10-CM | POA: Diagnosis not present

## 2019-12-19 DIAGNOSIS — J45909 Unspecified asthma, uncomplicated: Secondary | ICD-10-CM | POA: Diagnosis not present

## 2019-12-19 DIAGNOSIS — D573 Sickle-cell trait: Secondary | ICD-10-CM | POA: Diagnosis not present

## 2019-12-19 DIAGNOSIS — O26891 Other specified pregnancy related conditions, first trimester: Secondary | ICD-10-CM | POA: Diagnosis not present

## 2019-12-19 DIAGNOSIS — O26899 Other specified pregnancy related conditions, unspecified trimester: Secondary | ICD-10-CM

## 2019-12-19 DIAGNOSIS — R109 Unspecified abdominal pain: Secondary | ICD-10-CM | POA: Diagnosis not present

## 2019-12-19 DIAGNOSIS — O4690 Antepartum hemorrhage, unspecified, unspecified trimester: Secondary | ICD-10-CM | POA: Diagnosis not present

## 2019-12-19 DIAGNOSIS — Z3A01 Less than 8 weeks gestation of pregnancy: Secondary | ICD-10-CM | POA: Diagnosis not present

## 2019-12-19 DIAGNOSIS — O418X10 Other specified disorders of amniotic fluid and membranes, first trimester, not applicable or unspecified: Secondary | ICD-10-CM

## 2019-12-19 DIAGNOSIS — Z6791 Unspecified blood type, Rh negative: Secondary | ICD-10-CM

## 2019-12-19 DIAGNOSIS — O208 Other hemorrhage in early pregnancy: Secondary | ICD-10-CM | POA: Diagnosis not present

## 2019-12-19 DIAGNOSIS — O99511 Diseases of the respiratory system complicating pregnancy, first trimester: Secondary | ICD-10-CM | POA: Diagnosis not present

## 2019-12-19 DIAGNOSIS — O468X1 Other antepartum hemorrhage, first trimester: Secondary | ICD-10-CM | POA: Diagnosis not present

## 2019-12-19 DIAGNOSIS — F17210 Nicotine dependence, cigarettes, uncomplicated: Secondary | ICD-10-CM | POA: Insufficient documentation

## 2019-12-19 LAB — CBC
HCT: 41.3 % (ref 36.0–46.0)
Hemoglobin: 13.6 g/dL (ref 12.0–15.0)
MCH: 24.2 pg — ABNORMAL LOW (ref 26.0–34.0)
MCHC: 32.9 g/dL (ref 30.0–36.0)
MCV: 73.6 fL — ABNORMAL LOW (ref 80.0–100.0)
Platelets: 238 10*3/uL (ref 150–400)
RBC: 5.61 MIL/uL — ABNORMAL HIGH (ref 3.87–5.11)
RDW: 14.5 % (ref 11.5–15.5)
WBC: 15.5 10*3/uL — ABNORMAL HIGH (ref 4.0–10.5)
nRBC: 0 % (ref 0.0–0.2)

## 2019-12-19 NOTE — MAU Provider Note (Signed)
History     CSN: [id number]  Arrival date and time: 12/19/19 2117   First Provider Initiated Contact with Patient 12/19/19 2200      Chief Complaint  Patient presents with  . Vaginal Bleeding   Bailey Rose is a 24 y.o. G3P0 at [redacted]w[redacted]d who presents to MAU with complaints of vaginal bleeding and abdominal pain. Patient reports that she was diagnosed with a [facility] earlier this pregnancy. Bleeding stopped 4-5 days ago then restarted tonight around 2115. Patient reports bright red vaginal bleeding through underwear. Was not wearing a pad or panty liner on arrival to MAU, small amount of smearing on pad in MAU. She denies passing blood clots or products since bleeding restarted tonight. She denies intercourse since diagnoses of [facility]. She reports intermittent lower abdominal cramping since finding out she was pregnant, denies worsening or change in abdominal pain tonight since bleeding returned.    OB History    Gravida  3   Para  0   Term  0   Preterm  0   AB  2   Living  0     SAB  2   TAB  0   Ectopic  0   Multiple  0   Live Births  0           Past Medical History:  Diagnosis Date  . Asthma   . Migraines   . Polycystic ovarian syndrome   . Sickle cell trait [facility])     Past Surgical History:  Procedure Laterality Date  . TONSILLECTOMY    . WISDOM TOOTH EXTRACTION  2017    Family History  Problem Relation Age of Onset  . Sickle cell anemia Mother   . Cancer Maternal Grandmother        lung  . Migraines Maternal Grandmother   . Lupus Maternal Grandmother     Social History   Tobacco Use  . Smoking status: Light Tobacco Smoker    Types: Cigarettes  . Smokeless tobacco: Never Used  Substance Use Topics  . Alcohol use: No    Alcohol/week: 0.0 standard drinks  . Drug use: Not Currently    Types: Marijuana    Allergies: No Known Allergies  Medications Prior to Admission  Medication Sig Dispense Refill Last Dose  . albuterol (VENTOLIN HFA) 108 (90  Base) MCG/ACT inhaler Inhale 1-2 puffs into the lungs every 6 (six) hours as needed for wheezing or shortness of breath. 1 g 1 12/19/2019 at 1200  . amoxicillin (AMOXIL) 500 MG capsule Take 1 capsule (500 mg total) by mouth 3 (three) times daily. 21 capsule 0 12/18/2019 at Unknown time  . Prenatal Vit-Fe Fumarate-FA (PRENATAL MULTIVITAMIN) TABS tablet Take 1 tablet by mouth daily at 12 noon.       Review of Systems  Constitutional: Negative.   Respiratory: Negative.   Cardiovascular: Negative.   Gastrointestinal: Positive for abdominal pain. Negative for constipation, diarrhea, nausea and vomiting.       Lower abdominal cramping   Genitourinary: Positive for vaginal bleeding. Negative for difficulty urinating, dysuria, hematuria, pelvic pain and urgency.  Musculoskeletal: Negative.   Neurological: Negative.    Physical Exam   Blood pressure 109/62, pulse 83, temperature 99.6 F (37.6 C), temperature source Oral, resp. rate 20, height 5' (1.524 m), weight 80.2 kg, last menstrual period 10/28/2019.  Physical Exam  Nursing note and vitals reviewed. Constitutional: She is oriented to person, place, and time. She appears well-developed and well-nourished. No distress.  Cardiovascular: Normal  rate and regular rhythm.  Respiratory: Effort normal and breath sounds normal. No respiratory distress. She has no wheezes.  GI: Soft. She exhibits no distension. There is no abdominal tenderness. There is no rebound and no guarding.  Genitourinary:    Vaginal bleeding present.  There is bleeding in the vagina.    Genitourinary Comments: Pelvic exam: Cervix pink, visually closed, without lesion, small amount of bright red vaginal bleeding (2 faux swabs), vaginal walls and external genitalia normal   Musculoskeletal:        General: No edema. Normal range of motion.  Neurological: She is alert and oriented to person, place, and time.  Psychiatric: She has a normal mood and affect. Her behavior is normal.  Thought content normal.   Pt informed that the ultrasound is considered a limited OB ultrasound and is not intended to be a complete ultrasound exam.  Patient also informed that the ultrasound is not being completed with the intent of assessing for fetal or placental anomalies or any pelvic abnormalities.  Explained that the purpose of today's ultrasound is to assess for  viability.  Patient acknowledges the purpose of the exam and the limitations of the study.    MAU Course  Procedures  MDM Bedside ultrasound performed to assess FHT, unable to evaluate on abdominal ultrasound need for vaginal [place].  CBC ordered to assess Hgb level with continued vaginal bleeding   Orders Placed This Encounter  Procedures  . US OB Transvaginal  . CBC   Labs and [place] report reviewed:  Results for orders placed or performed during the hospital encounter of 12/19/19 (from the past 24 hour(s))  CBC     Status: Abnormal   Collection Time: 12/19/19 10:34 PM  Result Value Ref Range   WBC 15.5 (H) 4.0 - 10.5 K/uL   RBC 5.61 (H) 3.87 - 5.11 MIL/uL   Hemoglobin 13.6 12.0 - 15.0 g/dL   HCT 46.2 70.3 - 50.0 %   MCV 73.6 (L) 80.0 - 100.0 fL   MCH 24.2 (L) 26.0 - 34.0 pg   MCHC 32.9 30.0 - 36.0 g/dL   RDW 93.8 18.2 - 99.3 %   Platelets 238 150 - 400 K/uL   nRBC 0.0 0.0 - 0.2 %   US OB Transvaginal  Result Date: 12/19/2019 CLINICAL DATA:  Bleeding, cramping EXAM: TRANSVAGINAL OB ULTRASOUND TECHNIQUE: Transvaginal ultrasound was performed for complete evaluation of the gestation as well as the maternal uterus, adnexal regions, and pelvic cul-de-sac. COMPARISON:  None. FINDINGS: Intrauterine gestational sac: Single Yolk sac:  Visualized Embryo:  Visualized Cardiac Activity: Visualized Heart Rate: 129 bpm MSD:   mm    w     d CRL:   6.5 mm   6 w 3 d                  [place] EDC: 08/10/2020 Subchorionic hemorrhage:  Moderate subchorionic hemorrhage Maternal uterus/adnexae: No adnexal mass or free fluid. IMPRESSION: Six week 3  day intrauterine pregnancy. Fetal heart rate 129 beats per minute. Moderate subchorionic hemorrhage. Electronically Signed   By: Charlett Nose M.D.   On: 12/19/2019 22:58   RHogam not needed today, given on 12/08/19 CBC WNL   Discussed with patient results of [place], FHR 129 and moderate SCH found. Discussed with patient that [facility] is getting larger from past [place]. [facility] precautions reviewed and what to expect. Educated and discussed reasons to return to MAU. Follow up as scheduled in the office.   Assessment and Plan  1. Subchorionic hematoma in first trimester, single or unspecified fetus   2. Vaginal bleeding during pregnancy, antepartum   3. Abdominal cramping affecting pregnancy   4. [redacted] weeks gestation of pregnancy   5. Rh negative state in antepartum period    Discharge home Encouraged to make appointment to initiate prenatal care  Return to MAU as needed for reasons discussed and/or emergencies  [facility] precautions and what to expect  Pelvic rest   Follow-up Information    [facility] Follow up.   Specialty: Obstetrics and Gynecology Why: Return to MAU as needed for emergencies  Contact information: [address] [phone] [place] [phone]         Allergies as of 12/19/2019   No Known Allergies     Medication List    TAKE these medications   albuterol 108 (90 Base) MCG/ACT inhaler Commonly known as: VENTOLIN HFA Inhale 1-2 puffs into the lungs every 6 (six) hours as needed for wheezing or shortness of breath.   amoxicillin 500 MG capsule Commonly known as: AMOXIL Take 1 capsule (500 mg total) by mouth 3 (three) times daily.   prenatal multivitamin Tabs tablet Take 1 tablet by mouth daily at 12 noon.       Lajean Manes CNM 12/20/2019, 12:32 AM

## 2019-12-19 NOTE — MAU Note (Signed)
PT SAYS HAS BEEN HERE BEFORE FOR BLEEDING AND CRAMPING . SHE STOPPED X4 DAYS  AND STARTED AGAIN AT 915PM.  ALSO FEELS CRAMPING. IN TRIAGE- NO  PAD. IN UNDERWEAR- LIGHT VAG BLEEDING.

## 2019-12-22 ENCOUNTER — Telehealth: Payer: Self-pay | Admitting: Family Medicine

## 2019-12-22 ENCOUNTER — Telehealth: Payer: Self-pay

## 2019-12-22 ENCOUNTER — Ambulatory Visit: Admission: RE | Admit: 2019-12-22 | Payer: Medicaid Other | Source: Ambulatory Visit

## 2019-12-22 NOTE — Telephone Encounter (Signed)
Received a call from the patient requesting to come in on this Friday to start care. I informed her we would schedule her for a nurse visit, then she would be scheduled to see the provider. She asked if we had other locations. I informed her we two other locations in [place], one in [place], one in [place], and another one at [facility]. She said okay, I'm going to call another location.

## 2019-12-22 NOTE — Telephone Encounter (Signed)
Pt called requesting work note. Called pt and pt asked if she could get work note stating that she can go can into work later due to morning sickness and a work restriction letter.  I informed pt that she would need to be established with an OB provider first and then they would be able to evaluate that then.  Pt asked to schedule an appt.  Pt call transferred to the front office.   Addison Naegeli, RN

## 2019-12-22 NOTE — Telephone Encounter (Signed)
Pt concern addressed.  Bailey Rose

## 2020-01-08 ENCOUNTER — Inpatient Hospital Stay (HOSPITAL_COMMUNITY)
Admission: AD | Admit: 2020-01-08 | Discharge: 2020-01-08 | Disposition: A | Discharge status: Home / Self Care | Payer: Medicaid Other | Source: Ambulatory Visit | Attending: Obstetrics and Gynecology | Admitting: Obstetrics and Gynecology

## 2020-01-08 ENCOUNTER — Encounter (HOSPITAL_COMMUNITY): Payer: Self-pay | Admitting: Obstetrics and Gynecology

## 2020-01-08 ENCOUNTER — Other Ambulatory Visit: Payer: Self-pay

## 2020-01-08 DIAGNOSIS — O99511 Diseases of the respiratory system complicating pregnancy, first trimester: Secondary | ICD-10-CM | POA: Diagnosis not present

## 2020-01-08 DIAGNOSIS — Z87891 Personal history of nicotine dependence: Secondary | ICD-10-CM | POA: Diagnosis not present

## 2020-01-08 DIAGNOSIS — O26811 Pregnancy related exhaustion and fatigue, first trimester: Secondary | ICD-10-CM | POA: Diagnosis not present

## 2020-01-08 DIAGNOSIS — K5900 Constipation, unspecified: Secondary | ICD-10-CM | POA: Diagnosis not present

## 2020-01-08 DIAGNOSIS — K30 Functional dyspepsia: Secondary | ICD-10-CM

## 2020-01-08 DIAGNOSIS — Z3A10 10 weeks gestation of pregnancy: Secondary | ICD-10-CM | POA: Insufficient documentation

## 2020-01-08 DIAGNOSIS — J45909 Unspecified asthma, uncomplicated: Secondary | ICD-10-CM | POA: Diagnosis not present

## 2020-01-08 DIAGNOSIS — R109 Unspecified abdominal pain: Secondary | ICD-10-CM | POA: Diagnosis not present

## 2020-01-08 DIAGNOSIS — O26891 Other specified pregnancy related conditions, first trimester: Secondary | ICD-10-CM | POA: Insufficient documentation

## 2020-01-08 DIAGNOSIS — O99011 Anemia complicating pregnancy, first trimester: Secondary | ICD-10-CM | POA: Insufficient documentation

## 2020-01-08 DIAGNOSIS — O99611 Diseases of the digestive system complicating pregnancy, first trimester: Secondary | ICD-10-CM

## 2020-01-08 DIAGNOSIS — D573 Sickle-cell trait: Secondary | ICD-10-CM | POA: Diagnosis not present

## 2020-01-08 DIAGNOSIS — O26819 Pregnancy related exhaustion and fatigue, unspecified trimester: Secondary | ICD-10-CM | POA: Diagnosis present

## 2020-01-08 DIAGNOSIS — O26899 Other specified pregnancy related conditions, unspecified trimester: Secondary | ICD-10-CM

## 2020-01-08 HISTORY — DX: Functional dyspepsia: K30

## 2020-01-08 LAB — URINALYSIS, ROUTINE W REFLEX MICROSCOPIC
Bacteria, UA: NONE SEEN
Bilirubin Urine: NEGATIVE
Glucose, UA: NEGATIVE mg/dL
Ketones, ur: NEGATIVE mg/dL
Leukocytes,Ua: NEGATIVE
Nitrite: NEGATIVE
Protein, ur: NEGATIVE mg/dL
Specific Gravity, Urine: 1.01 (ref 1.005–1.030)
pH: 5 (ref 5.0–8.0)

## 2020-01-08 MED ORDER — CITRANATAL HARMONY 27-1-260 MG PO CAPS: 1.0000 | ORAL_CAPSULE | Freq: Every day | ORAL | 12 refills | Status: DC

## 2020-01-08 MED ORDER — POLYETHYLENE GLYCOL 3350 17 G PO PACK: 17.0000 g | PACK | Freq: Every day | ORAL | 0 refills | Status: DC

## 2020-01-08 MED ORDER — METOCLOPRAMIDE HCL 5 MG PO TABS
5.0000 mg | ORAL_TABLET | Freq: Three times a day (TID) | ORAL | 0 refills | Status: DC | PRN
Start: 2020-01-08 — End: 2021-02-18

## 2020-01-08 NOTE — Discharge Instructions (Signed)
Constipation, Adult Constipation is when a person:  Poops (has a bowel movement) fewer times in a week than normal.  Has a hard time pooping.  Has poop that is dry, hard, or bigger than normal. Follow these instructions at home: Eating and drinking   Eat foods that have a lot of fiber, such as: ? Fresh fruits and vegetables. ? Whole grains. ? Beans.  Eat less of foods that are high in fat, low in fiber, or overly processed, such as: ? [place] fries. ? Hamburgers. ? Cookies. ? Candy. ? Soda.  Drink enough fluid to keep your pee (urine) clear or pale yellow. General instructions  Exercise regularly or as told by your doctor.  Go to the restroom when you feel like you need to poop. Do not hold it in.  Take over-the-counter and prescription medicines only as told by your doctor. These include any fiber supplements.  Do pelvic floor retraining exercises, such as: ? Doing deep breathing while relaxing your lower belly (abdomen). ? Relaxing your pelvic floor while pooping.  Watch your condition for any changes.  Keep all follow-up visits as told by your doctor. This is important. Contact a doctor if:  You have pain that gets worse.  You have a fever.  You have not pooped for 4 days.  You throw up (vomit).  You are not hungry.  You lose weight.  You are bleeding from the anus.  You have thin, pencil-like poop (stool). Get help right away if:  You have a fever, and your symptoms suddenly get worse.  You leak poop or have blood in your poop.  Your belly feels hard or bigger than normal (is bloated).  You have very bad belly pain.  You feel dizzy or you faint. This information is not intended to replace advice given to you by your health care provider. Make sure you discuss any questions you have with your health care provider. Document Revised: 06/15/2017 Document Reviewed: 12/22/2015 Elsevier Patient Education  2020 [place] have constipation  which is hard stools that are difficult to pass. It is important to have regular bowel movements every 1-3 days that are soft and easy to pass. Hard stools increase your risk of hemorrhoids and are very uncomfortable.   To prevent constipation you can increase the amount of fiber in your diet. Examples of foods with fiber are leafy greens, whole grain breads, oatmeal and other grains.  It is also important to drink at least eight 8oz glass of water everyday.   If you have not has a bowel movement in 4-5 days you made need to clean out your bowel.  This will have establish normal movement through your bowel.     Miralax Clean out  Take 8 capfuls of miralax in 64 oz of gatorade. You can use any fluid that appeals to you (gatorade, water, juice)  Continue to drink at least eight 8 oz glasses of water throughout the day You can repeat with another 8 capfuls of miralax in 64 oz of gatorade if you are not having a large amount of stools Fatigue If you have fatigue, you feel tired all the time and have a lack of energy or a lack of motivation. Fatigue may make it difficult to start or complete tasks because of exhaustion. In general, occasional or mild fatigue is often a normal response to activity or life. However, long-lasting (chronic) or extreme fatigue may be a symptom of a medical condition. Follow these instructions  at home: General instructions  Watch your fatigue for any changes.  Go to bed and get up at the same time every day.  Avoid fatigue by pacing yourself during the day and getting enough sleep at night.  Maintain a healthy weight. Medicines  Take over-the-counter and prescription medicines only as told by your health care provider.  Take a multivitamin, if told by your health care provider.  Do not use herbal or dietary supplements unless they are approved by your health care provider. Activity   Exercise regularly, as told by your health care provider.  Use or  practice techniques to help you relax, such as yoga, tai chi, meditation, or massage therapy. Eating and drinking   Avoid heavy meals in the evening.  Eat a well-balanced diet, which includes lean proteins, whole grains, plenty of fruits and vegetables, and low-fat dairy products.  Avoid consuming too much caffeine.  Avoid the use of alcohol.  Drink enough fluid to keep your urine pale yellow. Lifestyle  Change situations that cause you stress. Try to keep your work and personal schedule in balance.  Do not use any products that contain nicotine or tobacco, such as cigarettes and e-cigarettes. If you need help quitting, ask your health care provider.  Do not use drugs. Contact a health care provider if:  Your fatigue does not get better.  You have a fever.  You suddenly lose or gain weight.  You have headaches.  You have trouble falling asleep or sleeping through the night.  You feel angry, guilty, anxious, or sad.  You are unable to have a bowel movement (constipation).  Your skin is dry.  You have swelling in your legs or another part of your body. Get help right away if:  You feel confused.  Your vision is blurry.  You feel faint or you pass out.  You have a severe headache.  You have severe pain in your abdomen, your back, or the area between your waist and hips (pelvis).  You have chest pain, shortness of breath, or an irregular or fast heartbeat.  You are unable to urinate, or you urinate less than normal.  You have abnormal bleeding, such as bleeding from the rectum, vagina, nose, lungs, or nipples.  You vomit blood.  You have thoughts about hurting yourself or others. If you ever feel like you may hurt yourself or others, or have thoughts about taking your own life, get help right away. You can go to your nearest emergency department or call:  Your local emergency services (911 in the [place]  A suicide crisis helpline, such as the [place] at (661)839-4345. This is open 24 hours a day. Summary  If you have fatigue, you feel tired all the time and have a lack of energy or a lack of motivation.  Fatigue may make it difficult to start or complete tasks because of exhaustion.  Long-lasting (chronic) or extreme fatigue may be a symptom of a medical condition.  Exercise regularly, as told by your health care provider.  Change situations that cause you stress. Try to keep your work and personal schedule in balance. This information is not intended to replace advice given to you by your health care provider. Make sure you discuss any questions you have with your health care provider. Document Revised: 01/22/2019 Document Reviewed: 03/28/2017 Elsevier Patient Education  2020 [place] will need to be at home and close to a bathroom for about 8  hours when you do the above as you may need to go to the bathroom frequently.   After you are cleaned out: - Start Colace159m twice daily - Start Miralax once daily - Start a daily fiber supplement like metamucil or citrucel - You can safely use enemas in pregnancy  - if you are having diarrhea you can reduce to Colace once a day or miralax every other day or a 1/2 capful daily.     First Trimester of Pregnancy The first trimester of pregnancy is from week 1 until the end of week 13 (months 1 through 3). A week after a sperm fertilizes an egg, the egg will implant on the wall of the uterus. This embryo will begin to develop into a baby. Genes from you and your partner will form the baby. The female genes will determine whether the baby will be a boy or a girl. At 6-8 weeks, the eyes and face will be formed, and the heartbeat can be seen on ultrasound. At the end of 12 weeks, all the baby's organs will be formed. Now that you are pregnant, you will want to do everything you can to have a healthy baby. Two of the most important things are to get good  prenatal care and to follow your health care provider's instructions. Prenatal care is all the medical care you receive before the baby's birth. This care will help prevent, find, and treat any problems during the pregnancy and childbirth. Body changes during your first trimester Your body goes through many changes during pregnancy. The changes vary from woman to woman.  You may gain or lose a couple of pounds at first.  You may feel sick to your stomach (nauseous) and you may throw up (vomit). If the vomiting is uncontrollable, call your health care provider.  You may tire easily.  You may develop headaches that can be relieved by medicines. All medicines should be approved by your health care provider.  You may urinate more often. Painful urination may mean you have a bladder infection.  You may develop heartburn as a result of your pregnancy.  You may develop constipation because certain hormones are causing the muscles that push stool through your intestines to slow down.  You may develop hemorrhoids or swollen veins (varicose veins).  Your breasts may begin to grow larger and become tender. Your nipples may stick out more, and the tissue that surrounds them (areola) may become darker.  Your gums may bleed and may be sensitive to brushing and flossing.  Dark spots or blotches (chloasma, mask of pregnancy) may develop on your face. This will likely fade after the baby is born.  Your menstrual periods will stop.  You may have a loss of appetite.  You may develop cravings for certain kinds of food.  You may have changes in your emotions from day to day, such as being excited to be pregnant or being concerned that something may go wrong with the pregnancy and baby.  You may have more vivid and strange dreams.  You may have changes in your hair. These can include thickening of your hair, rapid growth, and changes in texture. Some women also have hair loss during or after pregnancy,  or hair that feels dry or thin. Your hair will most likely return to normal after your baby is born. What to expect at prenatal visits During a routine prenatal visit:  You will be weighed to make sure you and the baby are growing normally.  Your blood pressure will be taken.  Your abdomen will be measured to track your baby's growth.  The fetal heartbeat will be listened to between weeks 10 and 14 of your pregnancy.  Test results from any previous visits will be discussed. Your health care provider may ask you:  How you are feeling.  If you are feeling the baby move.  If you have had any abnormal symptoms, such as leaking fluid, bleeding, severe headaches, or abdominal cramping.  If you are using any tobacco products, including cigarettes, chewing tobacco, and electronic cigarettes.  If you have any questions. Other tests that may be performed during your first trimester include:  Blood tests to find your blood type and to check for the presence of any previous infections. The tests will also be used to check for low iron levels (anemia) and protein on red blood cells (Rh antibodies). Depending on your risk factors, or if you previously had diabetes during pregnancy, you may have tests to check for high blood sugar that affects pregnant women (gestational diabetes).  Urine tests to check for infections, diabetes, or protein in the urine.  An ultrasound to confirm the proper growth and development of the baby.  Fetal screens for spinal cord problems (spina bifida) and Down syndrome.  HIV (human immunodeficiency virus) testing. Routine prenatal testing includes screening for HIV, unless you choose not to have this test.  You may need other tests to make sure you and the baby are doing well. Follow these instructions at home: Medicines  Follow your health care provider's instructions regarding medicine use. Specific medicines may be either safe or unsafe to take during  pregnancy.  Take a prenatal vitamin that contains at least 600 micrograms (mcg) of folic acid.  If you develop constipation, try taking a stool softener if your health care provider approves. Eating and drinking   Eat a balanced diet that includes fresh fruits and vegetables, whole grains, good sources of protein such as meat, eggs, or tofu, and low-fat dairy. Your health care provider will help you determine the amount of weight gain that is right for you.  Avoid raw meat and uncooked cheese. These carry germs that can cause birth defects in the baby.  Eating four or five small meals rather than three large meals a day may help relieve nausea and vomiting. If you start to feel nauseous, eating a few soda crackers can be helpful. Drinking liquids between meals, instead of during meals, also seems to help ease nausea and vomiting.  Limit foods that are high in fat and processed sugars, such as fried and sweet foods.  To prevent constipation: ? Eat foods that are high in fiber, such as fresh fruits and vegetables, whole grains, and beans. ? Drink enough fluid to keep your urine clear or pale yellow. Activity  Exercise only as directed by your health care provider. Most women can continue their usual exercise routine during pregnancy. Try to exercise for 30 minutes at least 5 days a week. Exercising will help you: ? Control your weight. ? Stay in shape. ? Be prepared for labor and delivery.  Experiencing pain or cramping in the lower abdomen or lower back is a good sign that you should stop exercising. Check with your health care provider before continuing with normal exercises.  Try to avoid standing for long periods of time. Move your legs often if you must stand in one place for a long time.  Avoid heavy lifting.  Wear low-heeled shoes  and practice good posture.  You may continue to have sex unless your health care provider tells you not to. Relieving pain and discomfort  Wear a  good support bra to relieve breast tenderness.  Take warm sitz baths to soothe any pain or discomfort caused by hemorrhoids. Use hemorrhoid cream if your health care provider approves.  Rest with your legs elevated if you have leg cramps or low back pain.  If you develop varicose veins in your legs, wear support hose. Elevate your feet for 15 minutes, 3-4 times a day. Limit salt in your diet. Prenatal care  Schedule your prenatal visits by the twelfth week of pregnancy. They are usually scheduled monthly at first, then more often in the last 2 months before delivery.  Write down your questions. Take them to your prenatal visits.  Keep all your prenatal visits as told by your health care provider. This is important. Safety  Wear your seat belt at all times when driving.  Make a list of emergency phone numbers, including numbers for family, friends, the hospital, and police and fire departments. General instructions  Ask your health care provider for a referral to a local prenatal education class. Begin classes no later than the beginning of month 6 of your pregnancy.  Ask for help if you have counseling or nutritional needs during pregnancy. Your health care provider can offer advice or refer you to specialists for help with various needs.  Do not use hot tubs, steam rooms, or saunas.  Do not douche or use tampons or scented sanitary pads.  Do not cross your legs for long periods of time.  Avoid cat litter boxes and soil used by cats. These carry germs that can cause birth defects in the baby and possibly loss of the fetus by miscarriage or stillbirth.  Avoid all smoking, herbs, alcohol, and medicines not prescribed by your health care provider. Chemicals in these products affect the formation and growth of the baby.  Do not use any products that contain nicotine or tobacco, such as cigarettes and e-cigarettes. If you need help quitting, ask your health care provider. You may  receive counseling support and other resources to help you quit.  Schedule a dentist appointment. At home, brush your teeth with a soft toothbrush and be gentle when you floss. Contact a health care provider if:  You have dizziness.  You have mild pelvic cramps, pelvic pressure, or nagging pain in the abdominal area.  You have persistent nausea, vomiting, or diarrhea.  You have a bad smelling vaginal discharge.  You have pain when you urinate.  You notice increased swelling in your face, hands, legs, or ankles.  You are exposed to fifth disease or chickenpox.  You are exposed to [place] measles (rubella) and have never had it. Get help right away if:  You have a fever.  You are leaking fluid from your vagina.  You have spotting or bleeding from your vagina.  You have severe abdominal cramping or pain.  You have rapid weight gain or loss.  You vomit blood or material that looks like coffee grounds.  You develop a severe headache.  You have shortness of breath.  You have any kind of trauma, such as from a fall or a car accident. Summary  The first trimester of pregnancy is from week 1 until the end of week 13 (months 1 through 3).  Your body goes through many changes during pregnancy. The changes vary from woman to woman.  You  will have routine prenatal visits. During those visits, your health care provider will examine you, discuss any test results you may have, and talk with you about how you are feeling. This information is not intended to replace advice given to you by your health care provider. Make sure you discuss any questions you have with your health care provider. Document Revised: 06/15/2017 Document Reviewed: 06/14/2016 Elsevier Patient Education  2020 [place] for [employer] at [facility]       Phone: [phone]  [facility] [employer] at [place]   Phone: [place]  for [employer] at [place]  Phone: [place] for [place] at [facility]  Phone: [employer] for [place] at [place]  Phone: [place] for [place] at [facility]   Phone: [facility]       Phone: [phone]  [facility]    Phone: [phone]   [facility]    Phone: [phone]  [facility]    Phone: [place]    Phone: [phone]  [place] [facility]       Phone: [phone]   [place] [facility]  Phone: [place]    Phone: [phone]  [facility]   Phone: [phone]  [facility]      Phone: [phone]  [facility]    Phone: [phone]

## 2020-01-08 NOTE — MAU Note (Signed)
Pt reports to MAU stating she has a lot of things going on. Pt states she is having abdominal cramping that is on and off pt states it was a 9/10 prior to arrival but now is not having pain. Pt states she has been having a lot smaller poops and pt states she feels constipated. Pt last BM was 01/08/20. Pt denies bleeding. Pt reports she feels as if she cant eat no matter if it is healthy or not she always feels like it is just "sitting there and she is irritable." pt states she is sleeping "a lot." pt is not vomiting but is very nauseated.

## 2020-01-08 NOTE — MAU Provider Note (Signed)
Chief Complaint: Abdominal Pain, Nausea, and Constipation   First Provider Initiated Contact with Patient 01/08/20 0431        SUBJECTIVE HPI: Bailey Rose is a 24 y.o. G3P0020 at [redacted]w[redacted]d by LMP who presents to maternity admissions reporting multiple concerns.  States her primary concern is needing a note stating her limitations which prevent her from working so that she can get unemployment. States they "made me resign because they would not fire me"     States is very fatigued and sleeps for long periods. States has not had the energy to leave her house for weeks.  Also having feeling of fullness, but no vomiting.  States drinks a lot but feels dehydrated like her body cannot absorb water.    States has small hard stools.  .Has not tried any laxatives or fiber. Has intermittent abdominal cramps, but none now. States her rx for PNV expired and needs a new one (wants Chief of Staff).  Wants to know about genetic testing for her.  Wants her urine to be re-cultured because she only took 4 antibiotic pills and hopes she doesn't need to finish them.   She denies vaginal bleeding, vaginal itching/burning, urinary symptoms, h/a, dizziness,or fever/chills.     Abdominal Pain This is a recurrent problem. The onset quality is gradual. The problem occurs intermittently. The problem has been waxing and waning. The pain is located in the generalized abdominal region. The patient is experiencing no pain. The quality of the pain is colicky and cramping. Associated symptoms include constipation and nausea. Pertinent negatives include no diarrhea, fever, frequency, headaches, myalgias or vomiting. Nothing aggravates the pain. The pain is relieved by nothing. She has tried nothing for the symptoms.  Constipation This is a recurrent problem. The problem is unchanged. Her stool frequency is 4 to 5 times per week. The stool is described as firm. The patient is not on a high fiber diet. She does not exercise  regularly. There has been adequate water intake. Associated symptoms include abdominal pain and nausea. Pertinent negatives include no diarrhea, fever or vomiting. She has tried nothing for the symptoms.   RN Note: Pt reports to MAU stating she has a lot of things going on. Pt states she is having abdominal cramping that is on and off pt states it was a 9/10 prior to arrival but now is not having pain. Pt states she has been having a lot smaller poops and pt states she feels constipated. Pt last BM was 01/08/20. Pt denies bleeding. Pt reports she feels as if she cant eat no matter if it is healthy or not she always feels like it is just "sitting there and she is irritable." pt states she is sleeping "a lot." pt is not vomiting but is very nauseated.   Past Medical History:  Diagnosis Date  . Asthma   . Migraines   . Polycystic ovarian syndrome   . Sickle cell trait [facility])    Past Surgical History:  Procedure Laterality Date  . TONSILLECTOMY    . WISDOM TOOTH EXTRACTION  2017   Social History   Socioeconomic History  . Marital status: Single    Spouse name: Not on file  . Number of children: Not on file  . Years of education: Not on file  . Highest education level: Not on file  Occupational History  . Not on file  Tobacco Use  . Smoking status: Former Smoker    Types: Cigarettes  . Smokeless tobacco: Never Used  Vaping Use  . Vaping Use: Never used  Substance and Sexual Activity  . Alcohol use: No    Alcohol/week: 0.0 standard drinks  . Drug use: Not Currently    Types: Marijuana  . Sexual activity: Yes    Birth control/protection: Condom  Other Topics Concern  . Not on file  Social History Narrative  . Not on file   Social Determinants of Health   Financial Resource Strain:   . Difficulty of Paying Living Expenses:   Food Insecurity: Food Insecurity Present  . Worried About Programme researcher, broadcasting/film/video in the Last Year: Never true  . Ran Out of Food in the Last Year: Often  true  Transportation Needs: Unmet Transportation Needs  . Lack of Transportation (Medical): Yes  . Lack of Transportation (Non-Medical): Yes  Physical Activity:   . Days of Exercise per Week:   . Minutes of Exercise per Session:   Stress:   . Feeling of Stress :   Social Connections:   . Frequency of Communication with Friends and Family:   . Frequency of Social Gatherings with Friends and Family:   . Attends Religious Services:   . Active Member of Clubs or Organizations:   . Attends Banker Meetings:   [name] Marital Status:   Intimate Partner Violence:   . Fear of Current or Ex-Partner:   . Emotionally Abused:   [name] Physically Abused:   . Sexually Abused:    No current facility-administered medications on file prior to encounter.   Current Outpatient Medications on File Prior to Encounter  Medication Sig Dispense Refill  . albuterol (VENTOLIN HFA) 108 (90 Base) MCG/ACT inhaler Inhale 1-2 puffs into the lungs every 6 (six) hours as needed for wheezing or shortness of breath. 1 g 1  . amoxicillin (AMOXIL) 500 MG capsule Take 1 capsule (500 mg total) by mouth 3 (three) times daily. 21 capsule 0  . Prenatal Vit-Fe Fumarate-FA (PRENATAL MULTIVITAMIN) TABS tablet Take 1 tablet by mouth daily at 12 noon.    . [DISCONTINUED] budesonide (PULMICORT) 180 MCG/ACT inhaler Inhale 2 puffs into the lungs 2 (two) times daily after a meal. (Patient not taking: Reported on 04/25/2019) 1 each 1  . [DISCONTINUED] misoprostol (CYTOTEC) 200 MCG tablet Place one pill between gum and cheek on each side and two pills in the vagina. If no bleeding by Sunday morning, repeat. (Patient not taking: Reported on 04/25/2019) 4 tablet 1   No Known Allergies  I have reviewed patient's Past Medical Hx, Surgical Hx, Family Hx, Social Hx, medications and allergies.   ROS:  Review of Systems  Constitutional: Negative for fever.  Gastrointestinal: Positive for abdominal pain, constipation and nausea. Negative  for diarrhea and vomiting.  Genitourinary: Negative for frequency.  Musculoskeletal: Negative for myalgias.  Neurological: Negative for headaches.   Review of Systems  Other systems negative   Physical Exam  Physical Exam Patient Vitals for the past 24 hrs:  BP Temp Temp src Pulse Resp Weight  01/08/20 0412 113/81 98.7 F (37.1 C) Oral 91 19 80.3 kg   Constitutional: Well-developed, well-nourished female in no acute distress.  Cardiovascular: normal rate Respiratory: normal effort GI: Abd soft, non-tender. Pos BS x 4 MS: Extremities nontender, no edema, normal ROM Neurologic: Alert and oriented x 4.  GU: Neg CVAT.  PELVIC EXAM: deferred  FHT 150 by bedside US  LAB RESULTS Results for orders placed or performed during the hospital encounter of 01/08/20 (from the past 24 hour(s))  Urinalysis,  Routine w reflex microscopic     Status: Abnormal   Collection Time: 01/08/20  4:39 AM  Result Value Ref Range   Color, Urine YELLOW YELLOW   APPearance HAZY (A) CLEAR   Specific Gravity, Urine 1.010 1.005 - 1.030   pH 5.0 5.0 - 8.0   Glucose, UA NEGATIVE NEGATIVE mg/dL   Hgb urine dipstick SMALL (A) NEGATIVE   Bilirubin Urine NEGATIVE NEGATIVE   Ketones, ur NEGATIVE NEGATIVE mg/dL   Protein, ur NEGATIVE NEGATIVE mg/dL   Nitrite NEGATIVE NEGATIVE   Leukocytes,Ua NEGATIVE NEGATIVE   RBC / HPF 0-5 0 - 5 RBC/hpf   WBC, UA 0-5 0 - 5 WBC/hpf   Bacteria, UA NONE SEEN NONE SEEN   Squamous Epithelial / LPF 6-10 0 - 5   Mucus PRESENT      --/--/O NEG (05/23 2246)  IMAGING Pt informed that the ultrasound is considered a limited OB ultrasound and is not intended to be a complete ultrasound exam.  Patient also informed that the ultrasound is not being completed with the intent of assessing for fetal or placental anomalies or any pelvic abnormalities.  Explained that the purpose of today's ultrasound is to assess for viability.    Patient acknowledges the purpose of the exam and the  limitations of the study.    Bedside US done due to RN unable to hear FHTs withdoppler.  Active single fetus with heartbeat noted  MAU Management/MDM: Long discussion of normal physiologic changes of pregnancy Discussed delayed stomach emptying and constipation are common   Will try low dose Reglan.  Warned of possible extrapyramidal effects.  Stop med if she has them Will Rx Miralax for constipation Reassured her that she is not dehydrated.  Will reorder her Citranatal PNV.  List of offices provided Discussed prenatal genetic screening with [facility].  States wants genetic testing "just for me". Discussed she can ask about that when she goes for her prenatal visit.   ASSESSMENT Single IUP at [redacted]w[redacted]d Fatigue Delayed gastric emptying Constipation Intermittent abdominal cramping   PLAN Discharge home Rx Citranatal [place] PNV Rx Reglan for delayed emptying Rx Miralax for constipation Encouraged to schedule prenatal care Pt stable at time of discharge. Encouraged to return here or to other Urgent Care/ED if she develops worsening of symptoms, increase in pain, fever, or other concerning symptoms.    Hansel Feinstein CNM, MSN Certified Nurse-Midwife 01/08/2020  4:31 AM

## 2020-01-08 NOTE — MAU Note (Signed)
+  FHT on bedside US performed by Wynelle Bourgeois CNM.

## 2020-01-09 LAB — CULTURE, OB URINE: Culture: NO GROWTH

## 2020-06-30 ENCOUNTER — Ambulatory Visit (HOSPITAL_COMMUNITY)
Admission: EM | Admit: 2020-06-30 | Discharge: 2020-06-30 | Disposition: A | Discharge status: Home / Self Care | Payer: Medicaid Other | Attending: Family Medicine | Admitting: Family Medicine

## 2020-06-30 ENCOUNTER — Encounter (HOSPITAL_COMMUNITY): Payer: Self-pay

## 2020-06-30 DIAGNOSIS — M545 Low back pain, unspecified: Secondary | ICD-10-CM

## 2020-06-30 NOTE — ED Triage Notes (Signed)
Pt presents with back pain x last night. Pt states she is having migraines and is requesting a note to excuse her from work. Pt states she has a hx of back injury.

## 2020-07-03 NOTE — ED Provider Notes (Signed)
[facility]   [id number] 06/30/20 Arrival Time: 1705  ASSESSMENT & PLAN:  1. Acute bilateral low back pain without sciatica      Able to ambulate here and hemodynamically stable. No indication for imaging of back at this time given no trauma and normal neurological exam. Discussed. Work note provided. Prefers OTC analgesics. Encourage ROM/movement as tolerated.  Recommend:  Follow-up Information    Fleet Contras, MD.   Specialty: Internal Medicine Why: As needed. Contact information: [address] [place] [address] [phone]               Reviewed expectations re: course of current medical issues. Questions answered. Outlined signs and symptoms indicating need for more acute intervention. Patient verbalized understanding. After Visit Summary given.   SUBJECTIVE: History from: patient.  Bailey Rose is a 24 y.o. female who presents with complaint of lower back pain; abrupt onset last evening. Long-standing h/o similar. "Usually lasts a couple of days". Better with OTC analgesics. No LE sensation changes. Normal bowel/bladder habits. "Need a work note". Reports no chronic steroid use, fevers, IV drug use, or recent back surgeries or procedures.   OBJECTIVE:  Vitals:   06/30/20 1814  BP: 109/76  Pulse: 88  Resp: 18  Temp: 98.3 F (36.8 C)  TempSrc: Oral  SpO2: 98%    General appearance: alert; no distress HEENT: [place]; AT Neck: supple with FROM; without midline tenderness Lungs: unlabored respirations; speaks full sentences without difficulty Abdomen: soft Back: poorly localized tenderness to palpation over lumbar paraspinal musculature; FROM at waist; bruising: none; without midline tenderness Extremities: without edema; symmetrical without gross deformities; normal ROM of bilateral LE Skin: warm and dry Neurologic: normal gait; normal sensation and strength of bilateral LE Psychological: alert and cooperative; normal mood  and affect    No Known Allergies  Past Medical History:  Diagnosis Date  . Asthma   . Migraines   . Polycystic ovarian syndrome   . Sickle cell trait (HCC)    Social History   Socioeconomic History  . Marital status: Single    Spouse name: Not on file  . Number of children: Not on file  . Years of education: Not on file  . Highest education level: Not on file  Occupational History  . Not on file  Tobacco Use  . Smoking status: Former Smoker    Types: Cigarettes  . Smokeless tobacco: Never Used  Vaping Use  . Vaping Use: Never used  Substance and Sexual Activity  . Alcohol use: No    Alcohol/week: 0.0 standard drinks  . Drug use: Not Currently    Types: Marijuana  . Sexual activity: Yes    Birth control/protection: Condom  Other Topics Concern  . Not on file  Social History Narrative  . Not on file   Social Determinants of Health   Financial Resource Strain: Not on file  Food Insecurity: Food Insecurity Present  . Worried About Programme researcher, broadcasting/film/video in the Last Year: Never true  . Ran Out of Food in the Last Year: Often true  Transportation Needs: Unmet Transportation Needs  . Lack of Transportation (Medical): Yes  . Lack of Transportation (Non-Medical): Yes  Physical Activity: Not on file  Stress: Not on file  Social Connections: Not on file  Intimate Partner Violence: Not on file   Family History  Problem Relation Age of Onset  . Sickle cell anemia Mother   . Cancer Maternal Grandmother        lung  .  Migraines Maternal Grandmother   . Lupus Maternal Grandmother    Past Surgical History:  Procedure Laterality Date  . TONSILLECTOMY    . WISDOM TOOTH EXTRACTION  2017     Mardella Layman, MD 07/03/20 1006

## 2020-07-05 ENCOUNTER — Encounter (HOSPITAL_COMMUNITY): Payer: Self-pay

## 2020-07-05 ENCOUNTER — Other Ambulatory Visit: Payer: Self-pay

## 2020-07-05 ENCOUNTER — Ambulatory Visit (HOSPITAL_COMMUNITY)
Admission: RE | Admit: 2020-07-05 | Discharge: 2020-07-05 | Disposition: A | Discharge status: Home / Self Care | Payer: Medicaid Other | Source: Ambulatory Visit | Attending: Physician Assistant | Admitting: Physician Assistant

## 2020-07-05 VITALS — BP 116/82 | HR 92 | Temp 98.0°F | Resp 18

## 2020-07-05 DIAGNOSIS — R1032 Left lower quadrant pain: Secondary | ICD-10-CM

## 2020-07-05 LAB — POCT URINALYSIS DIPSTICK, ED / UC
Bilirubin Urine: NEGATIVE
Glucose, UA: NEGATIVE mg/dL
Hgb urine dipstick: NEGATIVE
Ketones, ur: NEGATIVE mg/dL
Leukocytes,Ua: NEGATIVE
Nitrite: NEGATIVE
Protein, ur: NEGATIVE mg/dL
Specific Gravity, Urine: 1.02 (ref 1.005–1.030)
Urobilinogen, UA: 0.2 mg/dL (ref 0.0–1.0)
pH: 7.5 (ref 5.0–8.0)

## 2020-07-05 NOTE — ED Provider Notes (Signed)
[facility]    CSN: [id number] Arrival date & time: 07/05/20  9767      History   Chief Complaint Chief Complaint  Patient presents with   Work Note    HPI Bailey Rose is a 24 y.o. female.   Pt complains of left lower abdominal pain that comes and goes.  She reports h/o ovarian cyst with similar pain in the past.  She reports yesterday she was unable to go to work due to the discomfort.  Request a work note, can return tomorrow.  She denies vaginal discharge, dysuria, hematuria, flank pain, fever, chills.  She is planning to follow up with OBGYN for further management of PCOS and ovarian cyst.  LMP ended 12/15.      Past Medical History:  Diagnosis Date   Asthma    Migraines    Polycystic ovarian syndrome    Sickle cell trait [facility])     Patient Active Problem List   Diagnosis Date Noted   Constipation 01/08/2020   Fatigue during pregnancy 01/08/2020   Delayed gastric emptying 01/08/2020   Group beta Strep positive 12/10/2019   Vaginal discharge 04/25/2019   Vaginal odor 04/25/2019   PCOS (polycystic ovarian syndrome) 03/18/2015   Acne cystica 02/10/2015   Dysmenorrhea 06/24/2013    Past Surgical History:  Procedure Laterality Date   TONSILLECTOMY     WISDOM TOOTH EXTRACTION  2017    OB History    Gravida  3   Para  0   Term  0   Preterm  0   AB  2   Living  0     SAB  2   IAB  0   Ectopic  0   Multiple  0   Live Births  0            Home Medications    Prior to Admission medications   Medication Sig Start Date End Date Taking? Authorizing Provider  albuterol (VENTOLIN HFA) 108 (90 Base) MCG/ACT inhaler Inhale 1-2 puffs into the lungs every 6 (six) hours as needed for wheezing or shortness of breath. 04/03/19   Marylene Land, CNM  amoxicillin (AMOXIL) 500 MG capsule Take 1 capsule (500 mg total) by mouth 3 (three) times daily. 12/08/19   Marny Lowenstein, PA-C  metoCLOPramide (REGLAN) 5 MG  tablet Take 1 tablet (5 mg total) by mouth every 8 (eight) hours as needed for nausea (feeling full). 01/08/20 02/07/20  Aviva Signs, CNM  polyethylene glycol (MIRALAX) 17 g packet Take 17 g by mouth daily. 01/08/20   Aviva Signs, CNM  Prenat-FeFmCb-DSS-FA-DHA w/o A (CITRANATAL HARMONY) 27-1-260 MG CAPS Take 1 tablet by mouth daily. 01/08/20   Aviva Signs, CNM  budesonide (PULMICORT) 180 MCG/ACT inhaler Inhale 2 puffs into the lungs 2 (two) times daily after a meal. Patient not taking: Reported on 04/25/2019 04/03/19 07/22/19  Marylene Land, CNM  misoprostol (CYTOTEC) 200 MCG tablet Place one pill between gum and cheek on each side and two pills in the vagina. If no bleeding by Sunday morning, repeat. Patient not taking: Reported on 04/25/2019 04/03/19 07/22/19  Marylene Land, CNM    Family History Family History  Problem Relation Age of Onset   Sickle cell anemia Mother    Cancer Maternal Grandmother        lung   Migraines Maternal Grandmother    Lupus Maternal Grandmother     Social History Social History   Tobacco Use  Smoking status: Former Smoker    Types: Cigarettes   Smokeless tobacco: Never Used  Building services engineer Use: Never used  Substance Use Topics   Alcohol use: No    Alcohol/week: 0.0 standard drinks   Drug use: Not Currently    Types: Marijuana     Allergies   Patient has no known allergies.   Review of Systems Review of Systems  Constitutional: Negative for chills and fever.  HENT: Negative for ear pain and sore throat.   Eyes: Negative for pain and visual disturbance.  Respiratory: Negative for cough and shortness of breath.   Cardiovascular: Negative for chest pain and palpitations.  Gastrointestinal: Positive for abdominal pain. Negative for vomiting.  Genitourinary: Negative for dysuria, hematuria, pelvic pain, urgency, vaginal discharge and vaginal pain.  Musculoskeletal: Negative for arthralgias and  back pain.  Skin: Negative for color change and rash.  Neurological: Negative for seizures and syncope.  All other systems reviewed and are negative.    Physical Exam Triage Vital Signs ED Triage Vitals  Enc Vitals Group     BP 07/05/20 1859 116/82     Pulse Rate 07/05/20 1859 92     Resp 07/05/20 1859 18     Temp 07/05/20 1859 98 F (36.7 C)     Temp Source 07/05/20 1859 Oral     SpO2 07/05/20 1859 98 %     Weight --      Height --      Head Circumference --      Peak Flow --      Pain Score 07/05/20 1858 4     Pain Loc --      Pain Edu? --      Excl. in GC? --    No data found.  Updated Vital Signs BP 116/82 (BP Location: Left Arm)    Pulse 92    Temp 98 F (36.7 C) (Oral)    Resp 18    LMP 06/24/2020 Comment: PCOS   SpO2 98%   Visual Acuity Right Eye Distance:   Left Eye Distance:   Bilateral Distance:    Right Eye Near:   Left Eye Near:    Bilateral Near:     Physical Exam Vitals and nursing note reviewed.  Constitutional:      General: She is not in acute distress.    Appearance: She is well-developed and well-nourished.  HENT:     Head: Normocephalic and atraumatic.  Eyes:     Conjunctiva/sclera: Conjunctivae normal.  Cardiovascular:     Rate and Rhythm: Normal rate and regular rhythm.     Heart sounds: No murmur heard.   Pulmonary:     Effort: Pulmonary effort is normal. No respiratory distress.     Breath sounds: Normal breath sounds.  Abdominal:     General: Bowel sounds are normal.     Palpations: Abdomen is soft.     Tenderness: There is no abdominal tenderness. There is no right CVA tenderness, left CVA tenderness or guarding.  Musculoskeletal:        General: No edema.     Cervical back: Neck supple.  Skin:    General: Skin is warm and dry.  Neurological:     Mental Status: She is alert.  Psychiatric:        Mood and Affect: Mood and affect normal.      UC Treatments / Results  Labs (all labs ordered are listed, but only  abnormal results are displayed)  Labs Reviewed  POCT URINALYSIS DIPSTICK, ED / UC    EKG   Radiology No results found.  Procedures Procedures (including critical care time)  Medications Ordered in UC Medications - No data to display  Initial Impression / Assessment and Plan / UC Course  I have reviewed the triage vital signs and the nursing notes.  Pertinent labs & imaging results that were available during my care of the patient were reviewed by me and considered in my medical decision making (see chart for details).     UA normal.  Pt given work note for today, will return to work tomorrow.  Information given for [facility]. Return precautions discussed.  Final Clinical Impressions(s) / UC Diagnoses   Final diagnoses:  None   Discharge Instructions   None    ED Prescriptions    None     PDMP not reviewed this encounter.   Jodell Cipro, PA-C 07/05/20 1921

## 2020-07-05 NOTE — Discharge Instructions (Signed)
Follow up with [facility] as discussed.

## 2020-07-05 NOTE — ED Triage Notes (Signed)
Pt needs a note to return to work; pt will follow up with [employer] for her [facility] and enlarged ovaries.

## 2020-08-19 ENCOUNTER — Encounter (HOSPITAL_COMMUNITY): Payer: Self-pay | Admitting: Emergency Medicine

## 2020-08-19 ENCOUNTER — Other Ambulatory Visit: Payer: Self-pay

## 2020-08-19 ENCOUNTER — Emergency Department (HOSPITAL_COMMUNITY): Payer: Medicaid Other

## 2020-08-19 ENCOUNTER — Emergency Department (HOSPITAL_COMMUNITY)
Admission: EM | Admit: 2020-08-19 | Discharge: 2020-08-19 | Disposition: A | Discharge status: Home / Self Care | Payer: Medicaid Other | Attending: Emergency Medicine | Admitting: Emergency Medicine

## 2020-08-19 ENCOUNTER — Emergency Department (HOSPITAL_BASED_OUTPATIENT_CLINIC_OR_DEPARTMENT_OTHER)
Admission: EM | Admit: 2020-08-19 | Discharge: 2020-08-19 | Disposition: A | Discharge status: Home / Self Care | Payer: Medicaid Other | Source: Home / Self Care | Attending: Emergency Medicine | Admitting: Emergency Medicine

## 2020-08-19 ENCOUNTER — Encounter (HOSPITAL_BASED_OUTPATIENT_CLINIC_OR_DEPARTMENT_OTHER): Payer: Self-pay | Admitting: *Deleted

## 2020-08-19 DIAGNOSIS — M791 Myalgia, unspecified site: Secondary | ICD-10-CM | POA: Insufficient documentation

## 2020-08-19 DIAGNOSIS — Y92410 Unspecified street and highway as the place of occurrence of the external cause: Secondary | ICD-10-CM | POA: Insufficient documentation

## 2020-08-19 DIAGNOSIS — R0781 Pleurodynia: Secondary | ICD-10-CM | POA: Diagnosis not present

## 2020-08-19 DIAGNOSIS — Z5321 Procedure and treatment not carried out due to patient leaving prior to being seen by health care provider: Secondary | ICD-10-CM | POA: Insufficient documentation

## 2020-08-19 DIAGNOSIS — M79602 Pain in left arm: Secondary | ICD-10-CM | POA: Insufficient documentation

## 2020-08-19 DIAGNOSIS — J45909 Unspecified asthma, uncomplicated: Secondary | ICD-10-CM | POA: Diagnosis not present

## 2020-08-19 DIAGNOSIS — M25512 Pain in left shoulder: Secondary | ICD-10-CM | POA: Insufficient documentation

## 2020-08-19 DIAGNOSIS — M79605 Pain in left leg: Secondary | ICD-10-CM | POA: Insufficient documentation

## 2020-08-19 DIAGNOSIS — Z87891 Personal history of nicotine dependence: Secondary | ICD-10-CM | POA: Insufficient documentation
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
## 2020-08-19 NOTE — ED Notes (Signed)
Pt cannot be found no answer
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
## 2020-08-31 ENCOUNTER — Encounter (HOSPITAL_BASED_OUTPATIENT_CLINIC_OR_DEPARTMENT_OTHER): Payer: Self-pay | Admitting: Emergency Medicine

## 2020-08-31 ENCOUNTER — Emergency Department (HOSPITAL_BASED_OUTPATIENT_CLINIC_OR_DEPARTMENT_OTHER): Payer: Medicaid Other

## 2020-08-31 ENCOUNTER — Other Ambulatory Visit: Payer: Self-pay

## 2020-08-31 ENCOUNTER — Emergency Department (HOSPITAL_BASED_OUTPATIENT_CLINIC_OR_DEPARTMENT_OTHER)
Admission: EM | Admit: 2020-08-31 | Discharge: 2020-08-31 | Disposition: A | Discharge status: Home / Self Care | Payer: Medicaid Other | Attending: Emergency Medicine | Admitting: Emergency Medicine

## 2020-08-31 ENCOUNTER — Ambulatory Visit
Admission: EM | Admit: 2020-08-31 | Discharge: 2020-08-31 | Disposition: A | Discharge status: Home / Self Care | Payer: Medicaid Other

## 2020-08-31 DIAGNOSIS — Z8669 Personal history of other diseases of the nervous system and sense organs: Secondary | ICD-10-CM | POA: Diagnosis not present

## 2020-08-31 DIAGNOSIS — R202 Paresthesia of skin: Secondary | ICD-10-CM | POA: Diagnosis not present

## 2020-08-31 DIAGNOSIS — R519 Headache, unspecified: Secondary | ICD-10-CM | POA: Diagnosis not present

## 2020-08-31 DIAGNOSIS — Z7951 Long term (current) use of inhaled steroids: Secondary | ICD-10-CM | POA: Insufficient documentation

## 2020-08-31 DIAGNOSIS — Y92410 Unspecified street and highway as the place of occurrence of the external cause: Secondary | ICD-10-CM | POA: Insufficient documentation

## 2020-08-31 DIAGNOSIS — R110 Nausea: Secondary | ICD-10-CM | POA: Diagnosis not present

## 2020-08-31 DIAGNOSIS — Z87891 Personal history of nicotine dependence: Secondary | ICD-10-CM | POA: Insufficient documentation

## 2020-08-31 DIAGNOSIS — M25552 Pain in left hip: Secondary | ICD-10-CM | POA: Insufficient documentation

## 2020-08-31 DIAGNOSIS — M545 Low back pain, unspecified: Secondary | ICD-10-CM | POA: Diagnosis not present

## 2020-08-31 DIAGNOSIS — M5459 Other low back pain: Secondary | ICD-10-CM | POA: Diagnosis not present

## 2020-08-31 DIAGNOSIS — J45909 Unspecified asthma, uncomplicated: Secondary | ICD-10-CM | POA: Insufficient documentation

## 2020-08-31 DIAGNOSIS — H5319 Other subjective visual disturbances: Secondary | ICD-10-CM | POA: Insufficient documentation

## 2020-08-31 LAB — PREGNANCY, URINE: Preg Test, Ur: NEGATIVE

## 2020-08-31 MED ORDER — PROCHLORPERAZINE MALEATE 10 MG PO TABS
10.0000 mg | ORAL_TABLET | Freq: Once | ORAL | Status: AC
  Administered 2020-08-31: 10 mg via ORAL
  Filled 2020-08-31: qty 1

## 2020-08-31 MED ORDER — ONDANSETRON HCL 4 MG PO TABS
4.0000 mg | ORAL_TABLET | Freq: Three times a day (TID) | ORAL | 0 refills | Status: DC | PRN
Start: 2020-08-31 — End: 2021-02-18

## 2020-08-31 MED ORDER — PROMETHAZINE HCL 25 MG PO TABS
12.5000 mg | ORAL_TABLET | Freq: Once | ORAL | Status: AC
  Administered 2020-08-31: 12.5 mg via ORAL
  Filled 2020-08-31: qty 1

## 2020-08-31 NOTE — ED Notes (Signed)
Pt made aware of need for urine sample.  
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
## 2020-08-31 NOTE — ED Notes (Signed)
Pt aware of need for urine specimen, unable to provide at this time. 

## 2020-08-31 NOTE — ED Notes (Signed)
ED Provider at bedside. 

## 2020-08-31 NOTE — ED Notes (Signed)
Patient transported to CT 

## 2020-08-31 NOTE — ED Notes (Signed)
Pt unable to provide urine sample at this time 

## 2020-08-31 NOTE — Discharge Instructions (Signed)
You have been seen here for headaches and lower back pain.  Exam and imaging all looks reassuring.  Given you a prescription for Zofran please take as needed for nausea.  I recommend over-the-counter pain medications like ibuprofen and or Tylenol every 6 hours as needed please follow dosage in the back of bottle.    It is possible that you maybe suffering from a concussion, I recommend giving your brain a rest, I would abstain from screen time, reading, strenuous mental activities as this can cause worsening headaches.  I would reintroduce them as tolerated.  Given the contact information for a sports medicine doctor they can help you with your back pain.  Also given you contact information for concussion clinic help you with your headaches.  Come back to the emergency department if you develop chest pain, shortness of breath, severe abdominal pain, uncontrolled nausea, vomiting, diarrhea.
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
## 2020-09-08 ENCOUNTER — Telehealth: Payer: Self-pay

## 2020-09-08 NOTE — Telephone Encounter (Signed)
Left message for patient to call back to schedule in concussion clinic.  

## 2020-09-14 NOTE — Telephone Encounter (Signed)
Correction: seeing Dr. [name] 09/15/2020

## 2020-09-14 NOTE — Telephone Encounter (Signed)
Scheduled with Katrinka Blazing 09/15/20.

## 2020-09-15 ENCOUNTER — Ambulatory Visit (INDEPENDENT_AMBULATORY_CARE_PROVIDER_SITE_OTHER): Payer: Medicaid Other | Admitting: Family Medicine

## 2020-09-15 ENCOUNTER — Other Ambulatory Visit: Payer: Self-pay

## 2020-09-15 VITALS — BP 106/7 | HR 87 | Ht <= 58 in | Wt 168.6 lb

## 2020-09-15 DIAGNOSIS — M25552 Pain in left hip: Secondary | ICD-10-CM

## 2020-09-15 DIAGNOSIS — S060X9A Concussion with loss of consciousness of unspecified duration, initial encounter: Secondary | ICD-10-CM

## 2020-09-15 DIAGNOSIS — S060X1A Concussion with loss of consciousness of 30 minutes or less, initial encounter: Secondary | ICD-10-CM

## 2020-09-15 DIAGNOSIS — M542 Cervicalgia: Secondary | ICD-10-CM | POA: Diagnosis not present

## 2020-09-15 DIAGNOSIS — M25551 Pain in right hip: Secondary | ICD-10-CM | POA: Diagnosis not present

## 2020-09-15 DIAGNOSIS — R454 Irritability and anger: Secondary | ICD-10-CM

## 2020-09-15 MED ORDER — AMBULATORY NON FORMULARY MEDICATION: 0 refills | Status: DC

## 2020-09-15 NOTE — Patient Instructions (Addendum)
Thank you for coming in today.  I've referred you to Physical Therapy.  Let us know if you don't hear from them in one week.  Consider nortriptyline.   Massage therapy.   Recheck with me in 3-4 weeks.   Let me know if you are not doing well.
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
## 2020-09-24 ENCOUNTER — Ambulatory Visit: Payer: No Typology Code available for payment source

## 2020-10-05 ENCOUNTER — Other Ambulatory Visit: Payer: Self-pay

## 2020-10-05 ENCOUNTER — Ambulatory Visit: Payer: Medicaid Other | Attending: Family Medicine

## 2020-10-05 DIAGNOSIS — R2681 Unsteadiness on feet: Secondary | ICD-10-CM | POA: Diagnosis present

## 2020-10-05 DIAGNOSIS — R42 Dizziness and giddiness: Secondary | ICD-10-CM | POA: Insufficient documentation

## 2020-10-05 DIAGNOSIS — M25552 Pain in left hip: Secondary | ICD-10-CM | POA: Diagnosis present

## 2020-10-05 DIAGNOSIS — R293 Abnormal posture: Secondary | ICD-10-CM | POA: Diagnosis present

## 2020-10-05 DIAGNOSIS — M542 Cervicalgia: Secondary | ICD-10-CM | POA: Diagnosis present

## 2020-10-05 DIAGNOSIS — M545 Low back pain, unspecified: Secondary | ICD-10-CM | POA: Diagnosis present

## 2020-10-05 DIAGNOSIS — R519 Headache, unspecified: Secondary | ICD-10-CM | POA: Insufficient documentation

## 2020-10-05 DIAGNOSIS — M25551 Pain in right hip: Secondary | ICD-10-CM | POA: Insufficient documentation
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
## 2020-10-05 NOTE — Patient Instructions (Signed)
Access Code: [email or web address] URL: [email or web address] Date: 10/05/2020 Prepared by: Claude Manges  Exercises Supine Cervical Rotation AROM on Pillow - 1 x daily - 7 x weekly - 2 sets - 10 reps Supine Deep Neck Flexor Training - 1 x daily - 7 x weekly - 2 sets - 10 reps  Patient Education Understanding Energy Conservation  Vestibular Rehab Exercise: smooth pursuits x 10 horizontal, x 10 vertical.

## 2020-10-13 ENCOUNTER — Ambulatory Visit: Payer: Medicaid Other

## 2020-10-19 ENCOUNTER — Other Ambulatory Visit: Payer: Self-pay

## 2020-10-19 ENCOUNTER — Ambulatory Visit: Payer: Medicaid Other | Attending: Family Medicine | Admitting: Physical Therapy

## 2020-10-19 ENCOUNTER — Ambulatory Visit: Payer: Medicaid Other | Admitting: Physical Therapy

## 2020-10-19 DIAGNOSIS — R519 Headache, unspecified: Secondary | ICD-10-CM | POA: Insufficient documentation

## 2020-10-19 DIAGNOSIS — M25552 Pain in left hip: Secondary | ICD-10-CM | POA: Insufficient documentation

## 2020-10-19 DIAGNOSIS — R2681 Unsteadiness on feet: Secondary | ICD-10-CM | POA: Insufficient documentation

## 2020-10-19 DIAGNOSIS — M542 Cervicalgia: Secondary | ICD-10-CM | POA: Insufficient documentation

## 2020-10-19 DIAGNOSIS — R42 Dizziness and giddiness: Secondary | ICD-10-CM | POA: Insufficient documentation

## 2020-10-19 DIAGNOSIS — M25551 Pain in right hip: Secondary | ICD-10-CM | POA: Insufficient documentation

## 2020-10-19 DIAGNOSIS — M545 Low back pain, unspecified: Secondary | ICD-10-CM | POA: Insufficient documentation

## 2020-10-19 DIAGNOSIS — R293 Abnormal posture: Secondary | ICD-10-CM | POA: Insufficient documentation

## 2020-10-21 ENCOUNTER — Ambulatory Visit: Payer: Medicaid Other

## 2020-10-22 ENCOUNTER — Other Ambulatory Visit: Payer: Self-pay

## 2020-10-22 ENCOUNTER — Ambulatory Visit: Payer: Medicaid Other | Admitting: Physical Therapy

## 2020-10-22 DIAGNOSIS — M25552 Pain in left hip: Secondary | ICD-10-CM | POA: Diagnosis present

## 2020-10-22 DIAGNOSIS — M545 Low back pain, unspecified: Secondary | ICD-10-CM | POA: Diagnosis present

## 2020-10-22 DIAGNOSIS — M542 Cervicalgia: Secondary | ICD-10-CM | POA: Diagnosis present

## 2020-10-22 DIAGNOSIS — R519 Headache, unspecified: Secondary | ICD-10-CM | POA: Diagnosis not present

## 2020-10-22 DIAGNOSIS — R2681 Unsteadiness on feet: Secondary | ICD-10-CM | POA: Diagnosis present

## 2020-10-22 DIAGNOSIS — R293 Abnormal posture: Secondary | ICD-10-CM | POA: Diagnosis present

## 2020-10-22 DIAGNOSIS — R42 Dizziness and giddiness: Secondary | ICD-10-CM

## 2020-10-22 DIAGNOSIS — M25551 Pain in right hip: Secondary | ICD-10-CM | POA: Diagnosis present

## 2020-10-22 NOTE — Therapy (Signed)
[facility] [place], [place], [address] Phone: [phone]   Fax:  [phone]  Physical Therapy Treatment  Patient Details  Name: Bailey Rose MRN: [id number] Date of Birth: 1996/05/22 Referring Provider (PT): Marikay Alar Date: 10/22/2020   PT End of Session - 10/22/20 0834    Visit Number 2    Number of Visits 17    Date for PT Re-Evaluation 11/30/20    PT Start Time 8299    PT Stop Time 0835    PT Time Calculation (min) 38 min           Past Medical History:  Diagnosis Date  . Asthma   . Migraines   . Polycystic ovarian syndrome   . Sickle cell trait [facility])     Past Surgical History:  Procedure Laterality Date  . TONSILLECTOMY    . WISDOM TOOTH EXTRACTION  2017    There were no vitals filed for this visit.   Subjective Assessment - 10/22/20 0757    Subjective pt arrived with hoodie up and sunglasses on. verb some better with HEP from here and seeing chiropractor. pt states slight HA this morning from drive here ( busy on road) and sun                             [facility] Adult PT Treatment/Exercise - 10/22/20 0001      Exercises   Exercises Neck;Shoulder      Neck Exercises: Supine   Cervical Isometrics Flexion;Left lateral flexion;Right lateral flexion;Extension;Right rotation;Left rotation;3 secs;10 reps    Neck Retraction 10 reps;3 secs   head on ball   Cervical Rotation 10 reps   head on ball   Lateral Flexion 10 reps   head on ball     Shoulder Exercises: Standing   Other Standing Exercises 2# shruggs, shld rolls backward, scap squeeze 10 x    Other Standing Exercises yellow tband scap stab 4 way 10 x      Manual Therapy   Manual Therapy Soft tissue mobilization;Passive ROM;Manual Traction    Soft tissue mobilization cerv paraspinals, traps    Passive ROM cerv    Manual Traction cerv                  PT Education - 10/22/20 0834    Education  Details yellow tband shld row,ext and ER. scap squeeze    Person(s) Educated Patient    Methods Explanation;Demonstration;Verbal cues    Comprehension Verbalized understanding;Returned demonstration            PT Short Term Goals - 10/22/20 0834      PT SHORT TERM GOAL #1   Title Independent with initial HEP    Status Achieved             PT Long Term Goals - 10/05/20 1721      PT LONG TERM GOAL #1   Title Independent with advanced HEP    Time 8    Period Weeks    Status New    Target Date 11/30/20      PT LONG TERM GOAL #2   Title Pt report diminished post concussive symptoms as evidenced by score of <3/12 on the RPQ-3 and < RPQ-12    Time 8    Period Weeks    Status New    Target Date 11/30/20      PT LONG TERM  GOAL #3   Title Pt will report improved ease and diminished symptoms with driving.    Time 8    Period Weeks    Status New    Target Date 11/30/20      PT LONG TERM GOAL #4   Title Pt will report </=1/10 pain in neck with at least 50% less frequent Headaches to facilitate ability to return to prior activities.    Time 8    Period Weeks    Status New    Target Date 11/30/20      PT LONG TERM GOAL #5   Title Pt will report decr back and hip pain with daily activities.    Time 8    Period Weeks    Status New    Target Date 11/30/20                 Plan - 10/22/20 0834    Clinical Impression Statement STG met. pt tolerated initial ther ex progress well but did do in tx room with light off. pt verb felt good with STW and ROM. pt has very good PROM of cerv spine. tightnes sin traps and subocc area noted, no c/o dizziness or increased HA with tx    PT Treatment/Interventions ADLs/Self Care Home Management;Cryotherapy;Electrical Stimulation;Iontophoresis 52m/ml Dexamethasone;Moist Heat;Canalith Repostioning;Neuromuscular re-education;Therapeutic exercise;Therapeutic activities;Functional mobility training;Patient/family education;Manual  techniques;Energy conservation;Vestibular;Taping;Dry needling    PT Next Visit Plan assess and progress. may need to tx in room to decrease stim           Patient will benefit from skilled therapeutic intervention in order to improve the following deficits and impairments:  Decreased range of motion,Increased muscle spasms,Dizziness,Decreased activity tolerance,Hypermobility,Pain,Improper body mechanics,Postural dysfunction,Decreased mobility,Decreased strength,Decreased balance  Visit Diagnosis: Intractable headache, unspecified chronicity pattern, unspecified headache type  Abnormal posture  Dizziness and giddiness     Problem List Patient Active Problem List   Diagnosis Date Noted  . Constipation 01/08/2020  . Fatigue during pregnancy 01/08/2020  . Delayed gastric emptying 01/08/2020  . Group beta Strep positive 12/10/2019  . Vaginal discharge 04/25/2019  . Vaginal odor 04/25/2019  . PCOS (polycystic ovarian syndrome) 03/18/2015  . Acne cystica 02/10/2015  . Dysmenorrhea 06/24/2013    Deshundra Waller,ANGIE PTA 10/22/2020, 8:41 AM  [place] [address]: [phone]  Fax:  [phone] Name: ELluvia GwynneMRN: [id number] of Birth: [birth year]

## 2020-10-26 ENCOUNTER — Ambulatory Visit: Payer: Medicaid Other

## 2020-10-26 ENCOUNTER — Other Ambulatory Visit: Payer: Self-pay

## 2020-10-26 DIAGNOSIS — R519 Headache, unspecified: Secondary | ICD-10-CM

## 2020-10-26 DIAGNOSIS — R42 Dizziness and giddiness: Secondary | ICD-10-CM

## 2020-10-26 DIAGNOSIS — M542 Cervicalgia: Secondary | ICD-10-CM

## 2020-10-26 DIAGNOSIS — M545 Low back pain, unspecified: Secondary | ICD-10-CM

## 2020-10-26 DIAGNOSIS — R293 Abnormal posture: Secondary | ICD-10-CM

## 2020-10-26 DIAGNOSIS — R2681 Unsteadiness on feet: Secondary | ICD-10-CM

## 2020-10-26 DIAGNOSIS — M25552 Pain in left hip: Secondary | ICD-10-CM
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
## 2020-10-28 ENCOUNTER — Ambulatory Visit: Payer: Medicaid Other

## 2020-11-03 ENCOUNTER — Other Ambulatory Visit: Payer: Self-pay

## 2020-11-03 ENCOUNTER — Encounter: Payer: Self-pay | Admitting: Physical Therapy

## 2020-11-03 ENCOUNTER — Ambulatory Visit: Payer: Medicaid Other | Admitting: Physical Therapy

## 2020-11-03 DIAGNOSIS — M545 Low back pain, unspecified: Secondary | ICD-10-CM

## 2020-11-03 DIAGNOSIS — R42 Dizziness and giddiness: Secondary | ICD-10-CM

## 2020-11-03 DIAGNOSIS — M25552 Pain in left hip: Secondary | ICD-10-CM

## 2020-11-03 DIAGNOSIS — R519 Headache, unspecified: Secondary | ICD-10-CM | POA: Diagnosis not present

## 2020-11-03 DIAGNOSIS — M542 Cervicalgia: Secondary | ICD-10-CM

## 2020-11-03 DIAGNOSIS — R293 Abnormal posture: Secondary | ICD-10-CM

## 2020-11-03 DIAGNOSIS — R2681 Unsteadiness on feet: Secondary | ICD-10-CM

## 2020-11-03 DIAGNOSIS — M25551 Pain in right hip: Secondary | ICD-10-CM

## 2020-11-03 NOTE — Therapy (Addendum)
[facility] [address] [place], [place], [address] Phone: [phone]   Fax:  [phone]  Physical Therapy Treatment  Patient Details  Name: Bailey Rose MRN: [id number] Date of Birth: Jul 12, 1996 Referring Provider (PT): Cheron Every Date: 11/03/2020   PT End of Session - 11/03/20 1611    Visit Number 4    Number of Visits 17    Date for PT Re-Evaluation 11/30/20    PT Start Time 1526    PT Stop Time 1609    PT Time Calculation (min) 43 min    Activity Tolerance Patient limited by pain    Behavior During Therapy [facility] for tasks assessed/performed           Past Medical History:  Diagnosis Date  . Asthma   . Migraines   . Polycystic ovarian syndrome   . Sickle cell trait [facility])     Past Surgical History:  Procedure Laterality Date  . TONSILLECTOMY    . WISDOM TOOTH EXTRACTION  2017    There were no vitals filed for this visit.   Subjective Assessment - 11/03/20 1522    Subjective Pt arrived with sunglasses on; states has a migraine today    Currently in Pain? Yes    Pain Score 5    states no back/neck pain today   Pain Location Head                             [facility] Adult PT Treatment/Exercise - 11/03/20 0001      Neck Exercises: Seated   Neck Retraction 20 reps;3 secs    Neck Retraction Limitations 2x10 into ball    Cervical Rotation 20 reps    Cervical Rotation Limitations 2x10 on ball    Lateral Flexion 20 reps    Lateral Flexion Limitations 2x10 on ball      Shoulder Exercises: Standing   Other Standing Exercises 2# shruggs- 2 x 10, shld rolls backward + scap squeeze 10 x    Other Standing Exercises red scap stab 3 way x10      Manual Therapy   Manual Therapy Soft tissue mobilization;Passive ROM;Manual Traction    Soft tissue mobilization cerv paraspinals, suboccitals, and UT    Passive ROM cervical ROM all directions    Manual Traction gentle cervical distraction             Trigger Point Dry Needling - 11/03/20 0001    Consent Given? Yes    Education Handout Provided Yes    Muscles Treated Head and Neck Upper trapezius;Suboccipitals    Upper Trapezius Response Twitch reponse elicited;Palpable increased muscle length                  PT Short Term Goals - 10/22/20 0834      PT SHORT TERM GOAL #1   Title Independent with initial HEP    Status Achieved             PT Long Term Goals - 10/05/20 1721      PT LONG TERM GOAL #1   Title Independent with advanced HEP    Time 8    Period Weeks    Status New    Target Date 11/30/20      PT LONG TERM GOAL #2   Title Pt report diminished post concussive symptoms as evidenced by score of <3/12 on the RPQ-3 and < RPQ-12  Time 8    Period Weeks    Status New    Target Date 11/30/20      PT LONG TERM GOAL #3   Title Pt will report improved ease and diminished symptoms with driving.    Time 8    Period Weeks    Status New    Target Date 11/30/20      PT LONG TERM GOAL #4   Title Pt will report </=1/10 pain in neck with at least 50% less frequent Headaches to facilitate ability to return to prior activities.    Time 8    Period Weeks    Status New    Target Date 11/30/20      PT LONG TERM GOAL #5   Title Pt will report decr back and hip pain with daily activities.    Time 8    Period Weeks    Status New    Target Date 11/30/20                 Plan - 11/03/20 1612    Clinical Impression Statement Pt tolerated progression of TE well today. Came in with baseline headache rated at 5/10 so completed majority of session in tx room. Did well with progression of band to red with no increase in pain. Trialed DN for UT/cervical; assess response next rx. Followed with STM with primary focus on suboccipitals. Pt reported decreased headache at end of session. Continue to progress to tolerance.    PT Treatment/Interventions ADLs/Self Care Home Management;Cryotherapy;Electrical  Stimulation;Iontophoresis 4mg /ml Dexamethasone;Moist Heat;Canalith Repostioning;Neuromuscular re-education;Therapeutic exercise;Therapeutic activities;Functional mobility training;Patient/family education;Manual techniques;Energy conservation;Vestibular;Taping;Dry needling    Consulted and Agree with Plan of Care Patient           Patient will benefit from skilled therapeutic intervention in order to improve the following deficits and impairments:  Decreased range of motion,Increased muscle spasms,Dizziness,Decreased activity tolerance,Hypermobility,Pain,Improper body mechanics,Postural dysfunction,Decreased mobility,Decreased strength,Decreased balance  Visit Diagnosis: Intractable headache, unspecified chronicity pattern, unspecified headache type  Abnormal posture  Unsteadiness on feet  Dizziness and giddiness  Low back pain, unspecified back pain laterality, unspecified chronicity, unspecified whether sciatica present  Cervicalgia  Pain in left hip  Pain in right hip     Problem List Patient Active Problem List   Diagnosis Date Noted  . Constipation 01/08/2020  . Fatigue during pregnancy 01/08/2020  . Delayed gastric emptying 01/08/2020  . Group beta Strep positive 12/10/2019  . Vaginal discharge 04/25/2019  . Vaginal odor 04/25/2019  . PCOS (polycystic ovarian syndrome) 03/18/2015  . Acne cystica 02/10/2015  . Dysmenorrhea 06/24/2013   14/03/2013, PT, DPT Lysle Rubens 11/03/2020, 5:27 PM  [facility] [address] [place], [place], [place] Phone: [phone]   Fax:  [phone]  Name: Bailey Rose MRN: Norm Parcel Date of Birth: [birth year]

## 2020-11-03 NOTE — Patient Instructions (Signed)

## 2020-11-04 ENCOUNTER — Ambulatory Visit: Payer: Medicaid Other | Admitting: Physical Therapy

## 2020-11-09 ENCOUNTER — Ambulatory Visit: Payer: Medicaid Other | Admitting: Physical Therapy

## 2020-11-09 ENCOUNTER — Encounter: Payer: Medicaid Other | Admitting: Physical Therapy

## 2020-11-10 ENCOUNTER — Ambulatory Visit: Payer: Medicaid Other

## 2020-11-10 ENCOUNTER — Other Ambulatory Visit: Payer: Self-pay

## 2020-11-10 DIAGNOSIS — M25552 Pain in left hip: Secondary | ICD-10-CM

## 2020-11-10 DIAGNOSIS — R293 Abnormal posture: Secondary | ICD-10-CM

## 2020-11-10 DIAGNOSIS — R2681 Unsteadiness on feet: Secondary | ICD-10-CM

## 2020-11-10 DIAGNOSIS — R42 Dizziness and giddiness: Secondary | ICD-10-CM

## 2020-11-10 DIAGNOSIS — M542 Cervicalgia: Secondary | ICD-10-CM

## 2020-11-10 DIAGNOSIS — R519 Headache, unspecified: Secondary | ICD-10-CM | POA: Diagnosis not present

## 2020-11-10 DIAGNOSIS — M545 Low back pain, unspecified: Secondary | ICD-10-CM

## 2020-11-10 DIAGNOSIS — M25551 Pain in right hip: Secondary | ICD-10-CM

## 2020-11-10 NOTE — Therapy (Signed)
[facility] [address] [place], [place], [address] Phone: [phone]   Fax:  [phone]  Physical Therapy Treatment  Patient Details  Name: Bailey Rose MRN: [id number] Date of Birth: Mar 24, 1996 Referring Provider (PT): Cheron Every Date: 11/10/2020   PT End of Session - 11/10/20 0920    Visit Number 5    Number of Visits 17    Date for PT Re-Evaluation 11/30/20    PT Start Time 0920    PT Stop Time 1008    PT Time Calculation (min) 48 min    Activity Tolerance Patient tolerated treatment well    Behavior During Therapy [facility] for tasks assessed/performed           Past Medical History:  Diagnosis Date  . Asthma   . Migraines   . Polycystic ovarian syndrome   . Sickle cell trait [facility])     Past Surgical History:  Procedure Laterality Date  . TONSILLECTOMY    . WISDOM TOOTH EXTRACTION  2017    There were no vitals filed for this visit.   Subjective Assessment - 11/10/20 0918    Subjective DN really helped last time. Headaches/migraines seem to be getting more spaced out. 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 In psychotherapy at the moment for anxiety/depression    Patient Stated Goals to decrease headaches and back pain, get back to normal    Currently in Pain? No/denies   some neck tension, decr post DN            [facility] Adult PT Treatment/Exercise - 11/10/20 0001      Exercises   Exercises Lumbar;Neck;Shoulder      Lumbar Exercises: Stretches   Other Lumbar Stretch Exercise Forward pball stretch x10 with 3-5 sec holds      Lumbar Exercises: Aerobic   Tread Mill [place] concussion test: began at 1.5 mph - able to complete 15 min without symptom exacerbation. 2 min seated rest initial with VS check pre. 2 min cool down + 2 min VS check post.      Manual Therapy   Manual Therapy Soft  tissue mobilization;Passive ROM;Manual Traction   Simultaneous filing. User may not have seen previous data.   Soft tissue mobilization cerv paraspinals, suboccitals, and UT. gentle pec stretch B   Simultaneous filing. User may not have seen previous data.   Passive ROM cervical ROM all directions    Manual Traction gentle cervical distraction      Neck Exercises: Stretches   Upper Trapezius Stretch Right;Left;2 reps;10 seconds            Trigger Point Dry Needling - 11/10/20 0001    Consent Given? Yes    Muscles Treated Back/Hip Erector spinae    Upper Trapezius Response Twitch reponse elicited;Palpable increased muscle length    Erector spinae Response Twitch response elicited                  PT Short Term Goals - 10/22/20 0834      PT SHORT TERM GOAL #1   Title Independent with initial HEP    Status Achieved             PT Long Term Goals - 10/05/20 1721      PT LONG TERM GOAL #1   Title Independent with advanced HEP    Time 8  Period Weeks    Status New    Target Date 11/30/20      PT LONG TERM GOAL #2   Title Pt report diminished post concussive symptoms as evidenced by score of <3/12 on the RPQ-3 and < RPQ-12    Time 8    Period Weeks    Status New    Target Date 11/30/20      PT LONG TERM GOAL #3   Title Pt will report improved ease and diminished symptoms with driving.    Time 8    Period Weeks    Status New    Target Date 11/30/20      PT LONG TERM GOAL #4   Title Pt will report </=1/10 pain in neck with at least 50% less frequent Headaches to facilitate ability to return to prior activities.    Time 8    Period Weeks    Status New    Target Date 11/30/20      PT LONG TERM GOAL #5   Title Pt will report decr back and hip pain with daily activities.    Time 8    Period Weeks    Status New    Target Date 11/30/20                 Plan - 11/10/20 1009    Clinical Impression Statement Session began with DN for UT/cervical  again, seemed to help previously. No baseline symptoms today, able and willing to trial treadmill testing today at lower speed and no symptoms where exacerbated - pt able to speak with therapist, VSS throughout. She reports back/hip pain seems to flareup with menstrual cycle, gets some glute pain. Went over ball rollout stretch for LB/hips with good toelrance and added to home program    PT Treatment/Interventions ADLs/Self Care Home Management;Cryotherapy;Electrical Stimulation;Iontophoresis 4mg /ml Dexamethasone;Moist Heat;Canalith Repostioning;Neuromuscular re-education;Therapeutic exercise;Therapeutic activities;Functional mobility training;Patient/family education;Manual techniques;Energy conservation;Vestibular;Taping;Dry needling    PT Next Visit Plan assess and progress. may need to tx in room to decrease stim. Plan to further assess endurance/activity tolerance with treadmill or bike testing and VS monitoring next visit.    Consulted and Agree with Plan of Care Patient           Patient will benefit from skilled therapeutic intervention in order to improve the following deficits and impairments:  Decreased range of motion,Increased muscle spasms,Dizziness,Decreased activity tolerance,Hypermobility,Pain,Improper body mechanics,Postural dysfunction,Decreased mobility,Decreased strength,Decreased balance  Visit Diagnosis: Intractable headache, unspecified chronicity pattern, unspecified headache type  Abnormal posture  Unsteadiness on feet  Dizziness and giddiness  Low back pain, unspecified back pain laterality, unspecified chronicity, unspecified whether sciatica present  Cervicalgia  Pain in left hip  Pain in right hip     Problem List Patient Active Problem List   Diagnosis Date Noted  . Constipation 01/08/2020  . Fatigue during pregnancy 01/08/2020  . Delayed gastric emptying 01/08/2020  . Group beta Strep positive 12/10/2019  . Vaginal discharge 04/25/2019  . Vaginal  odor 04/25/2019  . PCOS (polycystic ovarian syndrome) 03/18/2015  . Acne cystica 02/10/2015  . Dysmenorrhea 06/24/2013    14/03/2013, PT, DPT 11/10/2020, 10:13 AM  [facility] [address] [place], [place], [place] Phone: [phone]   Fax:  [phone]  Name: Juletta Berhe MRN: Norm Parcel Date of Birth: 07-13-1996

## 2020-11-11 ENCOUNTER — Ambulatory Visit: Payer: Medicaid Other

## 2020-11-11 DIAGNOSIS — M542 Cervicalgia: Secondary | ICD-10-CM

## 2020-11-11 DIAGNOSIS — M25552 Pain in left hip: Secondary | ICD-10-CM

## 2020-11-11 DIAGNOSIS — R519 Headache, unspecified: Secondary | ICD-10-CM

## 2020-11-11 DIAGNOSIS — R293 Abnormal posture: Secondary | ICD-10-CM

## 2020-11-11 DIAGNOSIS — M545 Low back pain, unspecified: Secondary | ICD-10-CM

## 2020-11-11 DIAGNOSIS — M25551 Pain in right hip: Secondary | ICD-10-CM

## 2020-11-11 DIAGNOSIS — R42 Dizziness and giddiness: Secondary | ICD-10-CM

## 2020-11-11 DIAGNOSIS — R2681 Unsteadiness on feet: Secondary | ICD-10-CM

## 2020-11-11 NOTE — Therapy (Signed)
[facility] [place] [address] [place], [place], [address] Phone: [phone]   Fax:  [phone]  Physical Therapy Treatment  Patient Details  Name: Bailey Rose MRN: [id number] Date of Birth: 1995-12-09 Referring Provider (PT): Cheron Every Date: 11/11/2020   PT End of Session - 11/11/20 1453    Visit Number 6    Number of Visits 17    Date for PT Re-Evaluation 11/30/20    PT Start Time 1445    PT Stop Time 1530    PT Time Calculation (min) 45 min    Activity Tolerance Patient tolerated treatment well    Behavior During Therapy [facility] for tasks assessed/performed           Past Medical History:  Diagnosis Date  . Asthma   . Migraines   . Polycystic ovarian syndrome   . Sickle cell trait [facility])     Past Surgical History:  Procedure Laterality Date  . TONSILLECTOMY    . WISDOM TOOTH EXTRACTION  2017    There were no vitals filed for this visit.   Subjective Assessment - 11/11/20 1452    Subjective Doing okay today. no pain or headache    Pertinent History PMH: asthma, migraines, PCOS, Sickle cell trait. In psychotherapy at the moment for anxiety/depression    Patient Stated Goals to decrease headaches and back pain, get back to normal    Currently in Pain? No/denies                             [facility] Adult PT Treatment/Exercise - 11/11/20 0001      Lumbar Exercises: Aerobic   Tread Mill Tread mill progression 1.5 mph to 2.5 mph 8 minutes      Lumbar Exercises: Supine   Bridge 20 reps    Basic Lumbar Stabilization 10 reps   tabletop alternating toe taps with PPT     Shoulder Exercises: Standing   Other Standing Exercises wall plank with alt shoulder taps 2 x 10 B , VC/TC for neutral spine and core engagement     Shoulder Exercises: Pulleys   Other Pulley Exercises 5# 10 x 2 extension, 10 x 2 horizontal ABD/row at 90deg. Cues needed for LUE positioning     Shoulder Exercises: Stretch    Other Shoulder Stretches 3 way pec stretch 15-20 sec each x 2  Lower trunk rotations x 10                 PT Education - 11/11/20 1529    Education Details updated HEP [facility] ) discussed addition of some strength and stability exercises. Added saccades. educated on use of DIY or purchased line guide to help with tolerance and focus with reading    Person(s) Educated Patient    Methods Explanation;Demonstration    Comprehension Verbalized understanding;Returned demonstration            PT Short Term Goals - 10/22/20 0834      PT SHORT TERM GOAL #1   Title Independent with initial HEP    Status Achieved             PT Long Term Goals - 10/05/20 1721      PT LONG TERM GOAL #1   Title Independent with advanced HEP    Time 8    Period Weeks    Status New    Target Date 11/30/20  PT LONG TERM GOAL #2   Title Pt report diminished post concussive symptoms as evidenced by score of <3/12 on the RPQ-3 and < RPQ-12    Time 8    Period Weeks    Status New    Target Date 11/30/20      PT LONG TERM GOAL #3   Title Pt will report improved ease and diminished symptoms with driving.    Time 8    Period Weeks    Status New    Target Date 11/30/20      PT LONG TERM GOAL #4   Title Pt will report </=1/10 pain in neck with at least 50% less frequent Headaches to facilitate ability to return to prior activities.    Time 8    Period Weeks    Status New    Target Date 11/30/20      PT LONG TERM GOAL #5   Title Pt will report decr back and hip pain with daily activities.    Time 8    Period Weeks    Status New    Target Date 11/30/20                 Plan - 11/11/20 1532    Clinical Impression Statement Pt w/o pain today so focus of session on progressing aerobic, strength and stability exercises. Pt overall with decr core stability but tolerated exercies well with verbal and tactile cueing for neutral spine and/or UE movements with exercises. She  disclosed that driving is a little better - drove on highway for first time but definitely wiht anxiety, also having some trouble with reading. Updated HEP with visual/vestib motion and saccades and educated on compensatory strategy of line guide as needed to help build tolerance to reading.    Rehab Potential Good    PT Frequency 2x / week    PT Duration 8 weeks    PT Treatment/Interventions ADLs/Self Care Home Management;Cryotherapy;Electrical Stimulation;Iontophoresis 4mg /ml Dexamethasone;Moist Heat;Canalith Repostioning;Neuromuscular re-education;Therapeutic exercise;Therapeutic activities;Functional mobility training;Patient/family education;Manual techniques;Energy conservation;Vestibular;Taping;Dry needling    PT Next Visit Plan assess and progress. may need to tx in room to decrease stim. Plan to further assess endurance/activity tolerance with treadmill or bike testing and VS monitoring next visit. possible decr to 1x/wk?    Consulted and Agree with Plan of Care Patient           Patient will benefit from skilled therapeutic intervention in order to improve the following deficits and impairments:  Decreased range of motion,Increased muscle spasms,Dizziness,Decreased activity tolerance,Hypermobility,Pain,Improper body mechanics,Postural dysfunction,Decreased mobility,Decreased strength,Decreased balance  Visit Diagnosis: Intractable headache, unspecified chronicity pattern, unspecified headache type  Abnormal posture  Unsteadiness on feet  Dizziness and giddiness  Low back pain, unspecified back pain laterality, unspecified chronicity, unspecified whether sciatica present  Cervicalgia  Pain in left hip  Pain in right hip     Problem List Patient Active Problem List   Diagnosis Date Noted  . Constipation 01/08/2020  . Fatigue during pregnancy 01/08/2020  . Delayed gastric emptying 01/08/2020  . Group beta Strep positive 12/10/2019  . Vaginal discharge 04/25/2019  .  Vaginal odor 04/25/2019  . PCOS (polycystic ovarian syndrome) 03/18/2015  . Acne cystica 02/10/2015  . Dysmenorrhea 06/24/2013    14/03/2013, PT, DPT 11/11/2020, 3:35 PM  [facility] [address] [place], [place], [place] Phone: [phone]   Fax:  [phone]  Name: Julie-Anne Torain MRN: Norm Parcel Date of Birth: [birth year]

## 2020-11-15 ENCOUNTER — Ambulatory Visit: Payer: Medicaid Other | Attending: Family Medicine | Admitting: Physical Therapy

## 2020-11-15 DIAGNOSIS — R42 Dizziness and giddiness: Secondary | ICD-10-CM | POA: Insufficient documentation

## 2020-11-15 DIAGNOSIS — R2681 Unsteadiness on feet: Secondary | ICD-10-CM | POA: Insufficient documentation

## 2020-11-15 DIAGNOSIS — M545 Low back pain, unspecified: Secondary | ICD-10-CM | POA: Insufficient documentation

## 2020-11-15 DIAGNOSIS — M25551 Pain in right hip: Secondary | ICD-10-CM | POA: Insufficient documentation

## 2020-11-15 DIAGNOSIS — R519 Headache, unspecified: Secondary | ICD-10-CM | POA: Insufficient documentation

## 2020-11-15 DIAGNOSIS — R293 Abnormal posture: Secondary | ICD-10-CM | POA: Insufficient documentation

## 2020-11-15 DIAGNOSIS — M25552 Pain in left hip: Secondary | ICD-10-CM | POA: Insufficient documentation

## 2020-11-15 DIAGNOSIS — M542 Cervicalgia: Secondary | ICD-10-CM | POA: Insufficient documentation

## 2020-11-17 ENCOUNTER — Ambulatory Visit: Payer: Medicaid Other

## 2020-11-17 ENCOUNTER — Other Ambulatory Visit: Payer: Self-pay

## 2020-11-17 DIAGNOSIS — M545 Low back pain, unspecified: Secondary | ICD-10-CM

## 2020-11-17 DIAGNOSIS — R519 Headache, unspecified: Secondary | ICD-10-CM | POA: Diagnosis present

## 2020-11-17 DIAGNOSIS — M25552 Pain in left hip: Secondary | ICD-10-CM | POA: Diagnosis present

## 2020-11-17 DIAGNOSIS — M542 Cervicalgia: Secondary | ICD-10-CM

## 2020-11-17 DIAGNOSIS — M25551 Pain in right hip: Secondary | ICD-10-CM | POA: Diagnosis present

## 2020-11-17 DIAGNOSIS — R42 Dizziness and giddiness: Secondary | ICD-10-CM

## 2020-11-17 DIAGNOSIS — R2681 Unsteadiness on feet: Secondary | ICD-10-CM

## 2020-11-17 DIAGNOSIS — R293 Abnormal posture: Secondary | ICD-10-CM
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
## 2020-11-22 ENCOUNTER — Ambulatory Visit: Payer: Medicaid Other

## 2020-11-24 ENCOUNTER — Ambulatory Visit: Payer: Medicaid Other

## 2020-11-24 ENCOUNTER — Other Ambulatory Visit: Payer: Self-pay

## 2020-11-24 DIAGNOSIS — R293 Abnormal posture: Secondary | ICD-10-CM | POA: Diagnosis not present

## 2020-11-24 DIAGNOSIS — M542 Cervicalgia: Secondary | ICD-10-CM

## 2020-11-24 DIAGNOSIS — R2681 Unsteadiness on feet: Secondary | ICD-10-CM

## 2020-11-24 DIAGNOSIS — R42 Dizziness and giddiness: Secondary | ICD-10-CM

## 2020-11-24 DIAGNOSIS — M25552 Pain in left hip: Secondary | ICD-10-CM

## 2020-11-24 DIAGNOSIS — M545 Low back pain, unspecified: Secondary | ICD-10-CM

## 2020-11-24 DIAGNOSIS — M25551 Pain in right hip: Secondary | ICD-10-CM

## 2020-11-24 DIAGNOSIS — R519 Headache, unspecified: Secondary | ICD-10-CM
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
## 2020-11-26 ENCOUNTER — Telehealth: Payer: Self-pay | Admitting: Family Medicine

## 2020-11-26 DIAGNOSIS — S060X1A Concussion with loss of consciousness of 30 minutes or less, initial encounter: Secondary | ICD-10-CM

## 2020-11-26 NOTE — Telephone Encounter (Signed)
Done.  Referral placed today you should hear soon

## 2020-11-26 NOTE — Telephone Encounter (Signed)
Patient called asking if a referral could be sent to [facility] for speech therapy.

## 2020-11-29 ENCOUNTER — Encounter: Payer: Medicaid Other | Admitting: Physical Therapy

## 2020-11-29 DIAGNOSIS — F321 Major depressive disorder, single episode, moderate: Secondary | ICD-10-CM | POA: Diagnosis not present

## 2020-11-30 ENCOUNTER — Telehealth: Payer: Self-pay | Admitting: Family Medicine

## 2020-11-30 DIAGNOSIS — S060X1A Concussion with loss of consciousness of 30 minutes or less, initial encounter: Secondary | ICD-10-CM

## 2020-11-30 DIAGNOSIS — S060X9A Concussion with loss of consciousness of unspecified duration, initial encounter: Secondary | ICD-10-CM

## 2020-11-30 NOTE — Telephone Encounter (Signed)
Referred to speech therapy

## 2020-11-30 NOTE — Telephone Encounter (Signed)
Called pt and left a VM informing her the referral to speech therapy has been placed.

## 2020-11-30 NOTE — Telephone Encounter (Signed)
-----   Message from Anson Crofts, PT sent at 11/18/2020  9:02 AM EDT ----- Regarding: Speech therapy referral Good morning,   In speaking to Fedra during her PT visit yesterday she is still experiencing some cognitive difficulties especially with memory and focus post concussion. I believe she may benefit from speech therapy, which we also have a our [employer] location, to help her in this domain. Would it be possible to send over a Speech therapy referral for her?  Best,  Claude Manges, PT, DPT

## 2020-12-01 ENCOUNTER — Ambulatory Visit: Payer: Medicaid Other | Admitting: Physical Therapy

## 2020-12-02 ENCOUNTER — Encounter: Payer: Medicaid Other | Admitting: Physical Therapy

## 2020-12-03 ENCOUNTER — Ambulatory Visit: Payer: Medicaid Other | Admitting: Speech Pathology

## 2020-12-13 DIAGNOSIS — F321 Major depressive disorder, single episode, moderate: Secondary | ICD-10-CM | POA: Diagnosis not present

## 2020-12-16 ENCOUNTER — Ambulatory Visit: Payer: Medicaid Other | Admitting: Speech Pathology

## 2020-12-16 ENCOUNTER — Encounter: Payer: Self-pay | Admitting: Speech Pathology

## 2020-12-16 ENCOUNTER — Other Ambulatory Visit: Payer: Self-pay

## 2020-12-16 ENCOUNTER — Ambulatory Visit: Payer: Medicaid Other | Attending: Family Medicine

## 2020-12-16 DIAGNOSIS — R293 Abnormal posture: Secondary | ICD-10-CM | POA: Diagnosis present

## 2020-12-16 DIAGNOSIS — M545 Low back pain, unspecified: Secondary | ICD-10-CM

## 2020-12-16 DIAGNOSIS — R2681 Unsteadiness on feet: Secondary | ICD-10-CM | POA: Diagnosis present

## 2020-12-16 DIAGNOSIS — M25551 Pain in right hip: Secondary | ICD-10-CM

## 2020-12-16 DIAGNOSIS — R42 Dizziness and giddiness: Secondary | ICD-10-CM | POA: Diagnosis present

## 2020-12-16 DIAGNOSIS — R519 Headache, unspecified: Secondary | ICD-10-CM

## 2020-12-16 DIAGNOSIS — R41841 Cognitive communication deficit: Secondary | ICD-10-CM

## 2020-12-16 DIAGNOSIS — M25552 Pain in left hip: Secondary | ICD-10-CM | POA: Diagnosis present

## 2020-12-16 DIAGNOSIS — R4701 Aphasia: Secondary | ICD-10-CM | POA: Insufficient documentation

## 2020-12-16 DIAGNOSIS — M542 Cervicalgia: Secondary | ICD-10-CM | POA: Insufficient documentation
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
## 2020-12-16 NOTE — Addendum Note (Signed)
Addended by: Anson Crofts on: 12/16/2020 01:17 PM   Modules accepted: Orders

## 2020-12-20 DIAGNOSIS — F321 Major depressive disorder, single episode, moderate: Secondary | ICD-10-CM | POA: Diagnosis not present

## 2020-12-21 ENCOUNTER — Ambulatory Visit: Payer: Medicaid Other | Admitting: Physical Therapy

## 2020-12-21 ENCOUNTER — Ambulatory Visit: Payer: Medicaid Other | Admitting: Speech Pathology

## 2020-12-22 ENCOUNTER — Telehealth: Payer: Self-pay | Admitting: Family Medicine

## 2020-12-22 DIAGNOSIS — M25559 Pain in unspecified hip: Secondary | ICD-10-CM

## 2020-12-22 NOTE — Telephone Encounter (Signed)
Hello Bailey Rose,  I received a message from your physical therapist. It seems as though some of your back and hip pain seem to cycle with your menstrual cycle.  Based on your history of PCOS and ovarian cyst it could be that some of the pain you are experiencing is because of OB/GYN related issues.  Your physical therapist thinks that you may benefit from a consultation and referral to OB/GYN.  Would you like me to place a referral?  Do you already have an established relationship with OB/GYN?  If so who?  Thanks, Clayburn Pert

## 2020-12-22 NOTE — Telephone Encounter (Signed)
-----   Message from Anson Crofts, PT sent at 12/16/2020  1:18 PM EDT ----- Regarding: PT progress Good afternoon,   I recently sent over a progress note regarding Bailey Rose. She is making some progress overall but it seems like her hip and back pain is coinciding pretty specifically with her menstrual cycle.  She has a history of PCOS and ovarian cysts and has not seen Gyn recently due to needing a referral. I spoke to her about reaching out to primary care but she does not have one established.  I think she would benefit from seeing gyn for further screening.   Please advise if this is something you agree with, and if so how she would be able to go about getting a gyn referral.  Best,  Anson Crofts, PT, DPT  ----- Message ----- From: Rodolph Bong, MD Sent: 11/30/2020  10:55 AM EDT To: Anson Crofts, PT Subject: RE: Speech therapy referral                    I believe I did.   It is not easy to refer to speech therapy at the [place] [facility] location as there is no button for [place] in the referral order form.Clayburn Pert ----- Message ----- From: Anson Crofts, PT Sent: 11/18/2020   9:04 AM EDT To: Rodolph Bong, MD Subject: Speech therapy referral                        Good morning,   In speaking to [facility] during her PT visit yesterday she is still experiencing some cognitive difficulties especially with memory and focus post concussion. I believe she may benefit from speech therapy, which we also have a our [employer] location, to help her in this domain. Would it be possible to send over a Speech therapy referral for her?  Best,  Bailey Rose, PT, DPT

## 2020-12-22 NOTE — Telephone Encounter (Signed)
Referral to OB/GYN placed today. 

## 2020-12-23 ENCOUNTER — Ambulatory Visit: Payer: Medicaid Other | Admitting: Physical Therapy

## 2020-12-23 ENCOUNTER — Ambulatory Visit: Payer: Medicaid Other | Admitting: Speech Pathology

## 2020-12-27 DIAGNOSIS — F321 Major depressive disorder, single episode, moderate: Secondary | ICD-10-CM | POA: Diagnosis not present

## 2020-12-30 ENCOUNTER — Ambulatory Visit: Payer: Medicaid Other | Admitting: Speech Pathology

## 2020-12-30 ENCOUNTER — Ambulatory Visit: Payer: Medicaid Other | Admitting: Physical Therapy

## 2020-12-31 ENCOUNTER — Ambulatory Visit: Payer: Medicaid Other | Admitting: Physical Therapy

## 2021-01-03 ENCOUNTER — Ambulatory Visit: Payer: Medicaid Other | Admitting: Physical Therapy

## 2021-01-03 ENCOUNTER — Ambulatory Visit: Payer: Medicaid Other | Admitting: Speech Pathology

## 2021-01-04 ENCOUNTER — Other Ambulatory Visit: Payer: Self-pay

## 2021-01-04 ENCOUNTER — Ambulatory Visit: Payer: Medicaid Other | Admitting: Physical Therapy

## 2021-01-04 ENCOUNTER — Encounter: Payer: Self-pay | Admitting: Speech Pathology

## 2021-01-04 ENCOUNTER — Ambulatory Visit: Payer: Medicaid Other | Admitting: Speech Pathology

## 2021-01-04 DIAGNOSIS — R41841 Cognitive communication deficit: Secondary | ICD-10-CM

## 2021-01-04 DIAGNOSIS — R293 Abnormal posture: Secondary | ICD-10-CM

## 2021-01-04 DIAGNOSIS — M542 Cervicalgia: Secondary | ICD-10-CM

## 2021-01-04 DIAGNOSIS — R4701 Aphasia: Secondary | ICD-10-CM

## 2021-01-04 DIAGNOSIS — R519 Headache, unspecified: Secondary | ICD-10-CM

## 2021-01-04 NOTE — Therapy (Signed)
[facility] [address] [place], [place], [address] Phone: [phone]   Fax:  [phone]  Speech Language Pathology Treatment  Patient Details  Name: Bailey Rose MRN: [id number] Date of Birth: [birth year] Referring Provider (SLP): Rodolph Bong MD   Encounter Date: 01/04/2021   End of Session - 01/04/21 0818     Visit Number 2    Number of Visits 5    Date for SLP Re-Evaluation 01/15/21    SLP Start Time 0800    SLP Stop Time  0842    SLP Time Calculation (min) 42 min    Activity Tolerance Patient tolerated treatment well             Past Medical History:  Diagnosis Date   Asthma    Migraines    Polycystic ovarian syndrome    Sickle cell trait (HCC)     Past Surgical History:  Procedure Laterality Date   TONSILLECTOMY     WISDOM TOOTH EXTRACTION  2017    There were no vitals filed for this visit.   Subjective Assessment - 01/04/21 0803     Subjective Pt reports trouble remembering school friends, more acquaintances.    Currently in Pain? Yes    Pain Score 5     Pain Location Head    Pain Descriptors / Indicators Throbbing;Pressure    Pain Type Chronic pain    Pain Onset More than a month ago    Pain Relieving Factors pt reports she does not take medication                   ADULT SLP TREATMENT - 01/04/21 1021       General Information   Behavior/Cognition Alert;Cooperative      Treatment Provided   Treatment provided Cognitive-Linquistic      Cognitive-Linquistic Treatment   Treatment focused on Cognition    Skilled Treatment SLP trained patinet in attention strategies. Pt was able to provide examples of how she may be able to implement these strategies at home. Pt to pick 2-3 strategies to begin implementing at home to decrease cognitive fatigue.      Assessment / Recommendations / Plan   Plan Continue with current plan of care      Progression Toward Goals   Progression  toward goals Progressing toward goals                SLP Short Term Goals - 01/04/21 1019       SLP SHORT TERM GOAL #1   Title Pt will recall 3 attention strategies to use to decrease cognitive load during ADLs.    Time 2    Period Weeks    Status On-going      SLP SHORT TERM GOAL #2   Title Pt will recall 3 memory strategies to use to increase recall of important information.    Time 2    Period Weeks    Status On-going      SLP SHORT TERM GOAL #3   Title Pt will identify 3 word finding strategies she can use in conversation.    Time 2    Period Weeks    Status On-going              SLP Long Term Goals - 01/04/21 1020       SLP LONG TERM GOAL #1   Title Pt will report use of attention/memory strategies at home.    Time 4  Period Weeks    Status On-going      SLP LONG TERM GOAL #2   Title Pt will report use of word finding strategies in conversation.    Time 4    Period Weeks    Status On-going              Plan - 01/04/21 0844     Clinical Impression Statement Pt provided examples of deficits in attention at home. SLP edu on strategies to assist with breakdown in ADLs. Pt demonstrated understanding. Cont SLP services to address attention, memory, and occasional word finding deficits.    Speech Therapy Frequency 1x /week    Duration 4 weeks    Treatment/Interventions Compensatory strategies;Cueing hierarchy;Functional tasks;Patient/family education;Environmental controls;Cognitive reorganization;Multimodal communcation approach;Language facilitation;Compensatory techniques;Internal/external aids;SLP instruction and feedback    Potential to Achieve Goals Good    Consulted and Agree with Plan of Care Patient             Patient will benefit from skilled therapeutic intervention in order to improve the following deficits and impairments:   Aphasia    Problem List Patient Active Problem List   Diagnosis Date Noted   Constipation  01/08/2020   Fatigue during pregnancy 01/08/2020   Delayed gastric emptying 01/08/2020   Group beta Strep positive 12/10/2019   Vaginal discharge 04/25/2019   Vaginal odor 04/25/2019   PCOS (polycystic ovarian syndrome) 03/18/2015   Acne cystica 02/10/2015   Dysmenorrhea 06/24/2013    Dorena Bodo MS, [place], CBIS  01/04/2021, 10:22 AM  [facility] [address] [place], [place], [address] Phone: [phone]   Fax:  [phone]   Name: Amye Grego MRN: [id number] Date of Birth: [birth year]

## 2021-01-04 NOTE — Telephone Encounter (Signed)
Patient should not need a referral patient can call wherever she wants to schedule

## 2021-01-04 NOTE — Therapy (Signed)
[place] [place], [place], [address] Phone: [phone]   Fax:  [phone]  Physical Therapy Treatment  Patient Details  Name: Bailey Rose MRN: [id number] Date of Birth: 1995/09/16 Referring Provider (PT): Marikay Alar Date: 01/04/2021   PT End of Session - 01/04/21 0924     Visit Number 10    Number of Visits 17    Date for PT Re-Evaluation 01/17/21    Authorization Type [place] medicaid    PT Start Time 0845    PT Stop Time 0925    PT Time Calculation (min) 40 min             Past Medical History:  Diagnosis Date   Asthma    Migraines    Polycystic ovarian syndrome    Sickle cell trait ([place])     Past Surgical History:  Procedure Laterality Date   TONSILLECTOMY     WISDOM TOOTH EXTRACTION  2017    There were no vitals filed for this visit.   Subjective Assessment - 01/04/21 0810     Subjective pt has been having issues for past couple weeks keeping appts d/t transporation. " I am sleepy, neck is loose" Overall better HA and extreme fatigue associated with cycle- forget to get with GYN    Currently in Pain? Yes    Pain Score 5                                [facility] Adult PT Treatment/Exercise - 01/04/21 0001       Neck Exercises: Machines for Strengthening   UBE (Upper Arm Bike) L 3 3 2mn fwd/3 min backward    Cybex Row 20# x 15    Lat Pull 20# x 15      Neck Exercises: Standing   Neck Retraction 10 reps;3 secs   head on ball   Other Standing Exercises head retracted on ball on wall with 3 # UE ex 10 each- shld flex,abd,chest press,upright row,PNF,punch across    Other Standing Exercises ball vs wall 5 x CW and 5CCW      Modalities   Modalities Traction      Traction   Type of Traction Cervical    Max (lbs) 12    Time 10      Manual Therapy   Manual Therapy Soft tissue mobilization;Manual Traction    Soft tissue mobilization cerv paraspinals,  suboccitals, and UT. gentle pec stretch B    Passive ROM cervical ROM all directions, manual UT stretching. Forward flexion with alot of relief    Manual Traction gentle cervical distraction with flexion                      PT Short Term Goals - 10/22/20 0834       PT SHORT TERM GOAL #1   Title Independent with initial HEP    Status Achieved               PT Long Term Goals - 01/04/21 0900       PT LONG TERM GOAL #1   Title Independent with advanced HEP    Status Achieved      PT LONG TERM GOAL #2   Title Pt report diminished post concussive symptoms as evidenced by score of <3/12 on the RPQ-3 and <20/52 RPQ-12    Status Partially Met  PT LONG TERM GOAL #3   Title Pt will report improved ease and diminished symptoms with driving.    Status Partially Met      PT LONG TERM GOAL #4   Title Pt will report </=1/10 pain in neck with at least 50% less frequent Headaches to facilitate ability to return to prior activities.    Status On-going      PT LONG TERM GOAL #5   Title Pt will report decr back and hip pain with daily activities.    Status On-going                   Plan - 01/04/21 2703     Clinical Impression Statement progressed UE with cerv stab and issued as HEP. added cerv traction as she responded best to manual distractiona nd flexion.    PT Treatment/Interventions ADLs/Self Care Home Management;Cryotherapy;Electrical Stimulation;Iontophoresis 33m/ml Dexamethasone;Moist Heat;Canalith Repostioning;Neuromuscular re-education;Therapeutic exercise;Therapeutic activities;Functional mobility training;Patient/family education;Manual techniques;Energy conservation;Vestibular;Taping;Dry needling    PT Next Visit Plan assess and progress             Patient will benefit from skilled therapeutic intervention in order to improve the following deficits and impairments:  Decreased range of motion, Increased muscle spasms, Dizziness, Decreased  activity tolerance, Hypermobility, Pain, Improper body mechanics, Postural dysfunction, Decreased mobility, Decreased strength, Decreased balance  Visit Diagnosis: Abnormal posture  Intractable headache, unspecified chronicity pattern, unspecified headache type  Cervicalgia     Problem List Patient Active Problem List   Diagnosis Date Noted   Constipation 01/08/2020   Fatigue during pregnancy 01/08/2020   Delayed gastric emptying 01/08/2020   Group beta Strep positive 12/10/2019   Vaginal discharge 04/25/2019   Vaginal odor 04/25/2019   PCOS (polycystic ovarian syndrome) 03/18/2015   Acne cystica 02/10/2015   Dysmenorrhea 06/24/2013    Bailey Rose,ANGIE PTA 01/04/2021, 9:27 AM  [place] [address]Phone: [phone]  Fax:  [phone] Name: Bailey PhilippiMRN: [id number] of Birth: 7April 29, 1997

## 2021-01-04 NOTE — Patient Instructions (Signed)
Access Code: [email or web address] URL: [email or web address] Date: 01/04/2021 Prepared by: Mittie Knittel  Exercises Standing Shoulder Flexion Full Range - 1 x daily - 7 x weekly - 3 sets - 10 reps Standing Shoulder Abduction Full Range - 1 x daily - 7 x weekly - 3 sets - 10 reps Standing Chest Press - 1 x daily - 7 x weekly - 3 sets - 10 reps Standing Upright Shoulder Row with Resistance - 1 x daily - 7 x weekly - 3 sets - 10 reps Standing Bicep Curls with Dumbbells and Rotation - 1 x daily - 7 x weekly - 3 sets - 10 reps Standing Isometric Cervical Retraction with Chin Tucks and Ball at [employer] - 1 x daily - 7 x weekly - 3 sets - 10 reps

## 2021-01-06 ENCOUNTER — Ambulatory Visit: Payer: Medicaid Other | Admitting: Physical Therapy

## 2021-01-06 ENCOUNTER — Ambulatory Visit: Payer: Medicaid Other | Admitting: Speech Pathology

## 2021-01-10 ENCOUNTER — Ambulatory Visit: Payer: Medicaid Other | Admitting: Physical Therapy

## 2021-01-10 ENCOUNTER — Ambulatory Visit: Payer: Medicaid Other | Admitting: Speech Pathology

## 2021-01-10 ENCOUNTER — Encounter: Payer: Self-pay | Admitting: Speech Pathology

## 2021-01-10 ENCOUNTER — Telehealth: Payer: Self-pay | Admitting: Family Medicine

## 2021-01-10 ENCOUNTER — Other Ambulatory Visit: Payer: Self-pay

## 2021-01-10 DIAGNOSIS — R41841 Cognitive communication deficit: Secondary | ICD-10-CM

## 2021-01-10 DIAGNOSIS — R293 Abnormal posture: Secondary | ICD-10-CM | POA: Diagnosis not present

## 2021-01-10 NOTE — Telephone Encounter (Signed)
Patient called stating that when driving, and still having issues with light sensitivity, she is having to wear sunglasses. This is starting to be "too much" and its hard to remember to wear them. She was looking into getting her windows tinted to help with this. She was looking for approval from Dr [name]. I asked if this was for maybe tinting darker than was is legal. She said she didn't know what was allowed at this point but wanted to make sure that she was covered.  Please advise.

## 2021-01-10 NOTE — Therapy (Addendum)
[facility] [address] [place], [place], [address] Phone: [phone]   Fax:  [phone]  Speech Language Pathology Treatment  Patient Details  Name: Maridel Pixler MRN: [id number] Date of Birth: 1995/09/01 Referring Provider (SLP): Rodolph Bong MD   Encounter Date: 01/10/2021   End of Session - 01/10/21 1245     Visit Number 3    Number of Visits 5    Date for SLP Re-Evaluation 01/15/21    SLP Start Time 1230    SLP Stop Time  1312    SLP Time Calculation (min) 42 min    Activity Tolerance Patient tolerated treatment well             Past Medical History:  Diagnosis Date   Asthma    Migraines    Polycystic ovarian syndrome    Sickle cell trait (HCC)     Past Surgical History:  Procedure Laterality Date   TONSILLECTOMY     WISDOM TOOTH EXTRACTION  2017    There were no vitals filed for this visit.   Subjective Assessment - 01/10/21 1239     Subjective Pt reports that she doesn't have a headache today, but has had one throughout the week. She reports this is not due to her previous hx of migraines.    Currently in Pain? No/denies                   ADULT SLP TREATMENT - 01/10/21 1300       General Information   Behavior/Cognition Alert;Cooperative      Treatment Provided   Treatment provided Cognitive-Linquistic      Cognitive-Linquistic Treatment   Treatment focused on Cognition    Skilled Treatment SLP provided pt with visual for cognitive fatigue (spoon theory). SLP and pt developed plans to start implementing attention strategies. Pt was able to provide examples of each strategy and how she plans to implement in her daily life. Began edu on memory strategies.      Assessment / Recommendations / Plan   Plan Continue with current plan of care      Progression Toward Goals   Progression toward goals Progressing toward goals                SLP Short Term Goals - 01/10/21 1305        SLP SHORT TERM GOAL #1   Title Pt will recall 3 attention strategies to use to decrease cognitive load during ADLs.    Time 1    Period Weeks    Status On-going      SLP SHORT TERM GOAL #2   Title Pt will recall 3 memory strategies to use to increase recall of important information.    Time 1    Period Weeks    Status On-going      SLP SHORT TERM GOAL #3   Title Pt will identify 3 word finding strategies she can use in conversation.    Time 1    Period Weeks    Status On-going              SLP Long Term Goals - 01/10/21 1305       SLP LONG TERM GOAL #1   Title Pt will report use of attention/memory strategies at home.    Time 3    Period Weeks    Status On-going      SLP LONG TERM GOAL #2   Title Pt will  report use of word finding strategies in conversation.    Time 3    Period Weeks    Status On-going              Plan - 01/10/21 1246     Clinical Impression Statement Pt reports she has felt a little better, but has had an emotional week with her grandpa passing away. Requested information about whether tinting her vehicle may help with blocking out the light due to her light sensitivity causing headaches. She reported that she would need a doctor's note to get the level of tint for her windows. SLP referred her to speak with PCP or Dr. [name] regarding this request.    Speech Therapy Frequency 1x /week    Duration 4 weeks    Treatment/Interventions Compensatory strategies;Cueing hierarchy;Functional tasks;Patient/family education;Environmental controls;Cognitive reorganization;Multimodal communcation approach;Language facilitation;Compensatory techniques;Internal/external aids;SLP instruction and feedback    Potential to Achieve Goals Good    Consulted and Agree with Plan of Care Patient             Patient will benefit from skilled therapeutic intervention in order to improve the following deficits and impairments:   Cognitive communication  deficit    Problem List Patient Active Problem List   Diagnosis Date Noted   Constipation 01/08/2020   Fatigue during pregnancy 01/08/2020   Delayed gastric emptying 01/08/2020   Group beta Strep positive 12/10/2019   Vaginal discharge 04/25/2019   Vaginal odor 04/25/2019   PCOS (polycystic ovarian syndrome) 03/18/2015   Acne cystica 02/10/2015   Dysmenorrhea 06/24/2013   Speech Therapy Recertification Note  Dates of Reporting Period: 01-15-21 to 02-15-21  Subjective Statement: Pt reports she is motivated to complete speech therapy to address cognitive needs.  Objective: See previous tx notes  Goal Update: Progressing towards all goals.  Plan: Attention and memory strategies  Reason Skilled Services are Required: Pt requires skilled ST services to assist in developing systems to decrease cognitive fatigue and increase attention/memory.    Michelene Gardener [place] MS, [place], CBIS  01/10/2021, 1:11 PM  [facility] [address] [place], [place], [address] Phone: [phone]   Fax:  [phone]   Name: Breeley Bischof MRN: [id number] Date of Birth: Jan 31, 1996

## 2021-01-10 NOTE — Addendum Note (Signed)
Addended by: Dorena Bodo on: 01/10/2021 04:25 PM   Modules accepted: Orders

## 2021-01-11 ENCOUNTER — Ambulatory Visit: Payer: Medicaid Other | Admitting: Physical Therapy

## 2021-01-11 NOTE — Telephone Encounter (Signed)
I think it is going to be very challenging to get darker than legal tint approved with a doctor's note.  Additionally I do not think it safe to have tint on your front facing windshield.  Try doing some research and see what you can find out.  If you can find someone willing to do it and justify it legally I can try writing the doctor's note.  I think you will have a much easier time buying a lot of cheap sunglasses and leaving them everywhere especially in your car so they are always available.  Clayburn Pert

## 2021-01-12 ENCOUNTER — Encounter: Payer: Self-pay | Admitting: Physical Therapy

## 2021-01-12 ENCOUNTER — Ambulatory Visit: Payer: Medicaid Other | Admitting: Physical Therapy

## 2021-01-12 ENCOUNTER — Other Ambulatory Visit: Payer: Self-pay

## 2021-01-12 DIAGNOSIS — M25552 Pain in left hip: Secondary | ICD-10-CM

## 2021-01-12 DIAGNOSIS — R2681 Unsteadiness on feet: Secondary | ICD-10-CM

## 2021-01-12 DIAGNOSIS — R293 Abnormal posture: Secondary | ICD-10-CM | POA: Diagnosis not present

## 2021-01-12 DIAGNOSIS — M545 Low back pain, unspecified: Secondary | ICD-10-CM

## 2021-01-12 DIAGNOSIS — M542 Cervicalgia: Secondary | ICD-10-CM
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
## 2021-01-13 NOTE — Telephone Encounter (Signed)
Returned pt's call and left detailed message on VM w/ Dr. Zollie Pee advice.

## 2021-01-13 NOTE — Telephone Encounter (Signed)
Patient called back. She said that she found someone that is able to do the tint (it would be 10% darker than the legal amount) and she would need a medical exemption form completed for the [facility] by Dr [name]. Form has been placed in Dr [employer] box.  Please advise.

## 2021-01-19 NOTE — Telephone Encounter (Signed)
Form completed.  You have to fill out the first part of the form.  Form is ready to be picked up at the front desk.

## 2021-01-19 NOTE — Telephone Encounter (Signed)
Left message informing patient. Form is at front desk in the cabinet.

## 2021-01-21 ENCOUNTER — Ambulatory Visit: Payer: Medicaid Other | Admitting: Physical Therapy

## 2021-01-21 ENCOUNTER — Ambulatory Visit: Payer: Medicaid Other | Admitting: Speech Pathology

## 2021-01-24 ENCOUNTER — Encounter: Payer: Self-pay | Admitting: Physical Therapy

## 2021-01-24 ENCOUNTER — Ambulatory Visit: Payer: Medicaid Other | Attending: Family Medicine | Admitting: Physical Therapy

## 2021-01-24 ENCOUNTER — Other Ambulatory Visit: Payer: Self-pay

## 2021-01-24 DIAGNOSIS — R519 Headache, unspecified: Secondary | ICD-10-CM | POA: Diagnosis present

## 2021-01-24 DIAGNOSIS — M25552 Pain in left hip: Secondary | ICD-10-CM | POA: Diagnosis present

## 2021-01-24 DIAGNOSIS — R293 Abnormal posture: Secondary | ICD-10-CM

## 2021-01-24 DIAGNOSIS — M542 Cervicalgia: Secondary | ICD-10-CM | POA: Diagnosis present

## 2021-01-24 DIAGNOSIS — R41841 Cognitive communication deficit: Secondary | ICD-10-CM | POA: Diagnosis present

## 2021-01-24 DIAGNOSIS — M545 Low back pain, unspecified: Secondary | ICD-10-CM

## 2021-01-24 DIAGNOSIS — R2681 Unsteadiness on feet: Secondary | ICD-10-CM | POA: Diagnosis not present

## 2021-01-24 DIAGNOSIS — R42 Dizziness and giddiness: Secondary | ICD-10-CM | POA: Diagnosis present

## 2021-01-24 DIAGNOSIS — M25551 Pain in right hip: Secondary | ICD-10-CM | POA: Diagnosis present

## 2021-01-24 NOTE — Therapy (Signed)
[facility] [address] [place], [place], [address] Phone: [phone]   Fax:  [phone]  Physical Therapy Treatment  Patient Details  Name: Bailey Rose MRN: [id number] Date of Birth: 07/07/1996 Referring Provider (PT): Corey   Encounter Date: 01/24/2021   PT End of Session - 01/24/21 1210     Visit Number 12    Date for PT Re-Evaluation 02/24/21    PT Start Time 1015    PT Stop Time 1100    PT Time Calculation (min) 45 min    Activity Tolerance Patient tolerated treatment well    Behavior During Therapy WFL for tasks assessed/performed             Past Medical History:  Diagnosis Date   Asthma    Migraines    Polycystic ovarian syndrome    Sickle cell trait (HCC)     Past Surgical History:  Procedure Laterality Date   TONSILLECTOMY     WISDOM TOOTH EXTRACTION  2017    There were no vitals filed for this visit.   Subjective Assessment - 01/24/21 1153     Subjective Reports overall doing better, still tight and tender in the left upper trap area, reports issues with menstual cycle as far as pain and some dizziness at times    Currently in Pain? Yes    Pain Score 3     Pain Location Neck    Aggravating Factors  cycle, bright lights                               [facility] Adult PT Treatment/Exercise - 01/24/21 0001       Neck Exercises: Machines for Strengthening   UBE (Upper Arm Bike) L 3  2min fwd/2 min backward    Cybex Row 20# x 2x10    Lat Pull 20# 2x10    Other Machines for Strengthening pulleys shoulder  ext 10# 2x10      Neck Exercises: Standing   Other Standing Exercises 2# W backs, weighted ball overhead lift, 6# shrugs with upper trap and levaotr stretches      Neck Exercises: Seated   Other Seated Exercise 5# bent over row, 2# reverse flies and 2# bent over extension      Manual Therapy   Manual Therapy Soft tissue mobilization;Manual Traction    Soft tissue  mobilization cerv paraspinals, suboccitals, and UT. gentle pec stretch B    Passive ROM cervical ROM all directions, manual UT stretching. Forward flexion with alot of relief    Manual Traction gentle cervical distraction with flexion                      PT Short Term Goals - 10/22/20 0834       PT SHORT TERM GOAL #1   Title Independent with initial HEP    Status Achieved               PT Long Term Goals - 01/24/21 1253       PT LONG TERM GOAL #1   Title Independent with advanced HEP      PT LONG TERM GOAL #2   Title Pt report diminished post concussive symptoms as evidenced by score of <3/12 on the RPQ-3 and <20/52 RPQ-12    Status Partially Met      PT LONG TERM GOAL #3   Title Pt will report improved   ease and diminished symptoms with driving.    Status Partially Met      PT LONG TERM GOAL #4   Title Pt will report </=1/10 pain in neck with at least 50% less frequent Headaches to facilitate ability to return to prior activities.    Status Partially Met                   Plan - 01/24/21 1245     Clinical Impression Statement Overall patient is doing much better, still with tightness and spasms in the left upper trap and the neck area, reports that with her cycle she will have more issues with pain and some dizziness, reports that she has some issues at times iwht her eyes with bright lights or outside.  She reports times with trying to remember the HEP at home, I asked her about gym and we may need to use her phone for pictures to help with this    PT Frequency 1x / week    PT Duration 8 weeks    PT Treatment/Interventions ADLs/Self Care Home Management;Cryotherapy;Electrical Stimulation;Iontophoresis 4mg/ml Dexamethasone;Moist Heat;Canalith Repostioning;Neuromuscular re-education;Therapeutic exercise;Therapeutic activities;Functional mobility training;Patient/family education;Manual techniques;Energy conservation;Vestibular;Taping;Dry needling     Recommended Other Services try to see if we can get her independent with HEP or gym program    Consulted and Agree with Plan of Care Patient             Patient will benefit from skilled therapeutic intervention in order to improve the following deficits and impairments:  Decreased range of motion, Increased muscle spasms, Dizziness, Decreased activity tolerance, Hypermobility, Pain, Improper body mechanics, Postural dysfunction, Decreased mobility, Decreased strength, Decreased balance  Visit Diagnosis: Unsteadiness on feet - Plan: PT plan of care cert/re-cert  Pain in left hip - Plan: PT plan of care cert/re-cert  Low back pain, unspecified back pain laterality, unspecified chronicity, unspecified whether sciatica present - Plan: PT plan of care cert/re-cert  Cervicalgia - Plan: PT plan of care cert/re-cert  Abnormal posture - Plan: PT plan of care cert/re-cert  Intractable headache, unspecified chronicity pattern, unspecified headache type - Plan: PT plan of care cert/re-cert  Pain in right hip - Plan: PT plan of care cert/re-cert     Problem List Patient Active Problem List   Diagnosis Date Noted   Constipation 01/08/2020   Fatigue during pregnancy 01/08/2020   Delayed gastric emptying 01/08/2020   Group beta Strep positive 12/10/2019   Vaginal discharge 04/25/2019   Vaginal odor 04/25/2019   PCOS (polycystic ovarian syndrome) 03/18/2015   Acne cystica 02/10/2015   Dysmenorrhea 06/24/2013    ALBRIGHT,MICHAEL W., PT 01/24/2021, 12:55 PM  [facility] [address] [place], [place], [address] Phone: [phone]   Fax:  [phone]  Name: Bailey Rose MRN: [id number] Date of Birth: 12/08/1995    

## 2021-01-28 ENCOUNTER — Ambulatory Visit (INDEPENDENT_AMBULATORY_CARE_PROVIDER_SITE_OTHER): Payer: Medicaid Other

## 2021-01-28 ENCOUNTER — Other Ambulatory Visit: Payer: Self-pay

## 2021-01-28 ENCOUNTER — Other Ambulatory Visit (HOSPITAL_COMMUNITY)
Admission: RE | Admit: 2021-01-28 | Discharge: 2021-01-28 | Disposition: A | Discharge status: Home / Self Care | Payer: Medicaid Other | Source: Ambulatory Visit

## 2021-01-28 VITALS — BP 115/74 | HR 74 | Ht 59.0 in | Wt 166.0 lb

## 2021-01-28 DIAGNOSIS — G8929 Other chronic pain: Secondary | ICD-10-CM | POA: Diagnosis not present

## 2021-01-28 DIAGNOSIS — R102 Pelvic and perineal pain: Secondary | ICD-10-CM | POA: Diagnosis not present

## 2021-01-28 DIAGNOSIS — R1030 Lower abdominal pain, unspecified: Secondary | ICD-10-CM | POA: Diagnosis not present

## 2021-01-28 DIAGNOSIS — Z113 Encounter for screening for infections with a predominantly sexual mode of transmission: Secondary | ICD-10-CM | POA: Insufficient documentation

## 2021-01-28 LAB — POCT URINALYSIS DIPSTICK
Bilirubin, UA: NEGATIVE
Blood, UA: NEGATIVE
Glucose, UA: NEGATIVE
Ketones, UA: NEGATIVE
Nitrite, UA: NEGATIVE
Protein, UA: POSITIVE — AB
Spec Grav, UA: 1.015 (ref 1.010–1.025)
Urobilinogen, UA: 0.2 E.U./dL
pH, UA: 7.5 (ref 5.0–8.0)

## 2021-01-28 NOTE — Progress Notes (Signed)
GYN patient presents for problem visit today.  CC: Pelvic Pain on/ff for 5 months. With walking pain is a 9/10 with sitting pain is 2/10. Denies any irregular bleeding.  Pt notes before cycle she has fatigue, and shooting pain on her left side.  Cycles are heavy and painful. Notes pain w/intercourse.  Pt notes Hx of PCOS. Has not had recent U/S.   LMP:12/27/20
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
## 2021-01-30 LAB — URINE CULTURE

## 2021-01-31 LAB — CERVICOVAGINAL ANCILLARY ONLY
Bacterial Vaginitis (gardnerella): POSITIVE — AB
Candida Glabrata: NEGATIVE
Candida Vaginitis: NEGATIVE
Chlamydia: NEGATIVE
Comment: NEGATIVE
Comment: NEGATIVE
Comment: NEGATIVE
Comment: NEGATIVE
Comment: NEGATIVE
Comment: NORMAL
Neisseria Gonorrhea: NEGATIVE
Trichomonas: NEGATIVE

## 2021-02-01 MED ORDER — METRONIDAZOLE 500 MG PO TABS: 500.0000 mg | ORAL_TABLET | Freq: Two times a day (BID) | ORAL | 0 refills | Status: DC

## 2021-02-01 NOTE — Addendum Note (Signed)
Addended by: Gerrit Heck L on: 02/01/2021 05:54 PM   Modules accepted: Orders

## 2021-02-03 ENCOUNTER — Ambulatory Visit (HOSPITAL_COMMUNITY)
Admission: RE | Admit: 2021-02-03 | Discharge: 2021-02-03 | Disposition: A | Discharge status: Home / Self Care | Payer: Medicaid Other | Source: Ambulatory Visit

## 2021-02-03 ENCOUNTER — Other Ambulatory Visit: Payer: Self-pay

## 2021-02-03 DIAGNOSIS — N83201 Unspecified ovarian cyst, right side: Secondary | ICD-10-CM | POA: Diagnosis not present

## 2021-02-03 DIAGNOSIS — R102 Pelvic and perineal pain: Secondary | ICD-10-CM | POA: Diagnosis present

## 2021-02-04 ENCOUNTER — Ambulatory Visit: Payer: Medicaid Other | Admitting: Physical Therapy

## 2021-02-04 ENCOUNTER — Encounter: Payer: Self-pay | Admitting: Physical Therapy

## 2021-02-04 DIAGNOSIS — R42 Dizziness and giddiness: Secondary | ICD-10-CM

## 2021-02-04 DIAGNOSIS — M25551 Pain in right hip: Secondary | ICD-10-CM

## 2021-02-04 DIAGNOSIS — R293 Abnormal posture: Secondary | ICD-10-CM

## 2021-02-04 DIAGNOSIS — R519 Headache, unspecified: Secondary | ICD-10-CM

## 2021-02-04 DIAGNOSIS — R2681 Unsteadiness on feet: Secondary | ICD-10-CM | POA: Diagnosis not present

## 2021-02-04 DIAGNOSIS — M545 Low back pain, unspecified: Secondary | ICD-10-CM

## 2021-02-04 DIAGNOSIS — M25552 Pain in left hip: Secondary | ICD-10-CM

## 2021-02-04 DIAGNOSIS — M542 Cervicalgia: Secondary | ICD-10-CM

## 2021-02-04 NOTE — Therapy (Signed)
[facility] [address] [place], [place], [address] Phone: [phone]   Fax:  [phone]  Physical Therapy Treatment  Patient Details  Name: Bailey Rose MRN: [id number] Date of Birth: 1995/12/02 Referring Provider (PT): Cheron Every Date: 02/04/2021   PT End of Session - 02/04/21 1103     Visit Number 13    Number of Visits 17    Date for PT Re-Evaluation 02/24/21    Authorization Type [place] medicaid    PT Start Time 1014    PT Stop Time 1100    PT Time Calculation (min) 46 min    Activity Tolerance Patient tolerated treatment well    Behavior During Therapy [facility] for tasks assessed/performed             Past Medical History:  Diagnosis Date   Asthma    Migraines    Polycystic ovarian syndrome    Sickle cell trait (HCC)     Past Surgical History:  Procedure Laterality Date   TONSILLECTOMY     WISDOM TOOTH EXTRACTION  2017    There were no vitals filed for this visit.   Subjective Assessment - 02/04/21 1015     Subjective Having a little more right posterior head pain, some sciatic type pain, left low back pain    Currently in Pain? Yes    Pain Score 2     Pain Location Neck   left low back   Pain Descriptors / Indicators Sore    Aggravating Factors  my cycle, brightnmess, loudness at time                               [facility] Adult PT Treatment/Exercise - 02/04/21 0001       Neck Exercises: Machines for Strengthening   UBE (Upper Arm Bike) L 3  fwd/2 min backward    Cybex Row 20# x 2x10    Lat Pull 20# 2x10    Other Machines for Strengthening pulleys shoulder  ext 10# 2x10      Neck Exercises: Standing   Other Standing Exercises 2# W backs, weighted ball overhead lift, 6# shrugs with upper trap and levaotr stretches      Lumbar Exercises: Stretches   Passive Hamstring Stretch Right;Left;3 reps;20 seconds    Piriformis Stretch Right;Left;3 reps;20 seconds       Manual Therapy   Soft tissue mobilization cerv paraspinals, suboccitals, and UT. gentle pec stretch B    Passive ROM cervical ROM all directions, manual UT stretching. Forward flexion with alot of relief    Manual Traction gentle cervical distraction with flexion              Trigger Point Dry Needling - 02/04/21 0001     Consent Given? Yes    Upper Trapezius Response Twitch reponse elicited;Palpable increased muscle length                    PT Short Term Goals - 10/22/20 0834       PT SHORT TERM GOAL #1   Title Independent with initial HEP    Status Achieved               PT Long Term Goals - 02/04/21 1106       PT LONG TERM GOAL #1   Title Independent with advanced HEP    Status Achieved  PT LONG TERM GOAL #3   Title Pt will report improved ease and diminished symptoms with driving.    Status Achieved                   Plan - 02/04/21 1104     Clinical Impression Statement Issued and went over a more advanced HEP, gave her different ways to do some of the exercises. she is very tight in the HS and the piriformis mms.  She reports going to the gym, she also reports that some testing showed cysts on ovaries, she reports feeling down about this.    PT Next Visit Plan continue to try to get her back to her normal    Consulted and Agree with Plan of Care Patient             Patient will benefit from skilled therapeutic intervention in order to improve the following deficits and impairments:  Decreased range of motion, Increased muscle spasms, Dizziness, Decreased activity tolerance, Hypermobility, Pain, Improper body mechanics, Postural dysfunction, Decreased mobility, Decreased strength, Decreased balance  Visit Diagnosis: Unsteadiness on feet  Dizziness and giddiness  Pain in left hip  Low back pain, unspecified back pain laterality, unspecified chronicity, unspecified whether sciatica present  Cervicalgia  Abnormal  posture  Intractable headache, unspecified chronicity pattern, unspecified headache type  Pain in right hip     Problem List Patient Active Problem List   Diagnosis Date Noted   Constipation 01/08/2020   Delayed gastric emptying 01/08/2020   PCOS (polycystic ovarian syndrome) 03/18/2015   Acne cystica 02/10/2015   Dysmenorrhea 06/24/2013    Jearld Lesch., PT 02/04/2021, 11:07 AM  [facility] [address] [place], [place], [address] Phone: [phone]   Fax:  [phone]  Name: Bailey Rose MRN: [id number] Date of Birth: 11/09/1995

## 2021-02-04 NOTE — Patient Instructions (Signed)
Access Code: [email or web address] URL: [email or web address] Date: 02/04/2021 Prepared by: Stacie Glaze  Exercises Supine Hamstring Stretch with Strap - 1 x daily - 7 x weekly - 3 sets - 10 reps - 30 hold Supine Hamstring Stretch with Doorway - 1 x daily - 7 x weekly - 3 sets - 10 reps - 30 hold Seated Hamstring Stretch with Chair - 1 x daily - 7 x weekly - 3 sets - 10 reps - 30 hold Supine Piriformis Stretch Pulling Heel to Hip - 1 x daily - 7 x weekly - 3 sets - 10 reps - 30 hold Seated Bent Over Shoulder Row with Dumbbells - 1 x daily - 7 x weekly - 3 sets - 10 reps - 30 hold Seated Shoulder Extension with Dumbbells - 1 x daily - 7 x weekly - 3 sets - 10 reps - 30 hold Seated Shoulder Horizontal Abduction and Adduction with Dumbbells - 1 x daily - 7 x weekly - 3 sets - 10 reps - 30 hold Single Arm Bent Over Shoulder Horizontal Abduction with Dumbbell - Thumb Up - 1 x daily - 7 x weekly - 3 sets - 10 reps - 30 hold Prone Middle Trapezius with Legs Straight and Dumbbells on Swiss Ball - 1 x daily - 7 x weekly - 3 sets - 10 reps - 30 hold

## 2021-02-08 ENCOUNTER — Encounter: Payer: Self-pay | Admitting: Speech Pathology

## 2021-02-08 ENCOUNTER — Other Ambulatory Visit: Payer: Self-pay

## 2021-02-08 ENCOUNTER — Ambulatory Visit: Payer: Medicaid Other | Admitting: Physical Therapy

## 2021-02-08 ENCOUNTER — Encounter: Payer: Self-pay | Admitting: Physical Therapy

## 2021-02-08 ENCOUNTER — Encounter: Payer: Medicaid Other | Admitting: Speech Pathology

## 2021-02-08 ENCOUNTER — Ambulatory Visit: Payer: Medicaid Other | Admitting: Speech Pathology

## 2021-02-08 DIAGNOSIS — R41841 Cognitive communication deficit: Secondary | ICD-10-CM

## 2021-02-08 DIAGNOSIS — M25552 Pain in left hip: Secondary | ICD-10-CM

## 2021-02-08 DIAGNOSIS — R2681 Unsteadiness on feet: Secondary | ICD-10-CM

## 2021-02-08 DIAGNOSIS — M542 Cervicalgia: Secondary | ICD-10-CM

## 2021-02-08 DIAGNOSIS — M545 Low back pain, unspecified: Secondary | ICD-10-CM
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
## 2021-02-08 NOTE — Therapy (Signed)
[place]. [place], [place], [address] Phone: [phone]   Fax:  [phone]  Speech Language Pathology Treatment & Discharge Summary  Patient Details  Name: Bailey Rose MRN: [id number] Date of Birth: 1996-06-24 Referring Provider (SLP): Gregor Hams MD   Encounter Date: 02/08/2021   End of Session - 02/08/21 0804     Visit Number 4    Number of Visits 5    Date for SLP Re-Evaluation 02/15/21    Authorization Time Period 01/15/21 - 02/15/21    Authorization - Number of Visits 3    SLP Start Time 0800    SLP Stop Time  0840    SLP Time Calculation (min) 40 min    Activity Tolerance Patient tolerated treatment well             Past Medical History:  Diagnosis Date   Asthma    Migraines    Polycystic ovarian syndrome    Sickle cell trait ([place])     Past Surgical History:  Procedure Laterality Date   TONSILLECTOMY     WISDOM TOOTH EXTRACTION  2017    There were no vitals filed for this visit.   Subjective Assessment - 02/08/21 0803     Subjective Pt reports she has a headache today and is currently fighting an infection.    Currently in Pain? Yes    Pain Score 6     Pain Location Head    Pain Descriptors / Indicators Headache    Pain Onset Yesterday    Pain Relieving Factors no medication                   ADULT SLP TREATMENT - 02/08/21 0842       General Information   Behavior/Cognition Cooperative;Lethargic      Treatment Provided   Treatment provided Cognitive-Linquistic      Cognitive-Linquistic Treatment   Treatment focused on Cognition    Skilled Treatment Finished edu re: cognitive fatigue strategies and the importance of continuing therapeutic strategies. Pt in agreement with d/c. All edu has been completed at this time.      Assessment / Recommendations / Plan   Plan Continue with current plan of care      Progression Toward Goals   Progression toward goals  Progressing toward goals                SLP Short Term Goals - 02/08/21 1115       SLP SHORT TERM GOAL #1   Title Pt will recall 3 attention strategies to use to decrease cognitive load during ADLs.    Time 1    Period Weeks    Status Achieved      SLP SHORT TERM GOAL #2   Title Pt will recall 3 memory strategies to use to increase recall of important information.    Time 1    Period Weeks    Status Achieved      SLP SHORT TERM GOAL #3   Title Pt will identify 3 word finding strategies she can use in conversation.    Time 1    Period Weeks    Status Achieved              SLP Long Term Goals - 02/08/21 5208       SLP LONG TERM GOAL #1   Title Pt will report use of attention/memory strategies at home.    Time 3  Period Weeks    Status Achieved      SLP LONG TERM GOAL #2   Title Pt will report use of word finding strategies in conversation.    Time 3    Period Weeks    Status Achieved              Plan - 02/08/21 0805     Clinical Impression Statement Pt reports she feels her memory has been better overall, but continues to have intermittent difficulties. Reports intermittent difficulties with speech this weekend where she added"letters to words". Pt reported increased fatigue and SLP explained that fatigue can cause some word finding difficulties. 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 She is interested in going back to school to be a Biomedical scientist. She feels like she has been good handling the stress and has been going to therapy.    Speech Therapy Frequency 1x /week    Duration 4 weeks    Treatment/Interventions Compensatory strategies;Cueing hierarchy;Functional tasks;Patient/family education;Environmental controls;Cognitive reorganization;Multimodal communcation approach;Language facilitation;Compensatory  techniques;Internal/external aids;SLP instruction and feedback    Potential to Achieve Goals Good    Consulted and Agree with Plan of Care Patient             Patient will benefit from skilled therapeutic intervention in order to improve the following deficits and impairments:   Cognitive communication deficit    Problem List Patient Active Problem List   Diagnosis Date Noted   Constipation 01/08/2020   Delayed gastric emptying 01/08/2020   PCOS (polycystic ovarian syndrome) 03/18/2015   Acne cystica 02/10/2015   Dysmenorrhea 06/24/2013   SPEECH THERAPY DISCHARGE SUMMARY  Visits from Start of Care: 4  Current functional level related to goals / functional outcomes: Met all goals; continues to exhibit cognitive fatigue   Remaining deficits: Increased cognitive fatigue   Education / Equipment: Provided edu on attention strategies, memory strategies, organizational strategies, cognitive fatigue strategies, word finding strategies  Patient agrees to discharge. Patient goals were met. Patient is being discharged due to meeting the stated rehab goals.Verdene Lennert MS, [place], CBIS  02/08/2021, 3:37 PM  [place]. [place], [place], [address] Phone: [phone]   Fax:  [phone]   Name: Bailey Rose MRN: [id number] Date of Birth: 13-Aug-1995

## 2021-02-18 ENCOUNTER — Other Ambulatory Visit: Payer: Self-pay

## 2021-02-18 ENCOUNTER — Ambulatory Visit (INDEPENDENT_AMBULATORY_CARE_PROVIDER_SITE_OTHER): Payer: Medicaid Other | Admitting: Family Medicine

## 2021-02-18 VITALS — BP 110/78 | HR 78 | Ht 59.0 in | Wt 167.6 lb

## 2021-02-18 DIAGNOSIS — G8929 Other chronic pain: Secondary | ICD-10-CM

## 2021-02-18 DIAGNOSIS — F0781 Postconcussional syndrome: Secondary | ICD-10-CM | POA: Diagnosis not present

## 2021-02-18 DIAGNOSIS — R519 Headache, unspecified: Secondary | ICD-10-CM

## 2021-02-18 MED ORDER — METRONIDAZOLE 500 MG PO TABS: 500.0000 mg | ORAL_TABLET | Freq: Two times a day (BID) | ORAL | 0 refills | Status: DC

## 2021-02-18 MED ORDER — ALBUTEROL SULFATE HFA 108 (90 BASE) MCG/ACT IN AERS: 1.0000 | INHALATION_SPRAY | Freq: Four times a day (QID) | RESPIRATORY_TRACT | 0 refills | Status: DC | PRN
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
## 2021-02-18 NOTE — Patient Instructions (Addendum)
Thank you for coming in today.   The medicines we talked about are Nortriptyline and Amitriptyline.  I would use nortriptyline first because I think it has less side effects.   I could also do a brain MRI which could help reassure me that we are not missing something.   You should hear from MRI scheduling within 1 week. If you do not hear please let me know.

## 2021-02-21 ENCOUNTER — Encounter: Payer: Self-pay | Admitting: Physical Therapy

## 2021-02-21 ENCOUNTER — Ambulatory Visit: Payer: Medicaid Other | Admitting: Physical Therapy

## 2021-02-21 ENCOUNTER — Other Ambulatory Visit: Payer: Self-pay

## 2021-02-21 DIAGNOSIS — R279 Unspecified lack of coordination: Secondary | ICD-10-CM | POA: Diagnosis present

## 2021-02-21 DIAGNOSIS — M25552 Pain in left hip: Secondary | ICD-10-CM | POA: Diagnosis present

## 2021-02-21 DIAGNOSIS — M6281 Muscle weakness (generalized): Secondary | ICD-10-CM

## 2021-02-21 NOTE — Patient Instructions (Signed)
Access Code: [email or web address] URL: [email or web address] Date: 02/21/2021 Prepared by: [facility]  Exercises Supine Diaphragmatic Breathing - 1 x daily - 7 x weekly - 1 sets - 10 reps Seated Hamstring Stretch - 1 x daily - 7 x weekly - 1 sets - 2-5 reps - 45s hold Supine Hip Adductor Stretch - 1 x daily - 7 x weekly - 1 sets - 2-5 reps - 45 hold Supine Pelvic Floor Stretch - 1 x daily - 7 x weekly - 1 sets - 2-5 reps - 45 hold Diaphragmatic Breathing in Child's Pose with Pelvic Floor Relaxation - 1 x daily - 7 x weekly - 1 sets - 2-5 reps - 45s hold Modified Thomas Stretch - 1 x daily - 7 x weekly - 1 sets - 2-5 reps - 45s hold Hooklying Transversus Abdominis Palpation - 1 x daily - 7 x weekly - 1 sets - 10 reps - 2-3s holds  [facility] [address], [address] [place], [place] [address] Phone # [phone] Fax [phone]
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
## 2021-02-22 ENCOUNTER — Encounter: Payer: Self-pay | Admitting: Physical Therapy

## 2021-02-22 ENCOUNTER — Ambulatory Visit: Payer: Medicaid Other | Attending: Family Medicine | Admitting: Physical Therapy

## 2021-02-22 DIAGNOSIS — M6281 Muscle weakness (generalized): Secondary | ICD-10-CM | POA: Insufficient documentation

## 2021-02-22 DIAGNOSIS — M542 Cervicalgia: Secondary | ICD-10-CM | POA: Diagnosis present
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
## 2021-02-24 ENCOUNTER — Ambulatory Visit (INDEPENDENT_AMBULATORY_CARE_PROVIDER_SITE_OTHER): Payer: Medicaid Other

## 2021-02-24 ENCOUNTER — Other Ambulatory Visit: Payer: Self-pay

## 2021-02-24 ENCOUNTER — Encounter: Payer: Self-pay | Admitting: Neurology

## 2021-02-24 VITALS — BP 109/67 | HR 70 | Ht 60.25 in | Wt 165.6 lb

## 2021-02-24 DIAGNOSIS — N83201 Unspecified ovarian cyst, right side: Secondary | ICD-10-CM

## 2021-02-24 DIAGNOSIS — Z712 Person consulting for explanation of examination or test findings: Secondary | ICD-10-CM | POA: Diagnosis not present

## 2021-02-24 DIAGNOSIS — Z113 Encounter for screening for infections with a predominantly sexual mode of transmission: Secondary | ICD-10-CM

## 2021-02-24 NOTE — Progress Notes (Signed)
Here for F/U US. Reports concerns about cyst on ovary.  Still going to PT for groin pain. Continues to report painl.  States she was supposed to get blood work drawn last visit but declined. Wants blood work today.

## 2021-02-24 NOTE — Progress Notes (Signed)
GYNECOLOGY FOLLOW UP OFFICE VISIT NOTE  History:  Bailey Rose is a 25 y.o. G3P0020 here today for follow up.  She states she has some confusion regarding her test results and would like clarification.  Patient also requests full (blood) STD testing. She reports she has started PT for her pain.    Past Medical History:  Diagnosis Date   Asthma    Migraines    Polycystic ovarian syndrome    Sickle cell trait (HCC)     Past Surgical History:  Procedure Laterality Date   TONSILLECTOMY     WISDOM TOOTH EXTRACTION  2017    The following portions of the patient's history were reviewed and updated as appropriate: allergies, current medications, past family history, past medical history, past social history, past surgical history and problem list.   Health Maintenance:  Normal pap and negative HRHPV on May 2021.  No mammogram d/t age.   Review of Systems:  Genito-Urinary ROS: no dysuria, trouble voiding, or hematuria Gastrointestinal ROS: no abdominal pain, change in bowel habits, or black or bloody stools Objective:  Vitals: BP 109/67   Pulse 70   Ht 5' 0.25" (1.53 m)   Wt 165 lb 9.6 oz (75.1 kg)   LMP 02/12/2021 Comment: Reports 2 periods in July  BMI 32.07 kg/m   Physical Exam: OBGyn Exam   Labs and Imaging: US PELVIC COMPLETE WITH TRANSVAGINAL  Result Date: 02/03/2021 CLINICAL DATA:  Pelvic pain, LMP 01/30/2021 EXAM: TRANSABDOMINAL AND TRANSVAGINAL ULTRASOUND OF PELVIS TECHNIQUE: Both transabdominal and transvaginal ultrasound examinations of the pelvis were performed. Transabdominal technique was performed for global imaging of the pelvis including uterus, ovaries, adnexal regions, and pelvic cul-de-sac. It was necessary to proceed with endovaginal exam following the transabdominal exam to visualize the endometrium, LEFT ovary, and to characterize a cystic lesion in the RIGHT adnexa. COMPARISON:  Obstetrical ultrasound 12/19/2019 FINDINGS: Uterus Measurements: 6.8 x 3.2  x 4.9 cm = volume: 56 mL. Anteverted. Normal morphology without mass Endometrium Thickness: 2 mm.  No endometrial fluid or focal abnormalities Right ovary Measurements: 5.4 x 2.7 x 2.0 cm = volume: 14.9 mL. Cystic structure identified adjacent to RIGHT ovary, 6.0 x 3.7 x 5.8 cm, containing several tiny internal peripheral daughter cysts, question cumulus oophorus or less likely mildly complicated cyst. No mural nodularity. Left ovary Measurements: 2.7 x 1.3 x 1.7 cm = volume: 3.1 mL. Normal morphology without mass Other findings No free pelvic fluid.  No additional adnexal masses. IMPRESSION: Unremarkable uterus, endometrial complex and LEFT ovary. 6.0 cm diameter minimally complicated cyst with several internal daughter cysts/peripheral septations, potentially representing a cumulus oophorus; Recommend follow-up US in 3-6 months. Note: This recommendation does not apply to premenarchal patients or to those with increased risk (genetic, family history, elevated tumor markers or other high-risk factors) of ovarian cancer. Reference: Radiology 2019 Nov; 293(2):359-371. Electronically Signed   By: Ulyses Southward M.D.   On: 02/03/2021 17:16    Assessment & Plan:  25 year old Follow Up to Discuss Results STD Testing Right Ovarian Cyst   -Results reviewed with Dr. Karolee Ohs who advised: *Repeat US in 3-6 months. *Collect CA125 lab. -Provider reviews [place] results with patient.  Informed of findings and POC to include: *Discussed how findings are more c/w menstruation/ovulation than complication.  *MD and radiology recommendation to repeat US in 3 months. Order placed. *MD recommendation for collection of CA125. Discussed what it means and what it detects. *Informed that provider has signed off on PT recommendation/goals.  Patient should continue until symptoms improve or until recommended. -Patient verbalizes understanding and without current questions or concerns. -Encouraged to continue to be own advocate  regarding healthcare. -Will order STD testing per request. -Will await results. -RTO in one year or prn.   Total face-to-face time with patient:  20  minutes I spent an additional 6 minutes consulting and reviewing results. Total time 26 minutes  Gerrit Heck, [place] 02/24/2021 4:22 PM

## 2021-02-25 LAB — RPR+HBSAG+HCVAB+...
HIV Screen 4th Generation wRfx: NONREACTIVE
Hep C Virus Ab: <0.1 s/co ratio (ref 0.0–0.9)
Hepatitis B Surface Ag: NEGATIVE
RPR Ser Ql: NONREACTIVE

## 2021-02-25 LAB — CA 125: Cancer Antigen (CA) 125: 7.1 U/mL (ref 0.0–38.1)

## 2021-03-01 ENCOUNTER — Ambulatory Visit
Admission: RE | Admit: 2021-03-01 | Discharge: 2021-03-01 | Disposition: A | Discharge status: Home / Self Care | Payer: Medicaid Other | Source: Ambulatory Visit | Attending: Family Medicine | Admitting: Family Medicine

## 2021-03-01 DIAGNOSIS — R519 Headache, unspecified: Secondary | ICD-10-CM

## 2021-03-01 DIAGNOSIS — G8929 Other chronic pain: Secondary | ICD-10-CM

## 2021-03-02 NOTE — Progress Notes (Signed)
MRI of the brain is normal appearing

## 2021-03-03 ENCOUNTER — Other Ambulatory Visit: Payer: Self-pay

## 2021-03-03 ENCOUNTER — Ambulatory Visit (HOSPITAL_BASED_OUTPATIENT_CLINIC_OR_DEPARTMENT_OTHER)
Admission: RE | Admit: 2021-03-03 | Discharge: 2021-03-03 | Disposition: A | Discharge status: Home / Self Care | Payer: Medicaid Other | Source: Ambulatory Visit

## 2021-03-03 ENCOUNTER — Ambulatory Visit (HOSPITAL_BASED_OUTPATIENT_CLINIC_OR_DEPARTMENT_OTHER): Payer: Medicaid Other

## 2021-03-03 DIAGNOSIS — N83201 Unspecified ovarian cyst, right side: Secondary | ICD-10-CM | POA: Insufficient documentation

## 2021-03-03 DIAGNOSIS — N83209 Unspecified ovarian cyst, unspecified side: Secondary | ICD-10-CM | POA: Diagnosis not present

## 2021-03-04 ENCOUNTER — Ambulatory Visit: Payer: Medicaid Other | Admitting: Physical Therapy

## 2021-03-07 IMAGING — US OBSTETRIC <14 WK ULTRASOUND
1 series · 14 of 28 positions shown · non-contrast
Comparison: 12/06/2017

CLINICAL DATA: Pelvic pain for 3 days

EXAM:
OBSTETRIC <14 WK US AND TRANSVAGINAL OB US
TECHNIQUE: Both transabdominal and transvaginal ultrasound examinations were
performed for complete evaluation of the gestation as well as the
maternal uterus, adnexal regions, and pelvic cul-de-sac.
Transvaginal technique was performed to assess early pregnancy.

[Series 1: obstetric <14 wk ultrasound · 14 of 114 slices shown]
[im 5/114]
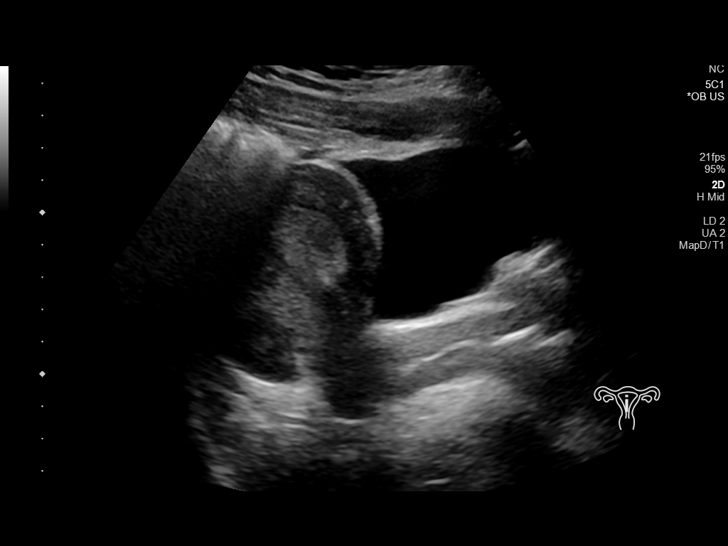
[im 13/114]
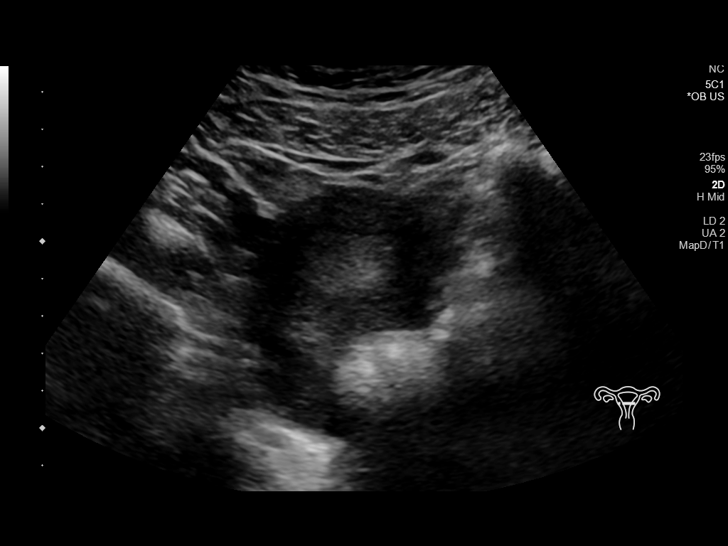
[im 21/114]
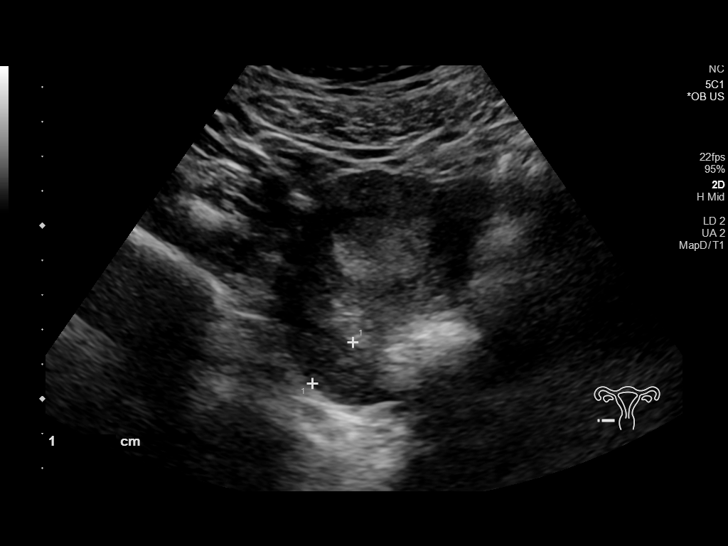
[im 30/114]
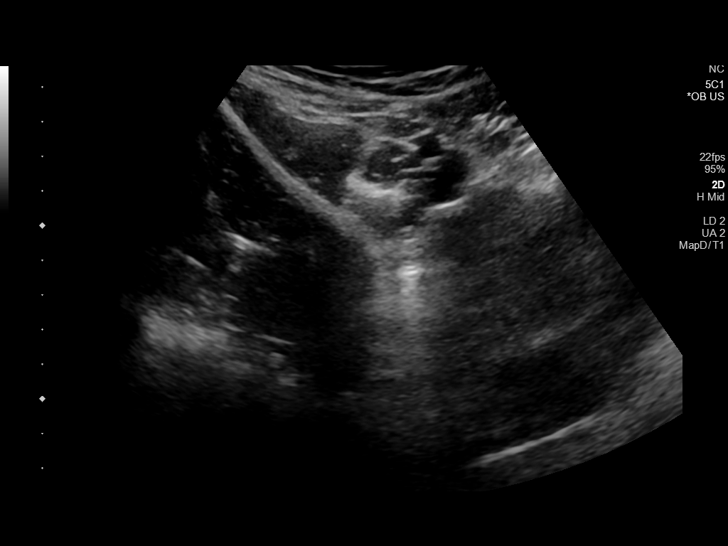
[im 38/114]
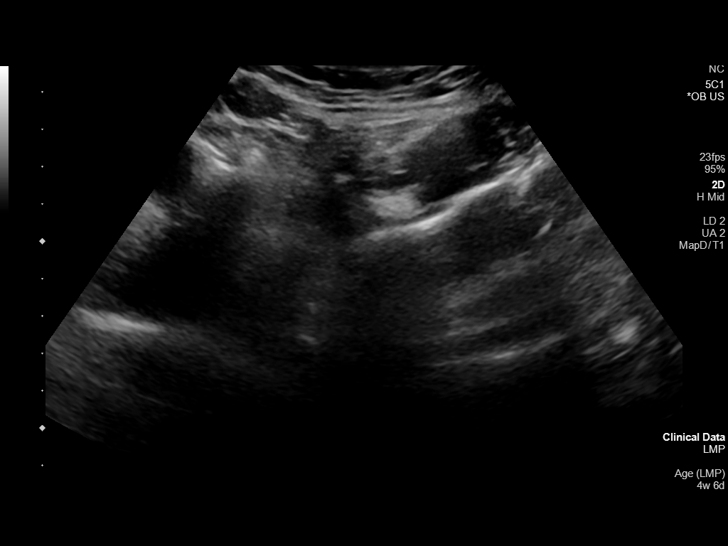
[im 47/114]
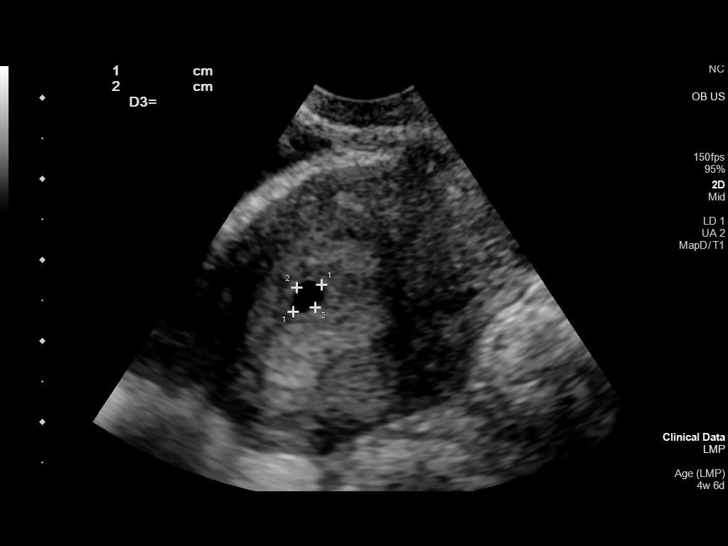
[im 55/114]
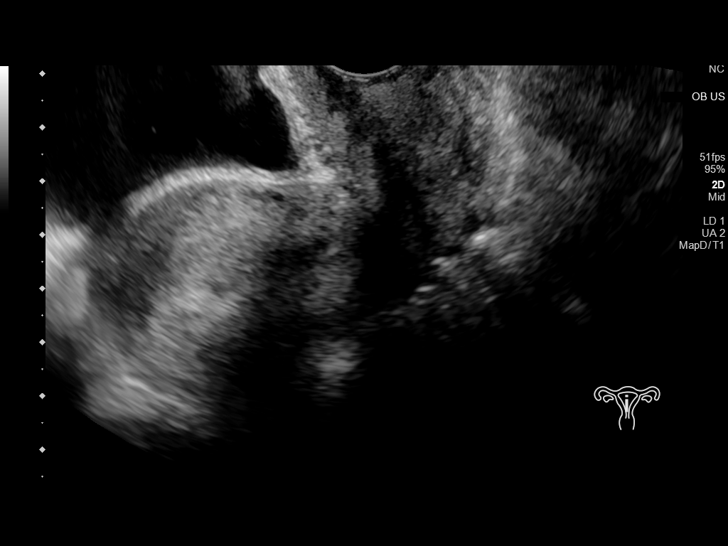
[im 63/114]
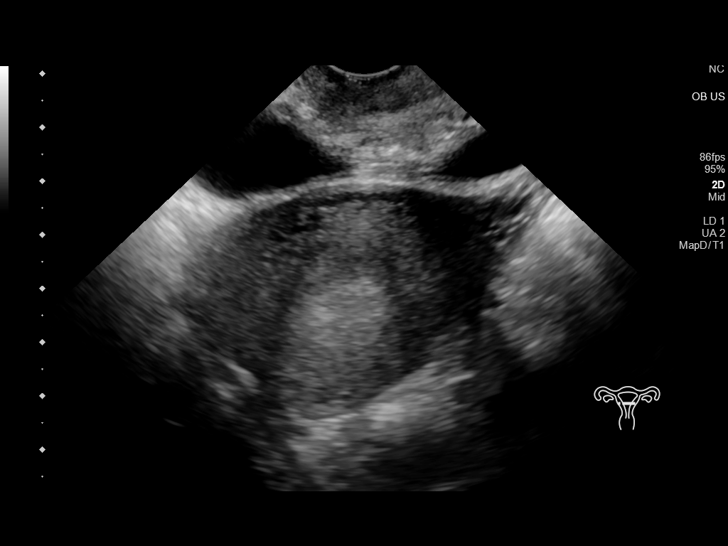
[im 72/114]
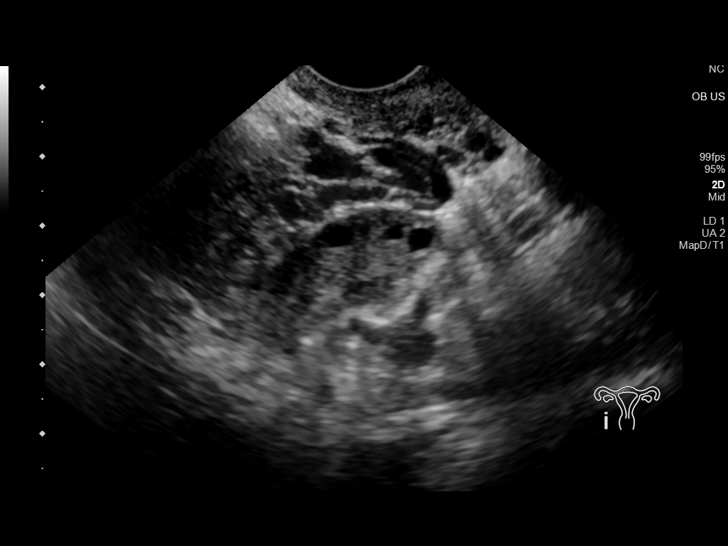
[im 80/114]
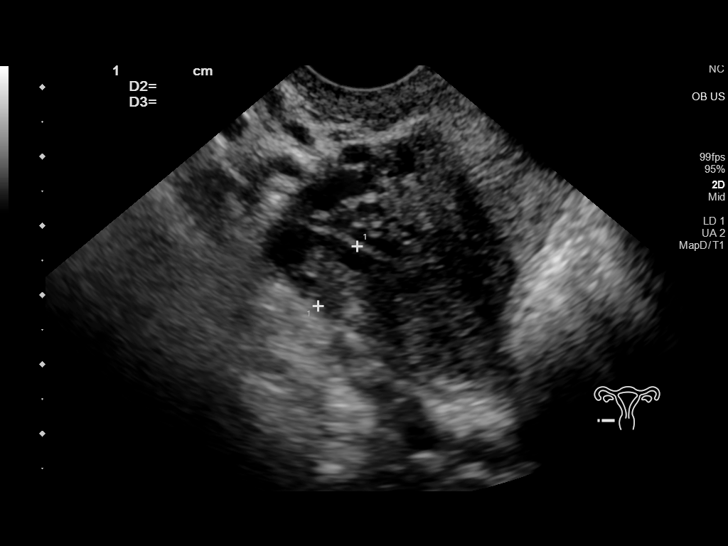
[im 88/114]
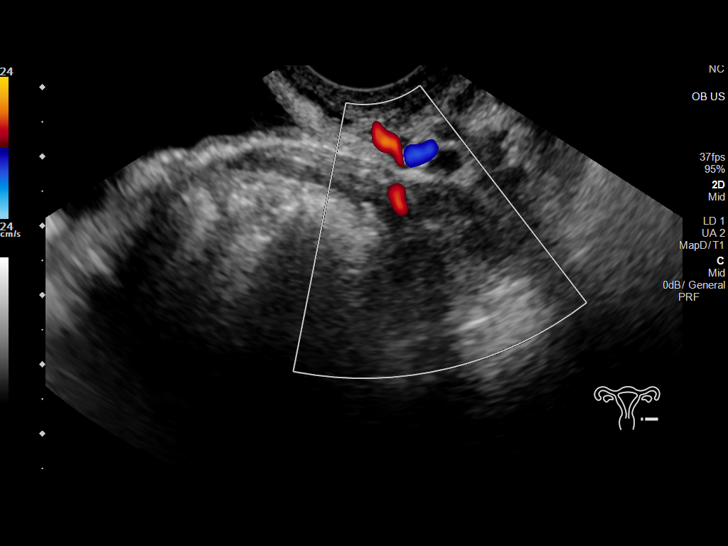
[im 97/114]
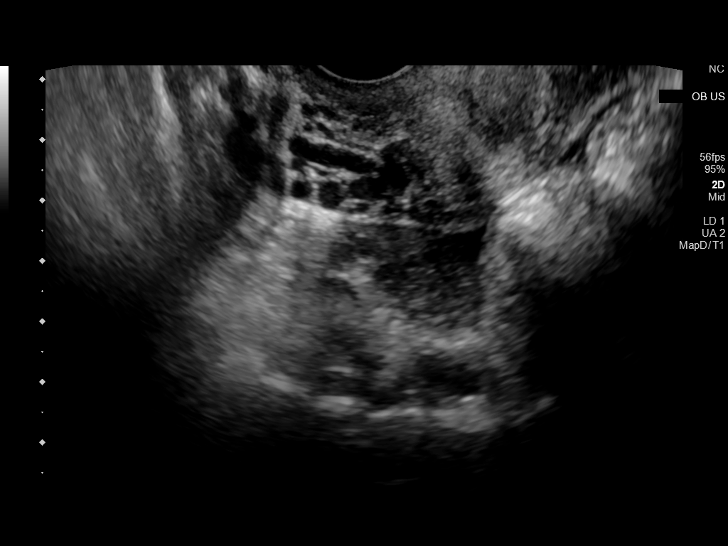
[im 105/114]
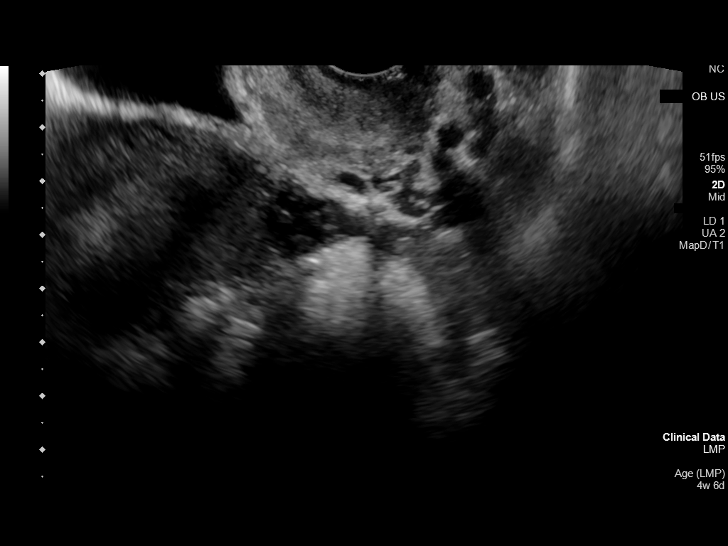
[im 114/114]
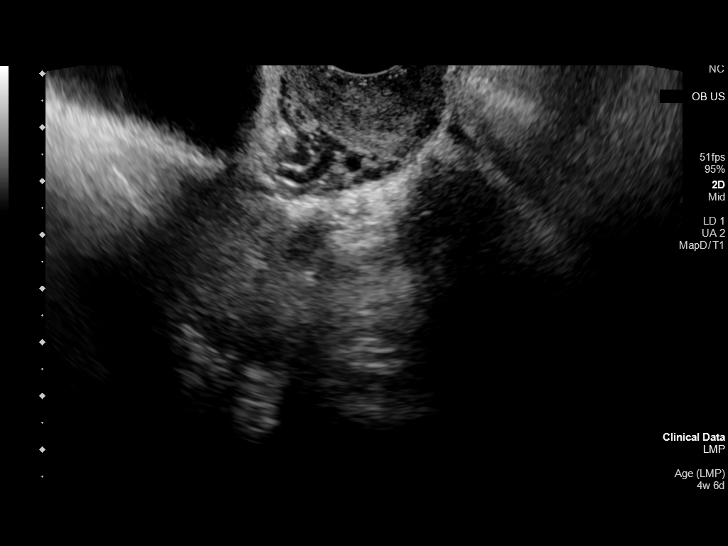

[14 of 28 positions shown; findings below may reference images not displayed]

FINDINGS: Intrauterine gestational sac: Single

Yolk sac:  Not Visualized.

Embryo:  Not Visualized.

Cardiac Activity: Not Visualized.

MSD: 4.5 mm   5 w   2 d

US EDC: 11/11/2019

Subchorionic hemorrhage:  None visualized.

Maternal uterus/adnexae: Within normal limits. No abnormal adnexal
lesions.
IMPRESSION: Single intrauterine gestational sac measuring 5 weeks 2 days by mean
sac diameter. No fetal pole or yolk sac identified at this time.
Differential considerations include: Pregnancy too early to
characterize, blighted ovum, or pseudo gestational sac from ectopic
pregnancy. Recommend close clinical follow-up, short interval repeat
exam, and serial beta HCG levels.

## 2021-03-07 IMAGING — US TRANSVAGINAL OB ULTRASOUND
1 series · 14 of 28 positions shown · non-contrast
Comparison: 12/06/2017

CLINICAL DATA: Pelvic pain for 3 days

EXAM:
OBSTETRIC <14 WK US AND TRANSVAGINAL OB US
TECHNIQUE: Both transabdominal and transvaginal ultrasound examinations were
performed for complete evaluation of the gestation as well as the
maternal uterus, adnexal regions, and pelvic cul-de-sac.
Transvaginal technique was performed to assess early pregnancy.

[Series 1: transvaginal ob ultrasound · 14 of 114 slices shown]
[im 5/114]
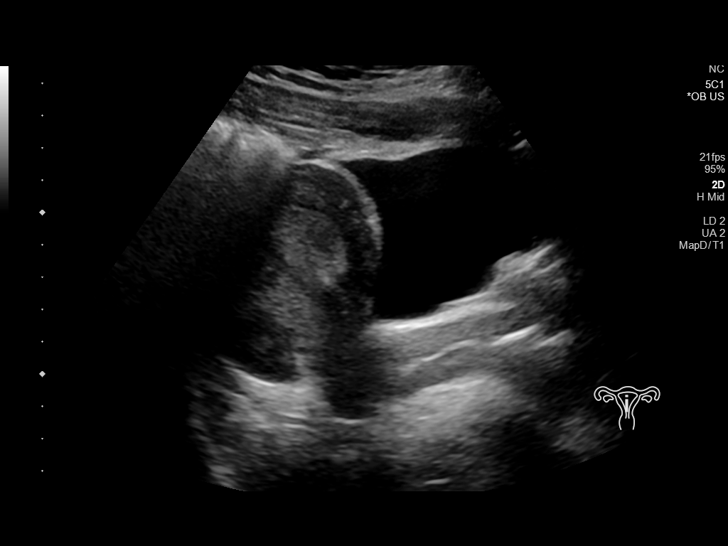
[im 13/114]
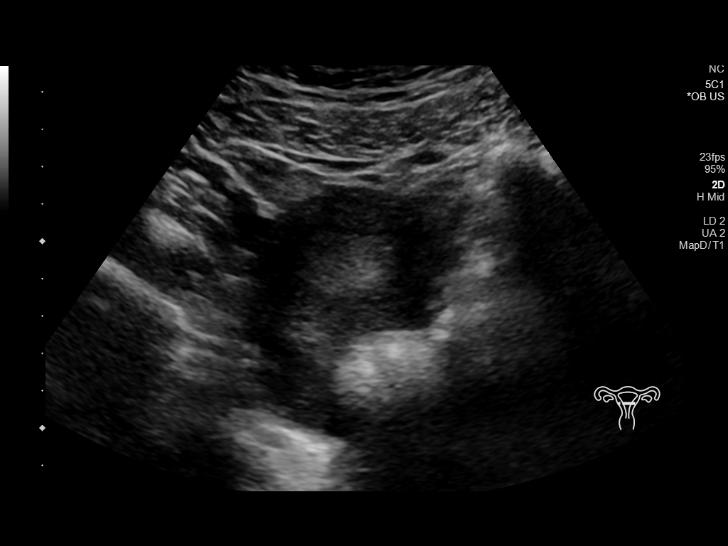
[im 21/114]
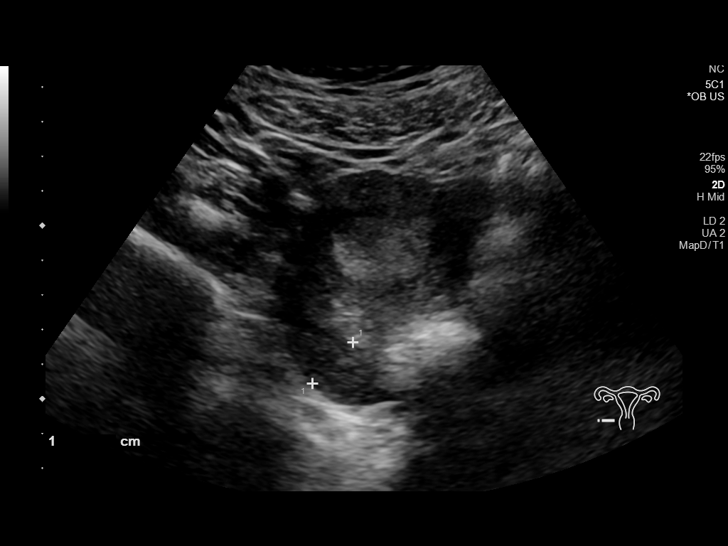
[im 30/114]
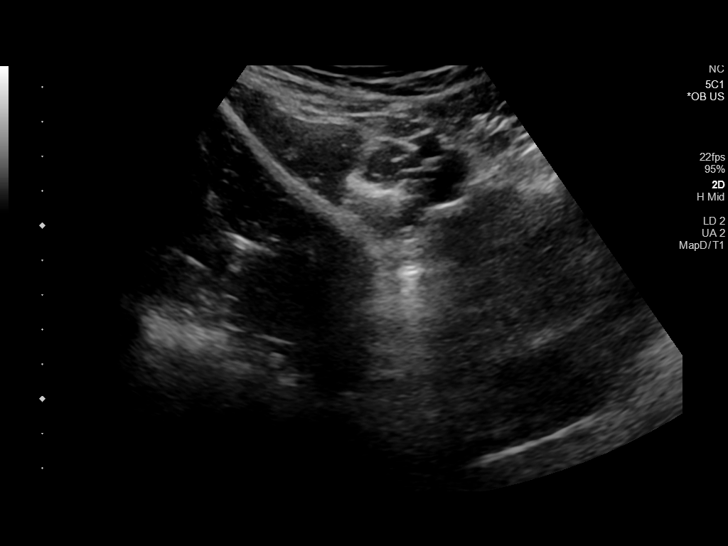
[im 38/114]
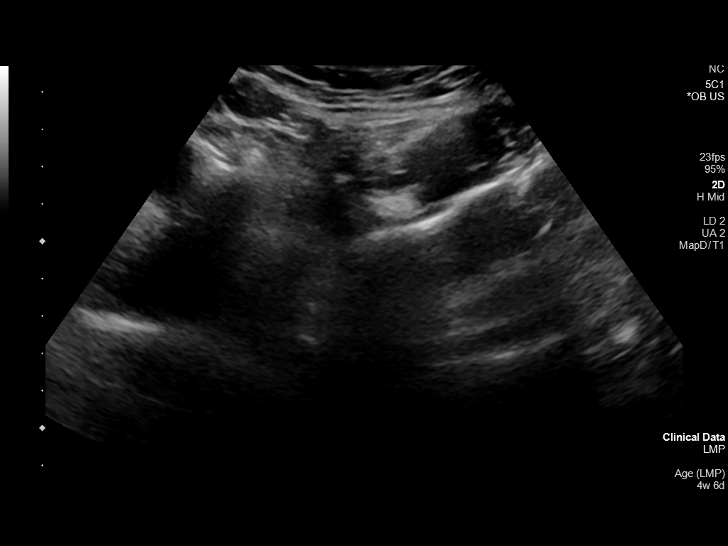
[im 47/114]
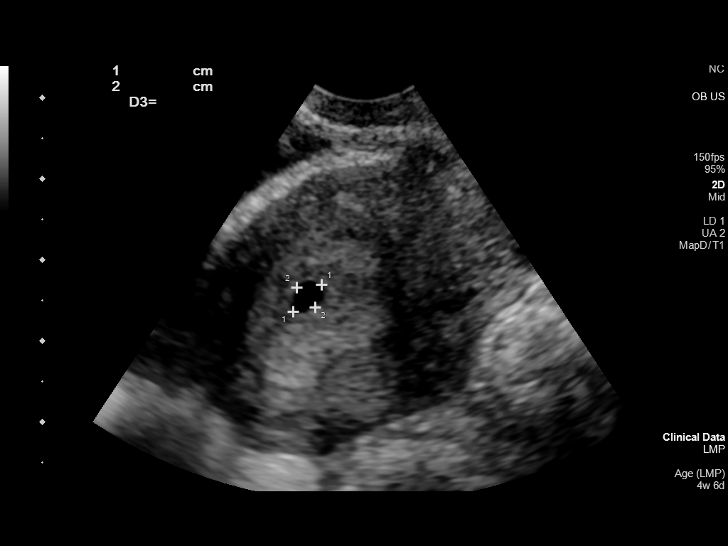
[im 55/114]
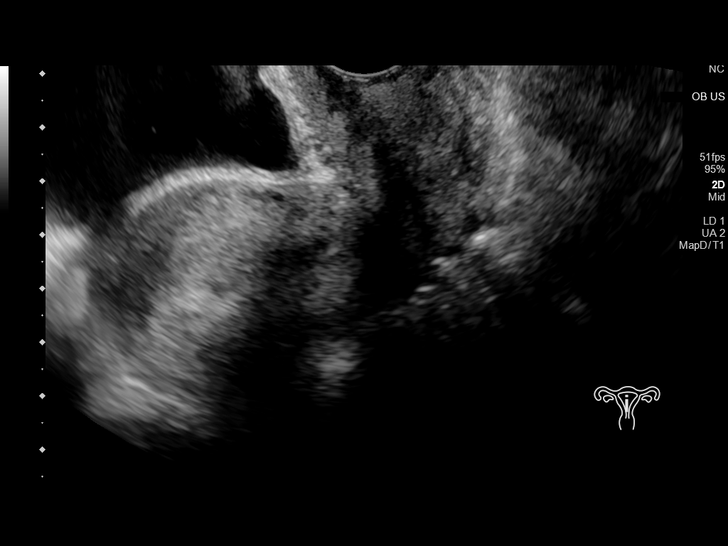
[im 63/114]
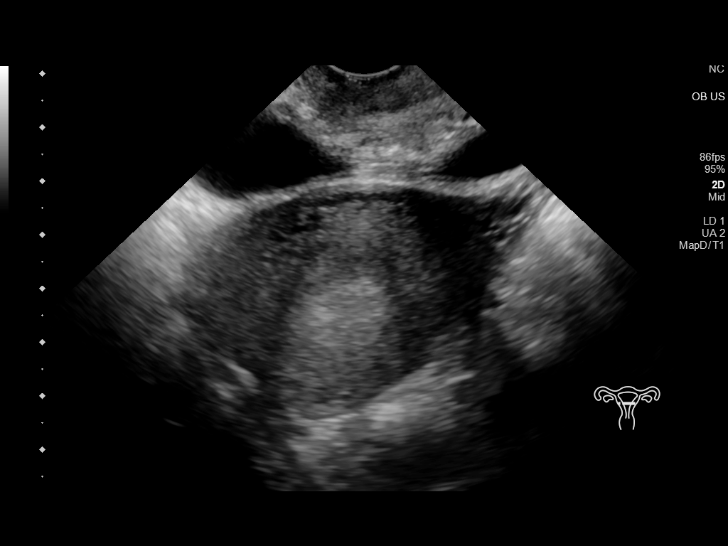
[im 72/114]
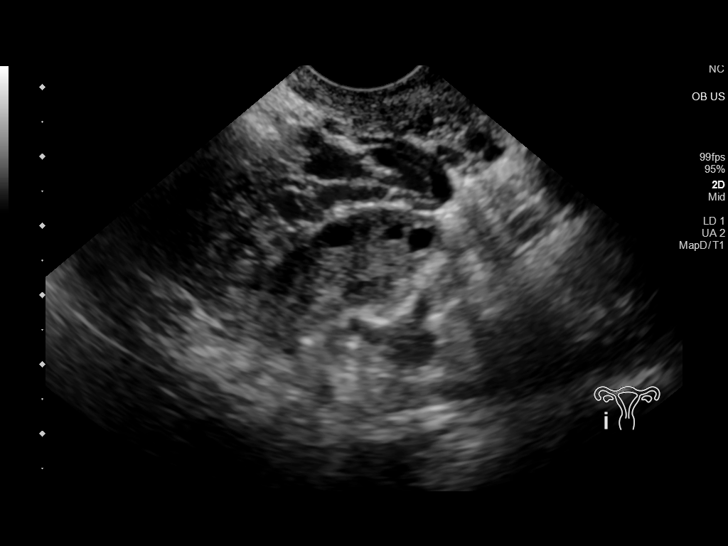
[im 80/114]
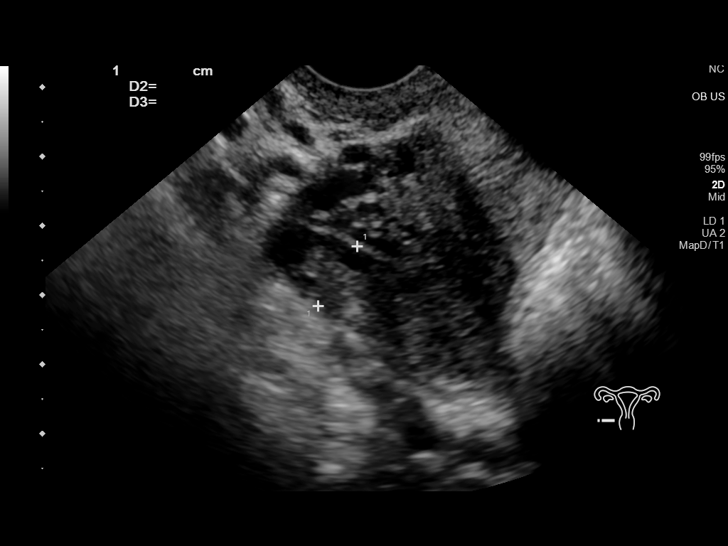
[im 88/114]
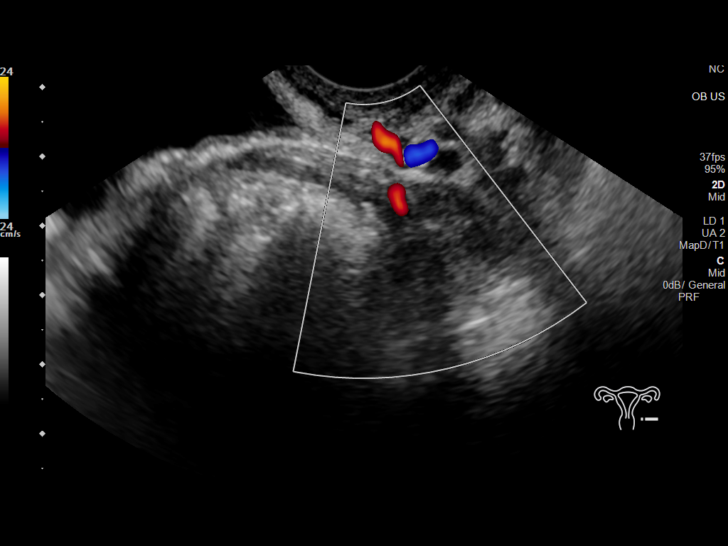
[im 97/114]
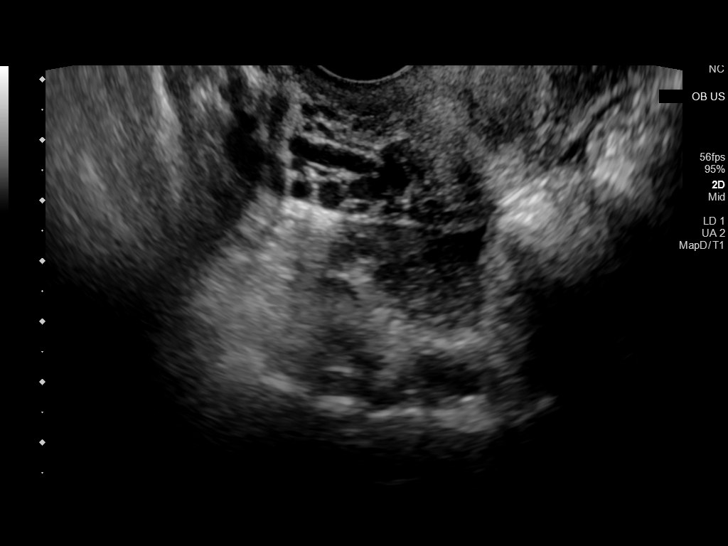
[im 105/114]
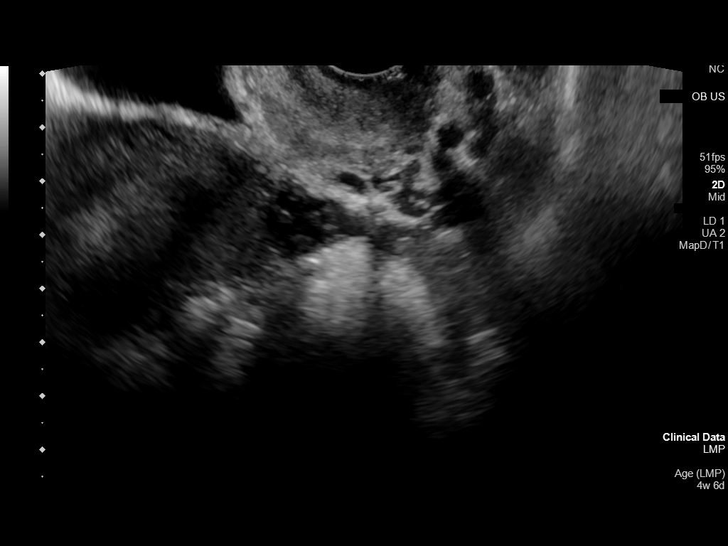
[im 114/114]
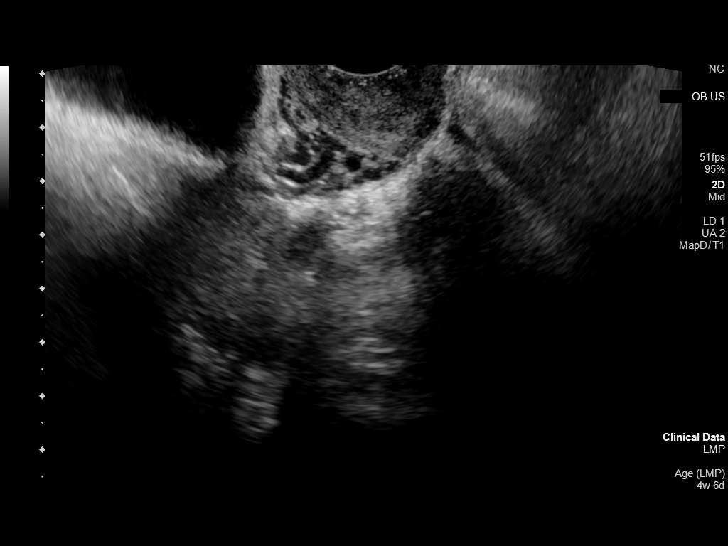

[14 of 28 positions shown; findings below may reference images not displayed]

FINDINGS: Intrauterine gestational sac: Single

Yolk sac:  Not Visualized.

Embryo:  Not Visualized.

Cardiac Activity: Not Visualized.

MSD: 4.5 mm   5 w   2 d

US EDC: 11/11/2019

Subchorionic hemorrhage:  None visualized.

Maternal uterus/adnexae: Within normal limits. No abnormal adnexal
lesions.
IMPRESSION: Single intrauterine gestational sac measuring 5 weeks 2 days by mean
sac diameter. No fetal pole or yolk sac identified at this time.
Differential considerations include: Pregnancy too early to
characterize, blighted ovum, or pseudo gestational sac from ectopic
pregnancy. Recommend close clinical follow-up, short interval repeat
exam, and serial beta HCG levels.

## 2021-03-08 DIAGNOSIS — F4323 Adjustment disorder with mixed anxiety and depressed mood: Secondary | ICD-10-CM | POA: Diagnosis not present

## 2021-03-09 IMAGING — US TRANSVAGINAL OB ULTRASOUND
1 series · 15 of 25 positions shown · non-contrast
Comparison: Pelvic ultrasound dated 03/13/2019

CLINICAL DATA: 23-year-old female with right lower quadrant
abdominal pain. LMP: 02/07/2019 corresponding to an estimated
gestational age of 5 weeks, 1 day.

EXAM:
TRANSVAGINAL OB ULTRASOUND
TECHNIQUE: Transvaginal ultrasound was performed for complete evaluation of the
gestation as well as the maternal uterus, adnexal regions, and
pelvic cul-de-sac.

[Series 1: transvaginal ob ultrasound · 25 acquisitions, 15 frames shown]
[im 1/25]
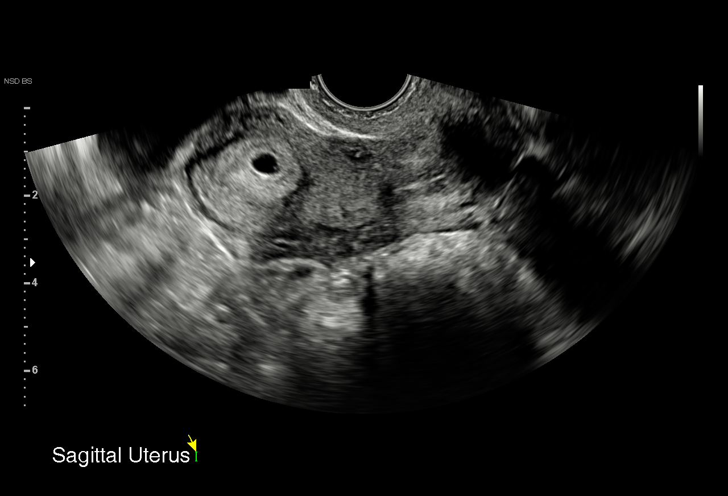
[im 3/25]
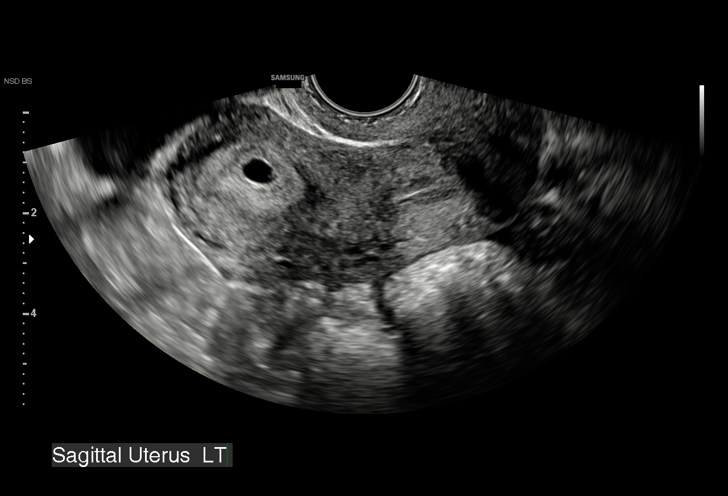
[im 5/25]
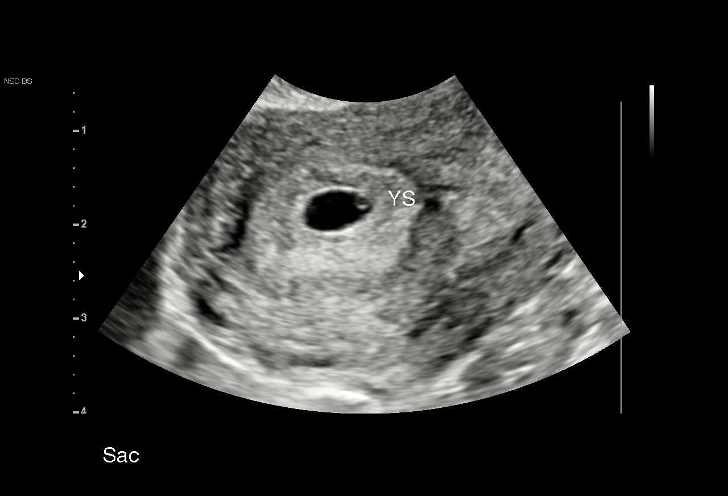
[im 6/25]
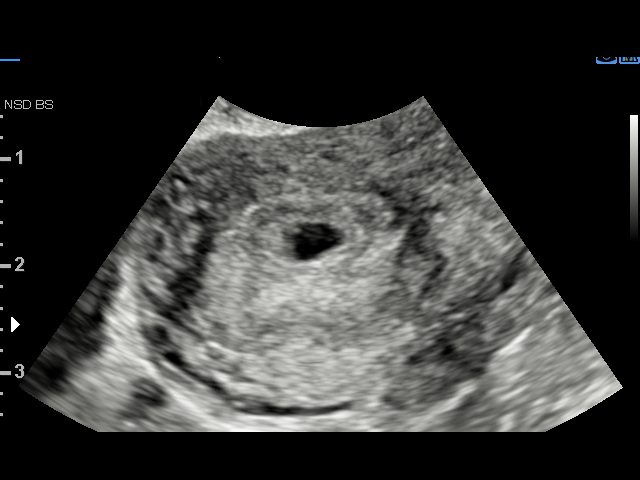
[im 8/25]
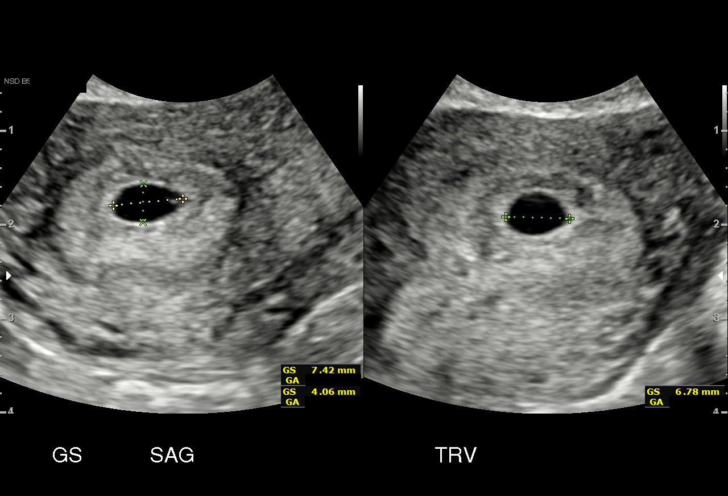
[im 10/25]
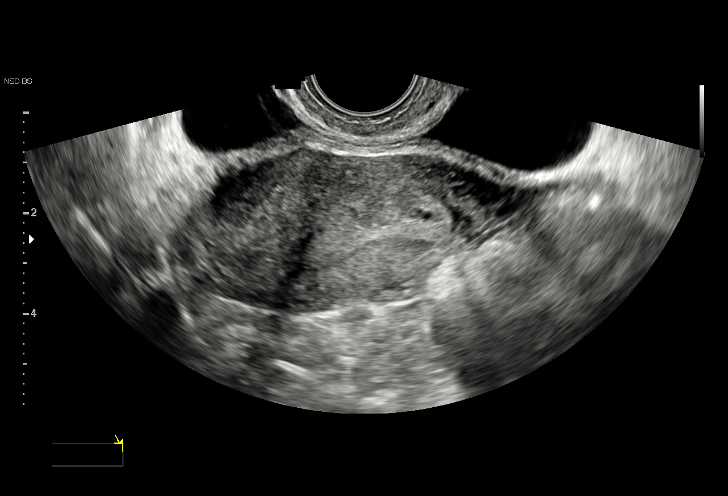
[im 11/25]
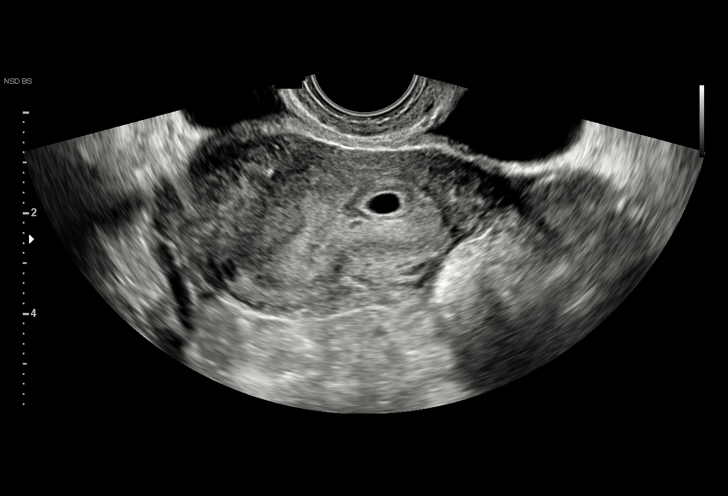
[im 13/25]
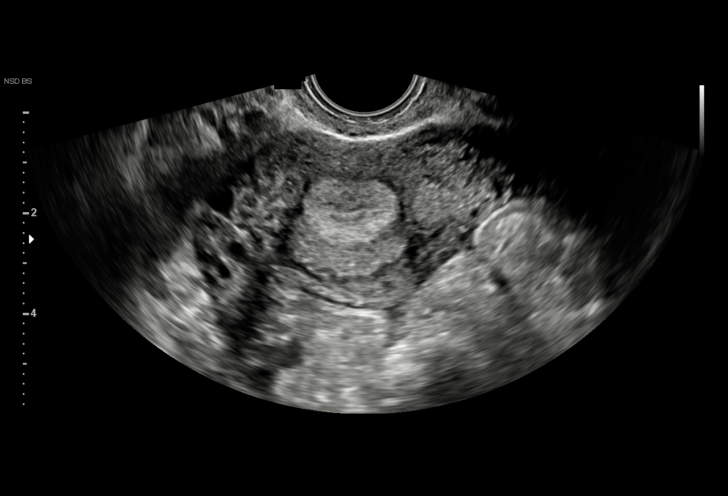
[im 15/25]
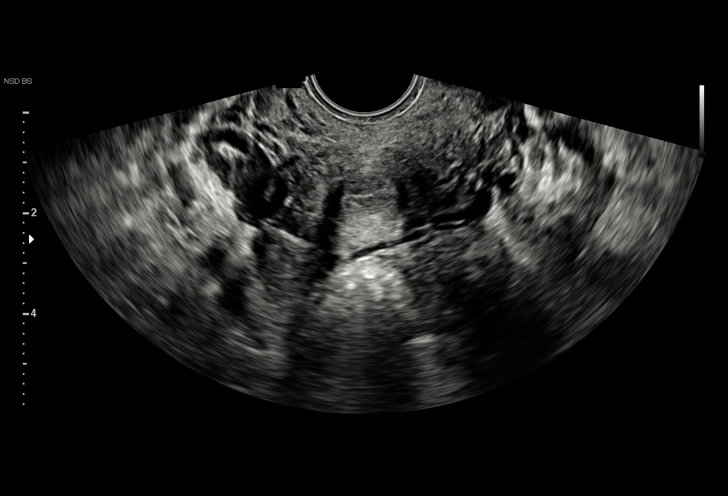
[im 16/25]
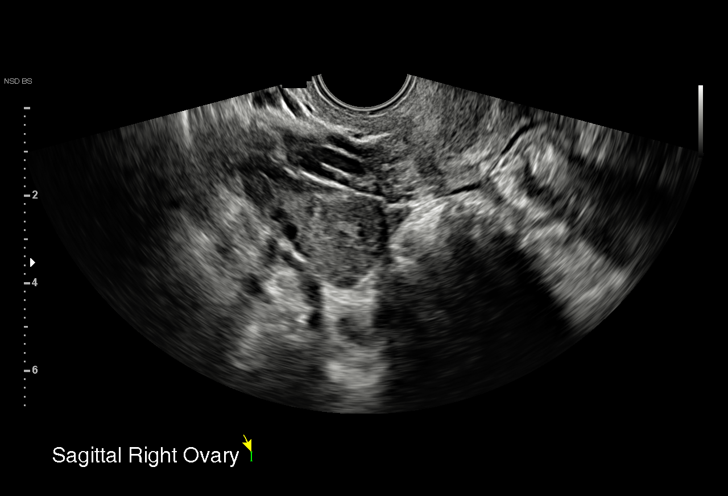
[im 18/25]
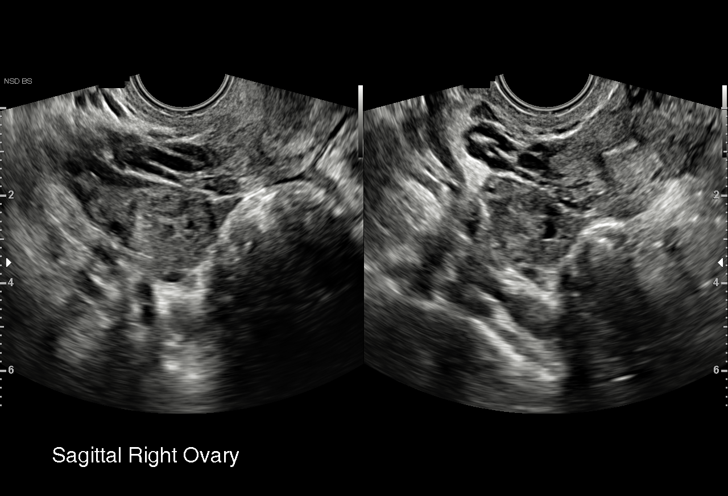
[im 20/25]
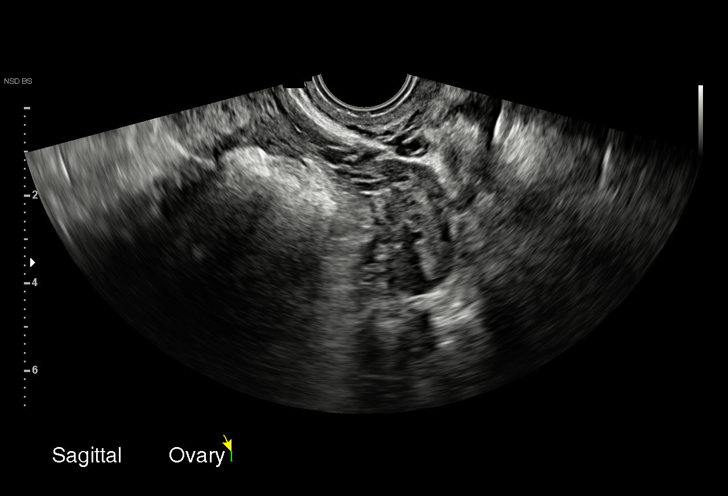
[im 21/25]
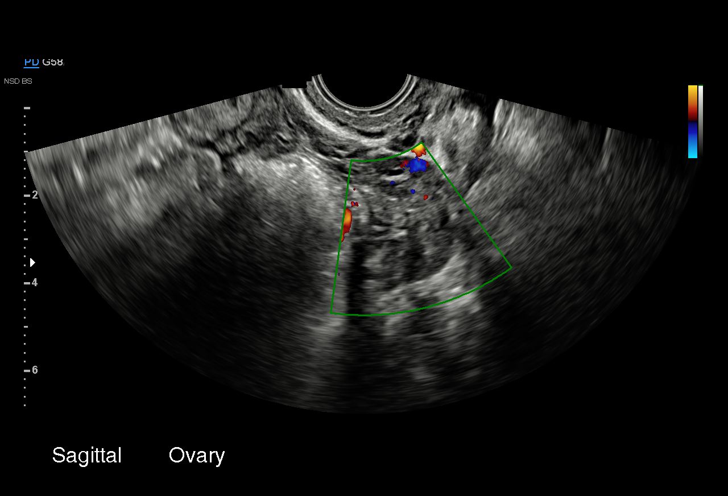
[im 23/25]
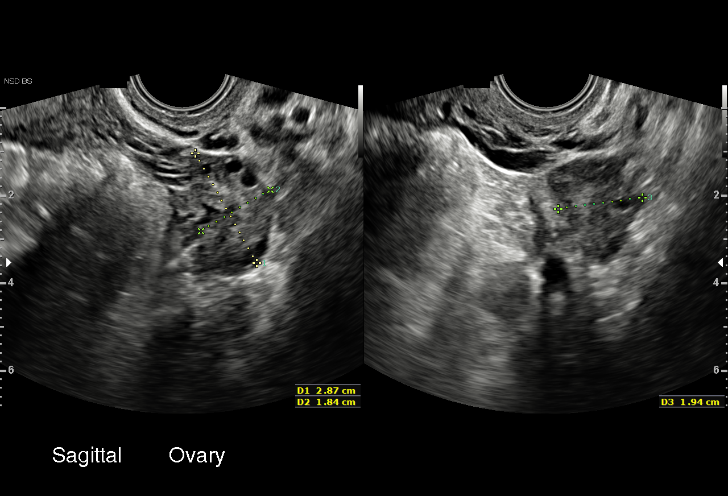
[im 25/25]
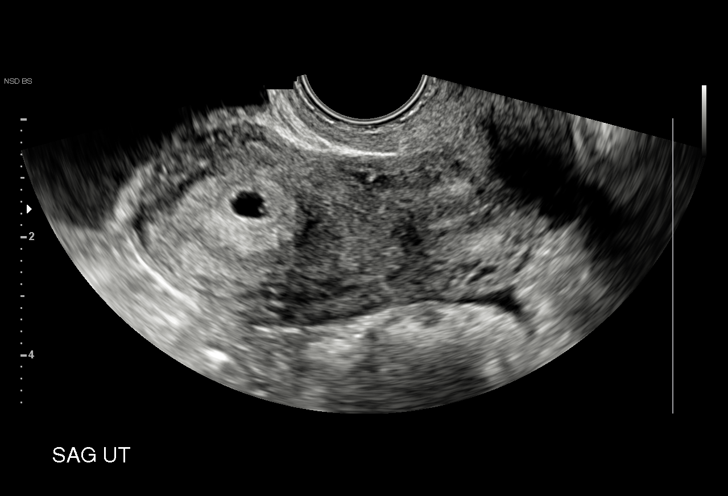

[15 of 25 positions shown; findings below may reference images not displayed]

FINDINGS: Intrauterine gestational sac: Single intrauterine gestational sac.

Yolk sac:  Seen

Embryo:  Not present

MSD: 6 mm   5 w   2 d

Subchorionic hemorrhage:  None visualized.

Maternal uterus/adnexae: The ovaries are unremarkable.
IMPRESSION: Single intrauterine gestational sac with an estimated gestational
age of 5 weeks, 2 days concordant with dates based on LMP. No fetal
pole identified at this time. Repeat ultrasound in 7-11 days, or
earlier if clinically indicated, recommended.

## 2021-03-10 ENCOUNTER — Ambulatory Visit: Payer: Medicaid Other | Admitting: Physical Therapy

## 2021-03-17 DIAGNOSIS — F4323 Adjustment disorder with mixed anxiety and depressed mood: Secondary | ICD-10-CM | POA: Diagnosis not present

## 2021-03-18 ENCOUNTER — Telehealth: Payer: Self-pay | Admitting: Physical Therapy

## 2021-03-18 ENCOUNTER — Ambulatory Visit: Payer: Medicaid Other | Admitting: Physical Therapy

## 2021-03-18 DIAGNOSIS — M6281 Muscle weakness (generalized): Secondary | ICD-10-CM | POA: Insufficient documentation

## 2021-03-18 DIAGNOSIS — R279 Unspecified lack of coordination: Secondary | ICD-10-CM | POA: Insufficient documentation

## 2021-03-18 DIAGNOSIS — R252 Cramp and spasm: Secondary | ICD-10-CM | POA: Insufficient documentation

## 2021-03-18 DIAGNOSIS — R293 Abnormal posture: Secondary | ICD-10-CM | POA: Insufficient documentation

## 2021-03-18 NOTE — Telephone Encounter (Signed)
PT called pt about this morning's appointment at [phone]. Pt did not answer, voicemail left. Pt has missed three appointments currently, in a row and unfortunately will be discharged from PT due to our attendance policy.   Otelia Sergeant, PT, DPT 09/02/228:31 AM

## 2021-03-23 ENCOUNTER — Telehealth: Payer: Self-pay | Admitting: Physical Therapy

## 2021-03-23 NOTE — Telephone Encounter (Signed)
Discussed with scheduling to return pt to caseload one appointment at a time to allow pt more scheduling flexibility and limit cancellations.   Otelia Sergeant, PT, DPT 09/07/223:23 PM

## 2021-03-23 NOTE — Telephone Encounter (Signed)
I called the patient twice. The first time the patient answered. Then the call disconnected. I called the patient back and had to leave a message.

## 2021-03-24 ENCOUNTER — Ambulatory Visit: Payer: Medicaid Other | Admitting: Physical Therapy

## 2021-03-24 ENCOUNTER — Encounter: Payer: Self-pay | Admitting: Physical Therapy

## 2021-03-24 ENCOUNTER — Other Ambulatory Visit: Payer: Self-pay

## 2021-03-24 DIAGNOSIS — M6281 Muscle weakness (generalized): Secondary | ICD-10-CM

## 2021-03-24 DIAGNOSIS — R252 Cramp and spasm: Secondary | ICD-10-CM

## 2021-03-24 DIAGNOSIS — R293 Abnormal posture: Secondary | ICD-10-CM | POA: Diagnosis present

## 2021-03-24 DIAGNOSIS — R279 Unspecified lack of coordination: Secondary | ICD-10-CM | POA: Diagnosis present

## 2021-03-24 DIAGNOSIS — F4323 Adjustment disorder with mixed anxiety and depressed mood: Secondary | ICD-10-CM | POA: Diagnosis not present
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
## 2021-03-24 NOTE — Patient Instructions (Addendum)
[email or web address]    Access Code: [email or web address] URL: [email or web address] Date: 03/24/2021 Prepared by: [facility]  Exercises Supine Diaphragmatic Breathing - 1 x daily - 7 x weekly - 1 sets - 10 reps Hooklying Transversus Abdominis Palpation - 1 x daily - 7 x weekly - 1 sets - 10 reps - 2-3s holds V Sit Hip Adductor Hamstring Stretch - 1 x daily - 7 x weekly - 1 sets - 2-3 reps - 30-45s holds Supine Hip Adductor Stretch - 1 x daily - 7 x weekly - 1 sets - 2-3 reps - 45s hold Supine Pelvic Floor Stretch - 1 x daily - 7 x weekly - 1 sets - 2-3 reps - 45s hold Diaphragmatic Breathing in Child's Pose with Pelvic Floor Relaxation - 1 x daily - 7 x weekly - 1 sets - 2-3 reps - 45s hold Modified Thomas Stretch - 1 x daily - 7 x weekly - 1 sets - 2-3 reps - 45s hold   [facility] [address], [address] [place], [place] [address] Phone # [phone] Fax [phone]

## 2021-03-25 ENCOUNTER — Encounter: Payer: Medicaid Other | Admitting: Physical Therapy

## 2021-03-28 IMAGING — US US OB TRANSVAGINAL
1 series · 15 of 24 positions shown · non-contrast
Comparison: March 15, 2019 and March 25, 2019

CLINICAL DATA: Vaginal bleeding

EXAM:
OBSTETRIC <14 WK ULTRASOUND
TECHNIQUE: Transabdominal ultrasound was performed for evaluation of the
gestation as well as the maternal uterus and adnexal regions.

[Series 1: us ob transvaginal · 15 of 24 slices shown]
[im 1/24]
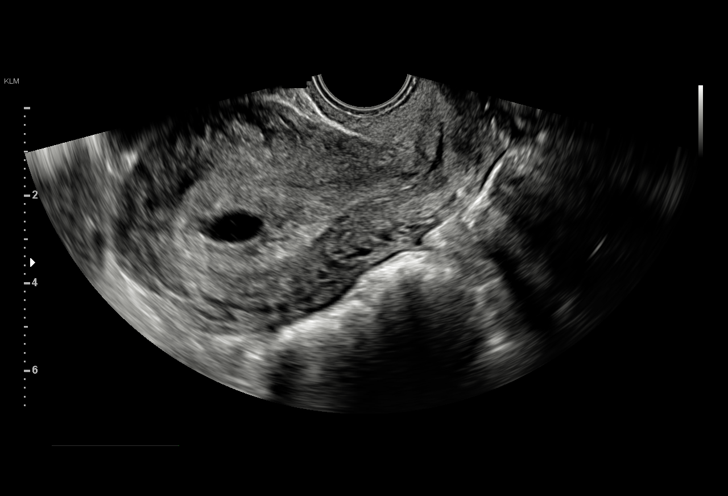
[im 3/24]
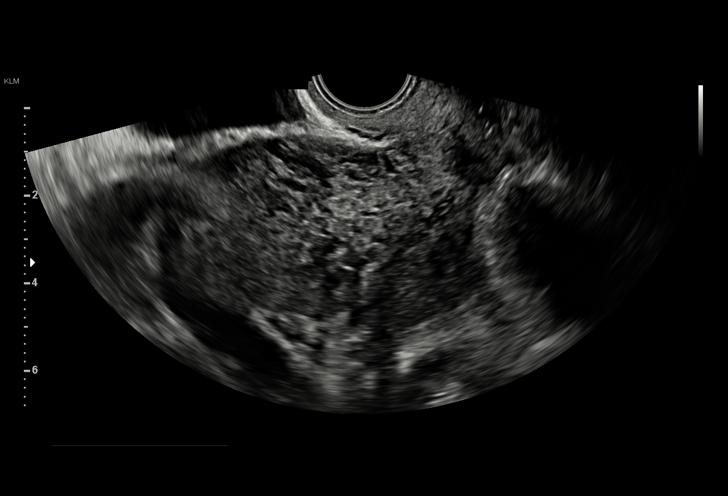
[im 5/24]
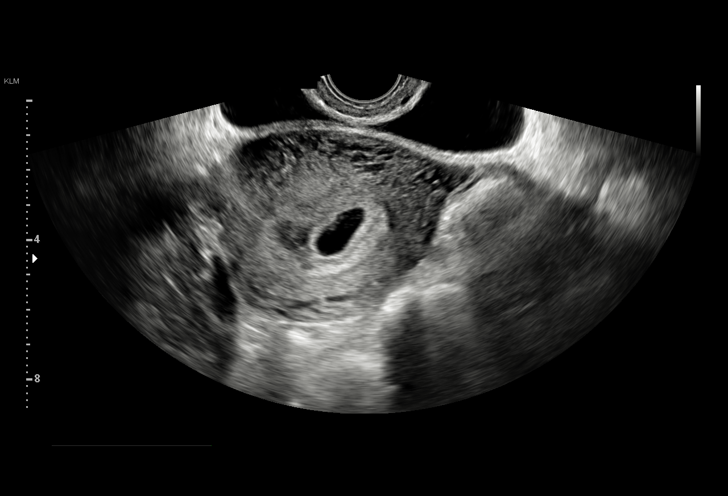
[im 6/24]
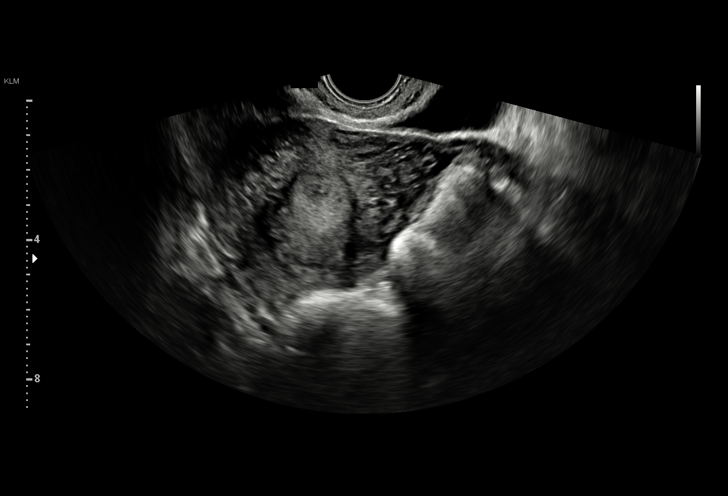
[im 8/24]
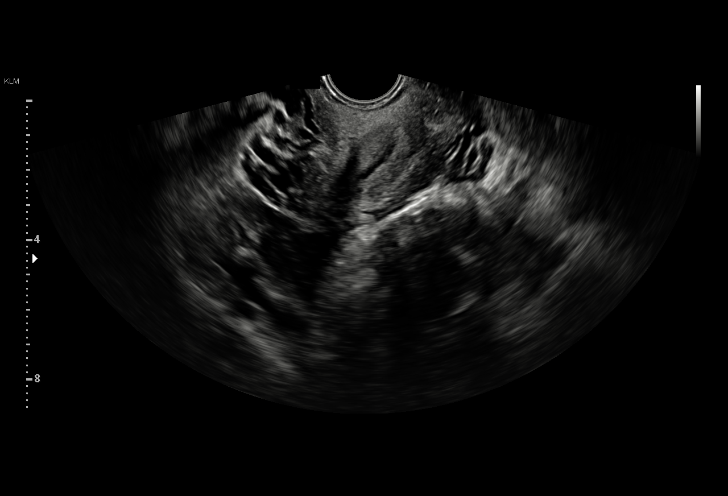
[im 9/24]
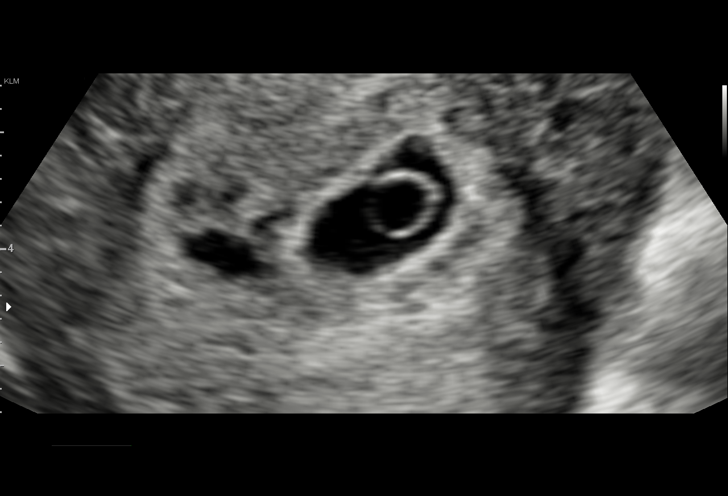
[im 11/24]
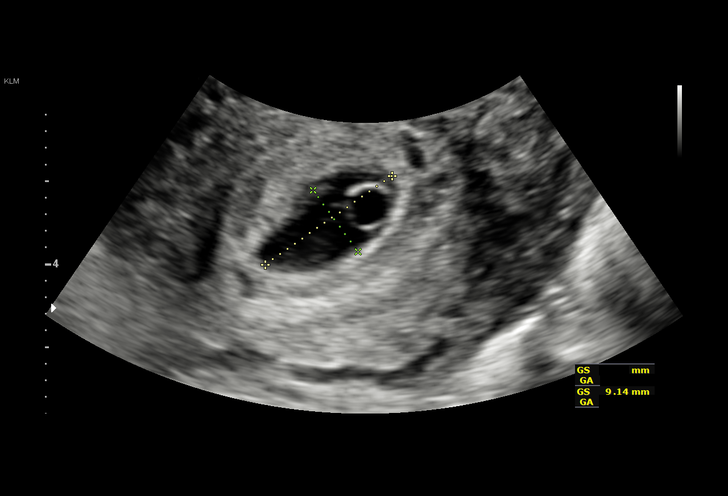
[im 13/24]
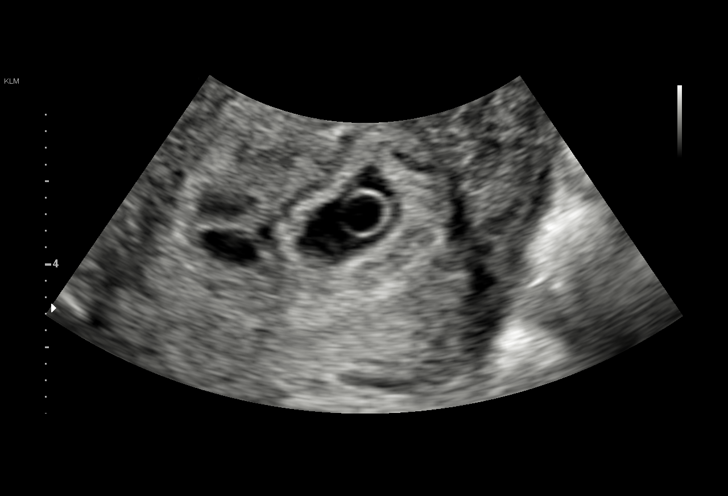
[im 14/24]
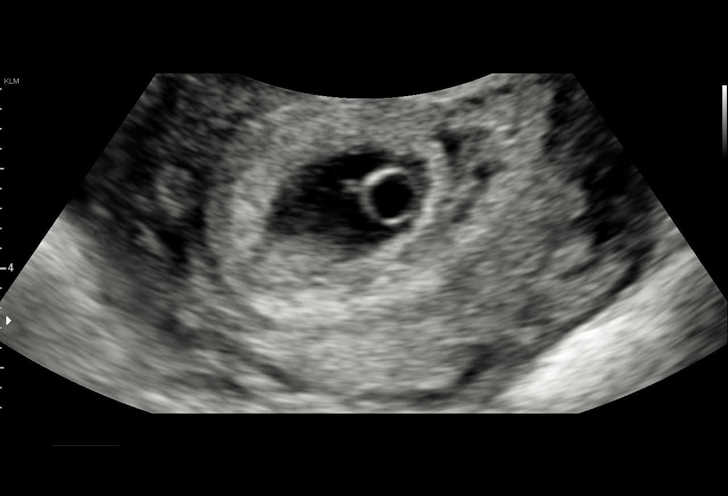
[im 16/24]
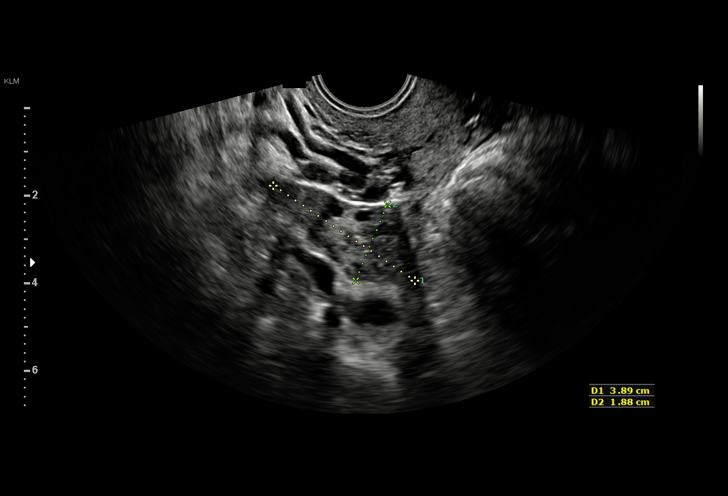
[im 17/24]
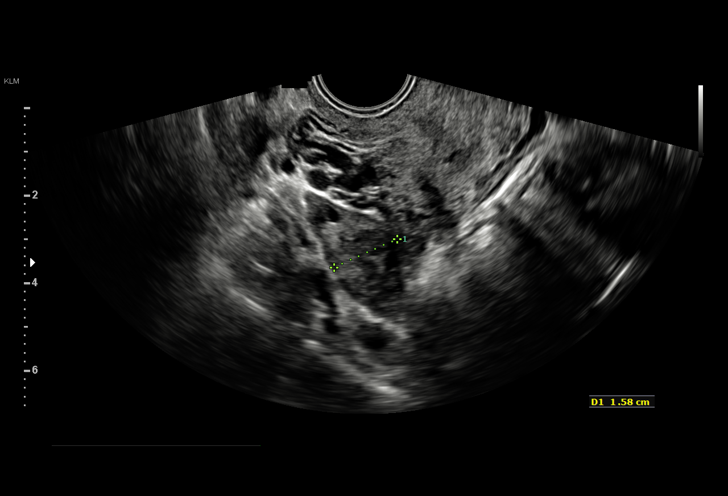
[im 19/24]
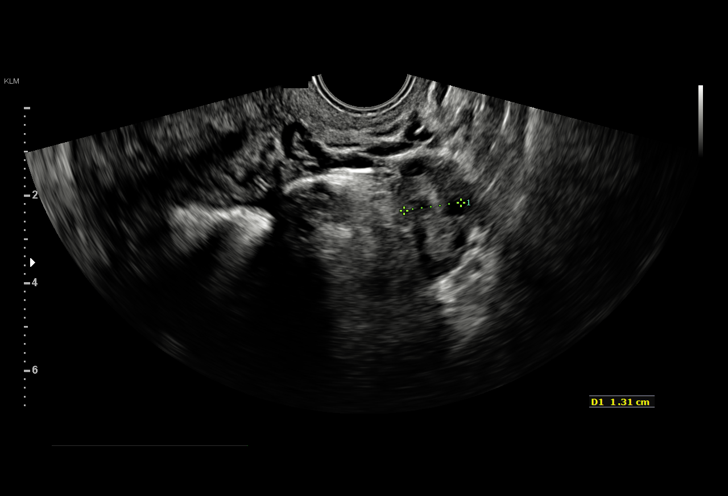
[im 21/24]
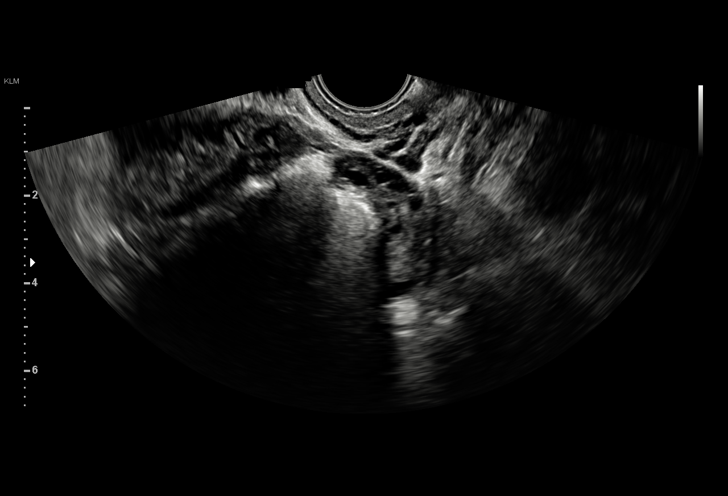
[im 22/24]
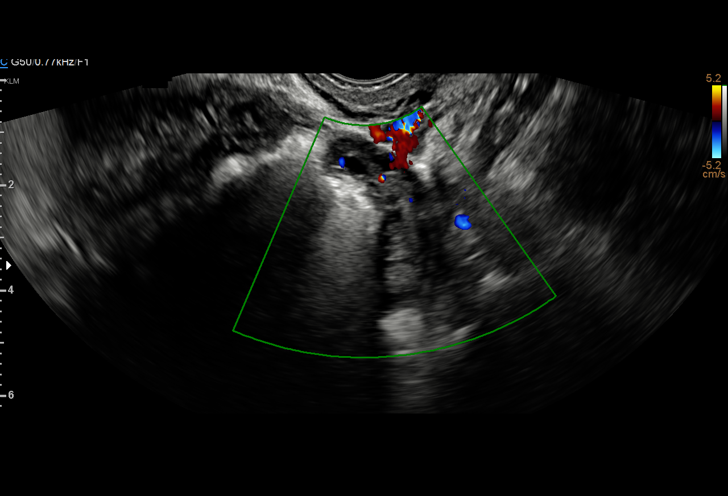
[im 24/24]
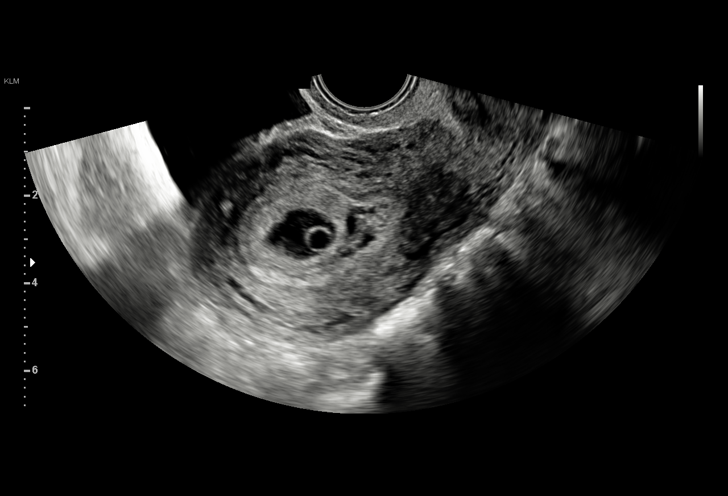

[15 of 24 positions shown; findings below may reference images not displayed]

FINDINGS: Intrauterine gestational sac: Single

Yolk sac:  Visualized.

Embryo:  Not Visualized.

MSD:  12.64 mm   6 w   0 d

Subchorionic hemorrhage: A small subchorionic hemorrhage is
identified.

Maternal uterus/adnexae: The ovaries are normal in appearance.
IMPRESSION: 1. The lack of a fetal pole 19 days after visualization of a
gestational sac containing a yolk sac is consistent with a
definitely nonviable pregnancy. Findings meet definitive criteria
for failed pregnancy. This follows SRU consensus guidelines:
Diagnostic Criteria for Nonviable Pregnancy Early in the First
Trimester. N Engl J Med 6377;[DATE].
2. Small subchorionic hemorrhage.

## 2021-03-29 DIAGNOSIS — F4323 Adjustment disorder with mixed anxiety and depressed mood: Secondary | ICD-10-CM | POA: Diagnosis not present

## 2021-04-06 ENCOUNTER — Ambulatory Visit: Payer: Medicaid Other | Admitting: Physical Therapy

## 2021-04-12 ENCOUNTER — Ambulatory Visit: Payer: Medicaid Other | Admitting: Physical Therapy

## 2021-04-12 ENCOUNTER — Other Ambulatory Visit: Payer: Self-pay

## 2021-04-12 DIAGNOSIS — M6281 Muscle weakness (generalized): Secondary | ICD-10-CM

## 2021-04-12 DIAGNOSIS — R293 Abnormal posture: Secondary | ICD-10-CM

## 2021-04-12 DIAGNOSIS — R252 Cramp and spasm: Secondary | ICD-10-CM
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
## 2021-04-13 ENCOUNTER — Ambulatory Visit: Payer: Medicaid Other | Admitting: Physical Therapy

## 2021-04-13 DIAGNOSIS — F4310 Post-traumatic stress disorder, unspecified: Secondary | ICD-10-CM | POA: Diagnosis not present

## 2021-04-20 ENCOUNTER — Encounter: Payer: Medicaid Other | Admitting: Physical Therapy

## 2021-04-20 DIAGNOSIS — F4310 Post-traumatic stress disorder, unspecified: Secondary | ICD-10-CM | POA: Diagnosis not present
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
## 2021-04-25 ENCOUNTER — Ambulatory Visit: Payer: Medicaid Other | Admitting: Neurology

## 2021-04-25 ENCOUNTER — Encounter: Payer: Self-pay | Admitting: Neurology

## 2021-04-25 ENCOUNTER — Other Ambulatory Visit: Payer: Self-pay

## 2021-04-25 VITALS — BP 111/73 | HR 90 | Ht 59.0 in | Wt 168.6 lb

## 2021-04-25 DIAGNOSIS — Z87820 Personal history of traumatic brain injury: Secondary | ICD-10-CM | POA: Diagnosis not present

## 2021-04-25 DIAGNOSIS — G43709 Chronic migraine without aura, not intractable, without status migrainosus: Secondary | ICD-10-CM

## 2021-04-25 NOTE — Patient Instructions (Addendum)
  To help reduce HEADACHES: Coenzyme Q10 100mg  THREE TIMES DAILY Riboflavin/Vitamin B2 400mg  ONCE DAILY Magnesium oxide 400mg  ONCE DAILY May stop after headaches are resolved.                                                                                                  To help decrease inflammation Alpha Lipoic Acid 100mg  TWICE DAILY Turmeric 500mg  twice daily  If this is ineffective, we can always start an antidepressant that may help with headaches  Follow up 4-6 months.

## 2021-04-27 ENCOUNTER — Encounter: Payer: Medicaid Other | Admitting: Physical Therapy

## 2021-04-27 DIAGNOSIS — F4310 Post-traumatic stress disorder, unspecified: Secondary | ICD-10-CM | POA: Diagnosis not present

## 2021-04-28 ENCOUNTER — Other Ambulatory Visit: Payer: Self-pay

## 2021-04-28 ENCOUNTER — Ambulatory Visit: Payer: Medicaid Other

## 2021-04-28 VITALS — BP 117/76 | HR 74 | Ht 59.0 in | Wt 168.0 lb

## 2021-04-28 DIAGNOSIS — N943 Premenstrual tension syndrome: Secondary | ICD-10-CM | POA: Diagnosis not present

## 2021-04-28 DIAGNOSIS — R5383 Other fatigue: Secondary | ICD-10-CM | POA: Diagnosis not present
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
## 2021-04-28 NOTE — Progress Notes (Signed)
GYN present for FU from PT referral, discuss results.

## 2021-05-01 DIAGNOSIS — N943 Premenstrual tension syndrome: Secondary | ICD-10-CM

## 2021-05-01 DIAGNOSIS — R5383 Other fatigue: Secondary | ICD-10-CM | POA: Insufficient documentation

## 2021-05-01 HISTORY — DX: Premenstrual tension syndrome: N94.3

## 2021-05-02 ENCOUNTER — Telehealth: Payer: Self-pay

## 2021-05-02 NOTE — Telephone Encounter (Signed)
Attempted to reach out to patient, no answer or voice mail to leave a message regarding yeast infection medication.

## 2021-05-03 ENCOUNTER — Encounter: Payer: Medicaid Other | Admitting: Physical Therapy

## 2021-05-03 ENCOUNTER — Other Ambulatory Visit: Payer: Self-pay

## 2021-05-03 ENCOUNTER — Ambulatory Visit: Payer: Medicaid Other | Attending: Family Medicine | Admitting: Physical Therapy

## 2021-05-03 DIAGNOSIS — R252 Cramp and spasm: Secondary | ICD-10-CM | POA: Insufficient documentation

## 2021-05-03 DIAGNOSIS — R279 Unspecified lack of coordination: Secondary | ICD-10-CM | POA: Insufficient documentation

## 2021-05-03 NOTE — Patient Instructions (Signed)
Pelvic Floor Wands                                            Serenty TMT rectal                                                                         Serenity  TMT vaginal                                                     Intimate Rose Pelvic wand with vibration                       Intimate Rose pelvic wand Hot/cold temperature  Intimate Rose Pelvic Wand                                                                  Original Therawand                                                                                     Deluxe Therawand  These wands can be ordered from [email or web address], [email or web address], [email or web address].    
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
## 2021-05-04 ENCOUNTER — Encounter: Payer: Medicaid Other | Admitting: Physical Therapy

## 2021-05-04 DIAGNOSIS — F4310 Post-traumatic stress disorder, unspecified: Secondary | ICD-10-CM | POA: Diagnosis not present

## 2021-05-11 ENCOUNTER — Ambulatory Visit: Payer: Medicaid Other | Admitting: Physical Therapy

## 2021-05-11 ENCOUNTER — Encounter: Payer: Medicaid Other | Admitting: Physical Therapy

## 2021-05-11 ENCOUNTER — Telehealth: Payer: Self-pay | Admitting: Physical Therapy

## 2021-05-11 NOTE — Telephone Encounter (Signed)
PT called pt about this afternoon's appointment at 1400. PT spoke to pt, pt reported she was having very bad cramping today and unable to make appointment but reported she has been doing all stretches and pelvic floor relaxation techniques and thinks this is helping overall. Pt would like to call back to schedule final appointment.  Otelia Sergeant, PT, DPT 10/26/222:26 PM

## 2021-05-12 DIAGNOSIS — F4310 Post-traumatic stress disorder, unspecified: Secondary | ICD-10-CM | POA: Diagnosis not present

## 2021-05-17 ENCOUNTER — Ambulatory Visit: Payer: Medicaid Other | Admitting: Neurology

## 2021-05-23 DIAGNOSIS — F4310 Post-traumatic stress disorder, unspecified: Secondary | ICD-10-CM | POA: Diagnosis not present

## 2021-05-30 DIAGNOSIS — F4310 Post-traumatic stress disorder, unspecified: Secondary | ICD-10-CM | POA: Diagnosis not present

## 2021-06-01 ENCOUNTER — Other Ambulatory Visit: Payer: Self-pay

## 2021-06-01 ENCOUNTER — Ambulatory Visit: Payer: Medicaid Other | Attending: Family Medicine | Admitting: Physical Therapy

## 2021-06-01 DIAGNOSIS — R252 Cramp and spasm: Secondary | ICD-10-CM | POA: Insufficient documentation
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
## 2021-06-03 ENCOUNTER — Ambulatory Visit: Payer: Medicaid Other | Admitting: Family Medicine

## 2021-06-14 DIAGNOSIS — F4310 Post-traumatic stress disorder, unspecified: Secondary | ICD-10-CM | POA: Diagnosis not present

## 2021-06-15 ENCOUNTER — Encounter: Payer: Medicaid Other | Admitting: Physical Therapy

## 2021-06-16 ENCOUNTER — Ambulatory Visit (INDEPENDENT_AMBULATORY_CARE_PROVIDER_SITE_OTHER): Payer: Medicaid Other | Admitting: Family Medicine

## 2021-06-16 ENCOUNTER — Encounter: Payer: Self-pay | Admitting: Family Medicine

## 2021-06-16 ENCOUNTER — Other Ambulatory Visit: Payer: Self-pay

## 2021-06-16 ENCOUNTER — Ambulatory Visit: Payer: Medicaid Other

## 2021-06-16 VITALS — BP 102/70 | HR 82 | Ht 59.0 in | Wt 172.2 lb

## 2021-06-16 DIAGNOSIS — R519 Headache, unspecified: Secondary | ICD-10-CM

## 2021-06-16 DIAGNOSIS — G8929 Other chronic pain: Secondary | ICD-10-CM
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
## 2021-06-16 NOTE — Patient Instructions (Addendum)
Good to see you today.  Follow-up as needed.  Nortriptyline  Topamax  Emgality   Nurtec

## 2021-06-22 ENCOUNTER — Ambulatory Visit: Payer: Medicaid Other | Admitting: Obstetrics

## 2021-06-22 ENCOUNTER — Other Ambulatory Visit: Payer: Self-pay | Admitting: Obstetrics

## 2021-06-22 ENCOUNTER — Other Ambulatory Visit: Payer: Self-pay

## 2021-06-22 VITALS — BP 98/68 | HR 67 | Ht 59.0 in | Wt 168.4 lb

## 2021-06-22 DIAGNOSIS — H5340 Unspecified visual field defects: Secondary | ICD-10-CM | POA: Diagnosis not present

## 2021-06-22 DIAGNOSIS — F4310 Post-traumatic stress disorder, unspecified: Secondary | ICD-10-CM | POA: Diagnosis not present

## 2021-06-22 DIAGNOSIS — N6310 Unspecified lump in the right breast, unspecified quadrant: Secondary | ICD-10-CM

## 2021-06-22 DIAGNOSIS — N644 Mastodynia: Secondary | ICD-10-CM

## 2021-06-22 NOTE — Progress Notes (Signed)
Patient ID: Bailey Rose, female   DOB: April 05, 1996, 25 y.o.   MRN: [id number]  Chief Complaint  Patient presents with   Breast Pain    HPI Eleyna Brugh is a 25 y.o. female.  Complains of right breast pain.  Denies palpating breast masses.  Also c/o seeing floaters. HPI  Past Medical History:  Diagnosis Date   Asthma    Migraines    Polycystic ovarian syndrome    Sickle cell trait (HCC)     Past Surgical History:  Procedure Laterality Date   TONSILLECTOMY     WISDOM TOOTH EXTRACTION  2017    Family History  Problem Relation Age of Onset   Sickle cell anemia Mother    Cancer Maternal Grandmother        lung   Migraines Maternal Grandmother    Lupus Maternal Grandmother     Social History Social History   Tobacco Use   Smoking status: Former    Types: Cigarettes   Smokeless tobacco: Never  Vaping Use   Vaping Use: Never used  Substance Use Topics   Alcohol use: No    Alcohol/week: 0.0 standard drinks   Drug use: Not Currently    Types: Marijuana    Allergies  Allergen Reactions   Latex Itching    Reports itching and irritation with condom use.    Current Outpatient Medications  Medication Sig Dispense Refill   albuterol (VENTOLIN HFA) 108 (90 Base) MCG/ACT inhaler Inhale 1-2 puffs into the lungs every 6 (six) hours as needed for wheezing or shortness of breath. 1 g 0   No current facility-administered medications for this visit.    Review of Systems Review of Systems Constitutional: negative for fatigue and weight loss Respiratory: negative for cough and wheezing Cardiovascular: negative for chest pain, fatigue and palpitations Gastrointestinal: negative for abdominal pain and change in bowel habits Genitourinary:negative Integument/breast: positive for breast pain.  negative for nipple discharge Musculoskeletal: positive for myalgias Neurological: negative for gait problems and tremors Behavioral/Psych: negative for abusive relationship,  depression Endocrine: negative for temperature intolerance      Blood pressure 98/68, pulse 67, height 4\' 11"  (1.499 m), weight 168 lb 6.4 oz (76.4 kg), last menstrual period 06/07/2021.  Physical Exam Physical Exam General:   Alert and no distress  Skin:   no rash or abnormalities  Lungs:   clear to auscultation bilaterally  Heart:   regular rate and rhythm, S1, S2 normal, no murmur, click, rub or gallop  Breasts:   normal without suspicious masses, skin or nipple changes or axillary nodes     I have spent a total of 20 minutes of face-to-face time, excluding clinical staff time, reviewing notes and preparing to see patient, ordering tests and/or medications, and counseling the patient.   Data Reviewed Labs  Assessment     1. Breast pain, right Rx: - MM Digital Diagnostic Unilat R; Future - MM DIAG BREAST TOMO BILATERAL; Future  2. Visual field defects Rx: - Ambulatory referral to Ophthalmology     Plan   Follow up prn    06/09/2021, MD 06/22/2021 1:58 PM
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
## 2021-06-27 ENCOUNTER — Telehealth: Payer: Self-pay | Admitting: Neurology

## 2021-06-27 DIAGNOSIS — F4310 Post-traumatic stress disorder, unspecified: Secondary | ICD-10-CM | POA: Diagnosis not present

## 2021-06-27 NOTE — Telephone Encounter (Signed)
Patient called and said she has been having a headache since last Thursday.  She thinks it may be related to stress possibly.  Patient requesting Dr. Moises Blood help. She is aware he is off this afternoon.

## 2021-06-28 ENCOUNTER — Telehealth: Payer: Self-pay | Admitting: Family Medicine

## 2021-06-28 DIAGNOSIS — R519 Headache, unspecified: Secondary | ICD-10-CM

## 2021-06-28 DIAGNOSIS — F0781 Postconcussional syndrome: Secondary | ICD-10-CM

## 2021-06-28 NOTE — Telephone Encounter (Signed)
Pt called no answer no voice mail set up 

## 2021-06-28 NOTE — Telephone Encounter (Signed)
I am not optimistic that [facility] neurology will see you.  I am referring you to [place] or [facility].  I think you are more likely to be seen at either 1 of those locations.

## 2021-06-28 NOTE — Telephone Encounter (Signed)
Pt has seen [facility]/Dr. [name] and does not think they are "on the same page for the direction she wants to go in"  Pt would like to be referred to [facility].
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
## 2021-06-28 NOTE — Telephone Encounter (Signed)
Left pt a VM on her cell phone informing her.
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
## 2021-06-30 NOTE — Telephone Encounter (Signed)
Referral sent to [facility].  

## 2021-06-30 NOTE — Telephone Encounter (Signed)
Message received from [facility] at [facility]:  Thank you for the referral! Unfortunately as this is a second opinion, the patient will need to be referred to an academic center such as [facility], [facility] or [facility] neurology. We are the same level of care as [place] [facility] and there isn't anything we can do here that would be different to add to her care.   Left message for patient to call back to discuss where she would like the referral sent.

## 2021-07-05 DIAGNOSIS — F4310 Post-traumatic stress disorder, unspecified: Secondary | ICD-10-CM | POA: Diagnosis not present

## 2021-07-16 ENCOUNTER — Encounter: Payer: Self-pay | Admitting: Obstetrics

## 2021-07-22 ENCOUNTER — Other Ambulatory Visit: Payer: Self-pay

## 2021-07-22 ENCOUNTER — Ambulatory Visit
Admission: RE | Admit: 2021-07-22 | Discharge: 2021-07-22 | Disposition: A | Discharge status: Home / Self Care | Payer: Medicaid Other | Source: Ambulatory Visit | Attending: Obstetrics | Admitting: Obstetrics

## 2021-07-22 DIAGNOSIS — N6310 Unspecified lump in the right breast, unspecified quadrant: Secondary | ICD-10-CM

## 2021-07-26 DIAGNOSIS — F4310 Post-traumatic stress disorder, unspecified: Secondary | ICD-10-CM | POA: Diagnosis not present

## 2021-08-04 ENCOUNTER — Other Ambulatory Visit: Payer: Self-pay

## 2021-08-04 ENCOUNTER — Encounter: Payer: Self-pay | Admitting: Obstetrics

## 2021-08-04 ENCOUNTER — Ambulatory Visit: Payer: Medicaid Other | Admitting: Obstetrics

## 2021-08-04 VITALS — BP 107/71 | HR 69 | Wt 167.0 lb

## 2021-08-04 DIAGNOSIS — G43119 Migraine with aura, intractable, without status migrainosus: Secondary | ICD-10-CM

## 2021-08-04 DIAGNOSIS — F4310 Post-traumatic stress disorder, unspecified: Secondary | ICD-10-CM | POA: Diagnosis not present

## 2021-08-04 DIAGNOSIS — H5340 Unspecified visual field defects: Secondary | ICD-10-CM | POA: Diagnosis not present

## 2021-08-04 DIAGNOSIS — R21 Rash and other nonspecific skin eruption: Secondary | ICD-10-CM

## 2021-08-04 DIAGNOSIS — F3281 Premenstrual dysphoric disorder: Secondary | ICD-10-CM

## 2021-08-04 DIAGNOSIS — E282 Polycystic ovarian syndrome: Secondary | ICD-10-CM | POA: Diagnosis not present

## 2021-08-04 NOTE — Progress Notes (Signed)
Patient ID: Bailey Rose, female   DOB: 21-May-1996, 26 y.o.   MRN: [id number]  No chief complaint on file.   HPI Bailey Rose is a 26 y.o. female.  Complains of generalized rash after using Monistat for a yeast infection.  Took Benadryl, and is getting na slow resolution. HPI  Past Medical History:  Diagnosis Date   Asthma    Migraines    Polycystic ovarian syndrome    Sickle cell trait (HCC)     Past Surgical History:  Procedure Laterality Date   TONSILLECTOMY     WISDOM TOOTH EXTRACTION  2017    Family History  Problem Relation Age of Onset   Sickle cell anemia Mother    Cancer Maternal Grandmother        lung   Migraines Maternal Grandmother    Lupus Maternal Grandmother     Social History Social History   Tobacco Use   Smoking status: Former    Types: Cigarettes   Smokeless tobacco: Never  Vaping Use   Vaping Use: Never used  Substance Use Topics   Alcohol use: No    Alcohol/week: 0.0 standard drinks   Drug use: Not Currently    Types: Marijuana    Allergies  Allergen Reactions   Latex Itching    Reports itching and irritation with condom use.    Current Outpatient Medications  Medication Sig Dispense Refill   albuterol (VENTOLIN HFA) 108 (90 Base) MCG/ACT inhaler Inhale 1-2 puffs into the lungs every 6 (six) hours as needed for wheezing or shortness of breath. 1 g 0   No current facility-administered medications for this visit.    Review of Systems Review of Systems Constitutional: negative for fatigue and weight loss Respiratory: negative for cough and wheezing Cardiovascular: negative for chest pain, fatigue and palpitations Gastrointestinal: negative for abdominal pain and change in bowel habits Genitourinary:negative Integument/breast: negative for nipple discharge Musculoskeletal:negative for myalgias Neurological: negative for gait problems and tremors Behavioral/Psych: negative for abusive relationship, depression Endocrine:  negative for temperature intolerance      Blood pressure 107/71, pulse 69, weight 167 lb (75.8 kg).  Physical Exam Physical Exam General:   alert  Skin:   Generalized rash  Lungs:   clear to auscultation bilaterally  Heart:   regular rate and rhythm, S1, S2 normal, no murmur, click, rub or gallop    I have spent a total of 15 minutes of face-to-face time, excluding clinical staff time, reviewing notes and preparing to see patient, ordering tests and/or medications, and counseling the patient.   Data Reviewed Labs  Assessment     1. Maculopapular rash, generalized, resolving - continue Benadryl prn - offered a steroid taper, but she refused  2. PMDD (premenstrual dysphoric disorder) Rx: - Ambulatory referral to Endocrinology  3. PCOS (polycystic ovarian syndrome) - recent U/S was WNL's - offered SSRI treatment but she refused  4. Intractable migraine with aura without status migrainosus Rx: - Ambulatory referral to Ophthalmology  5. Visual field defects      Plan   Follow up prn  Orders Placed This Encounter  Procedures   Ambulatory referral to Endocrinology    Referral Priority:   Routine    Referral Type:   Consultation    Referral Reason:   Specialty Services Required    Number of Visits Requested:   1   Ambulatory referral to Ophthalmology    Referral Priority:   Routine    Referral Type:   Consultation    Referral  Reason:   Specialty Services Required    Requested Specialty:   Ophthalmology    Number of Visits Requested:   1      Brock Bad, MD 08/04/2021 3:19 PM

## 2021-08-04 NOTE — Progress Notes (Signed)
Pt states she used Monistat and shortly after broke out in rash- all over body. Pt has taken benadryl with not much relief.   Pt would like to discuss PCOS and recent u/s changes.  Pt states that PT told her she had symptoms of PMDD. Pt ?? Appt needed with Endocrinology.

## 2021-08-09 DIAGNOSIS — F4310 Post-traumatic stress disorder, unspecified: Secondary | ICD-10-CM | POA: Diagnosis not present

## 2021-08-17 DIAGNOSIS — F4310 Post-traumatic stress disorder, unspecified: Secondary | ICD-10-CM | POA: Diagnosis not present

## 2021-08-21 ENCOUNTER — Ambulatory Visit (HOSPITAL_COMMUNITY): Payer: Medicaid Other

## 2021-08-22 ENCOUNTER — Ambulatory Visit (HOSPITAL_COMMUNITY): Payer: Medicaid Other

## 2021-08-23 DIAGNOSIS — F4310 Post-traumatic stress disorder, unspecified: Secondary | ICD-10-CM | POA: Diagnosis not present

## 2021-08-24 ENCOUNTER — Other Ambulatory Visit: Payer: Self-pay

## 2021-08-24 ENCOUNTER — Other Ambulatory Visit (HOSPITAL_COMMUNITY)
Admission: RE | Admit: 2021-08-24 | Discharge: 2021-08-24 | Disposition: A | Discharge status: Home / Self Care | Payer: Medicaid Other | Source: Ambulatory Visit | Attending: Obstetrics | Admitting: Obstetrics

## 2021-08-24 ENCOUNTER — Ambulatory Visit (INDEPENDENT_AMBULATORY_CARE_PROVIDER_SITE_OTHER): Payer: Medicaid Other | Admitting: *Deleted

## 2021-08-24 VITALS — BP 108/75 | HR 73

## 2021-08-24 DIAGNOSIS — Z3200 Encounter for pregnancy test, result unknown: Secondary | ICD-10-CM

## 2021-08-24 DIAGNOSIS — Z113 Encounter for screening for infections with a predominantly sexual mode of transmission: Secondary | ICD-10-CM | POA: Insufficient documentation

## 2021-08-24 DIAGNOSIS — Z3202 Encounter for pregnancy test, result negative: Secondary | ICD-10-CM

## 2021-08-24 LAB — POCT URINE PREGNANCY: Preg Test, Ur: NEGATIVE

## 2021-08-24 NOTE — Progress Notes (Signed)
SUBJECTIVE:  26 y.o. female who desires a STI screen. Reports vaginal discharge. Denies bleeding or significant pelvic pain. No UTI symptoms. Denies history of known exposure to STD.  Patient's last menstrual period was 08/01/2021 (exact date).  OBJECTIVE:  She appears well.   ASSESSMENT:  STI Screen   PLAN:  Pt offered STI blood screening-requested GC, chlamydia, and trichomonas probe sent to lab.  Treatment: To be determined once lab results are received.  Pt follow up as needed.   Bailey Rose presents today for UPT. She has no unusual complaints. LMP: 08/01/21    OBJECTIVE: Appears well, in no apparent distress.  OB History     Gravida  3   Para  0   Term  0   Preterm  0   AB  2   Living  0      SAB  2   IAB  0   Ectopic  0   Multiple  0   Live Births  0          Home UPT Result: NA In-Office UPT result: Negative I have reviewed the patient's medical, obstetrical, social, and family histories, and medications.   ASSESSMENT: Negative pregnancy test  PLAN Negative UPT. Return for positive home UPT, missed LMP, or signs of pregnancy.

## 2021-08-25 LAB — CERVICOVAGINAL ANCILLARY ONLY
Bacterial Vaginitis (gardnerella): NEGATIVE
Candida Glabrata: NEGATIVE
Candida Vaginitis: POSITIVE — AB
Chlamydia: NEGATIVE
Comment: NEGATIVE
Comment: NEGATIVE
Comment: NEGATIVE
Comment: NEGATIVE
Comment: NEGATIVE
Comment: NORMAL
Neisseria Gonorrhea: POSITIVE — AB
Trichomonas: NEGATIVE

## 2021-08-26 ENCOUNTER — Ambulatory Visit (HOSPITAL_COMMUNITY)
Admission: EM | Admit: 2021-08-26 | Discharge: 2021-08-26 | Disposition: A | Discharge status: Home / Self Care | Payer: Medicaid Other | Attending: Family Medicine | Admitting: Family Medicine

## 2021-08-26 ENCOUNTER — Telehealth: Payer: Self-pay

## 2021-08-26 ENCOUNTER — Encounter: Payer: Self-pay | Admitting: Obstetrics and Gynecology

## 2021-08-26 ENCOUNTER — Other Ambulatory Visit: Payer: Self-pay

## 2021-08-26 ENCOUNTER — Other Ambulatory Visit: Payer: Self-pay | Admitting: Obstetrics

## 2021-08-26 DIAGNOSIS — A539 Syphilis, unspecified: Secondary | ICD-10-CM

## 2021-08-26 DIAGNOSIS — A530 Latent syphilis, unspecified as early or late: Secondary | ICD-10-CM

## 2021-08-26 DIAGNOSIS — A549 Gonococcal infection, unspecified: Secondary | ICD-10-CM

## 2021-08-26 LAB — RPR: RPR Ser Ql: REACTIVE — AB

## 2021-08-26 LAB — HEPATITIS C ANTIBODY: Hep C Virus Ab: 0.1 s/co ratio (ref 0.0–0.9)

## 2021-08-26 LAB — RPR, QUANT+TP ABS (REFLEX)
Rapid Plasma Reagin, Quant: 1:32 {titer} — ABNORMAL HIGH
T Pallidum Abs: REACTIVE — AB

## 2021-08-26 LAB — HIV ANTIBODY (ROUTINE TESTING W REFLEX): HIV Screen 4th Generation wRfx: NONREACTIVE

## 2021-08-26 LAB — HEPATITIS B SURFACE ANTIGEN: Hepatitis B Surface Ag: NEGATIVE

## 2021-08-26 MED ORDER — CEFTRIAXONE SODIUM 500 MG IJ SOLR
500.0000 mg | Freq: Once | INTRAMUSCULAR | Status: AC
  Administered 2021-08-26: 500 mg via INTRAMUSCULAR

## 2021-08-26 MED ORDER — PENICILLIN G BENZATHINE 1200000 UNIT/2ML IM SUSY
PREFILLED_SYRINGE | INTRAMUSCULAR | Status: AC
  Filled 2021-08-26: qty 4

## 2021-08-26 MED ORDER — FLUCONAZOLE 150 MG PO TABS: ORAL_TABLET | ORAL | 0 refills | Status: DC

## 2021-08-26 MED ORDER — CEFTRIAXONE SODIUM 500 MG IJ SOLR
INTRAMUSCULAR | Status: AC
  Filled 2021-08-26: qty 500

## 2021-08-26 MED ORDER — LIDOCAINE HCL (PF) 1 % IJ SOLN
INTRAMUSCULAR | Status: AC
  Filled 2021-08-26: qty 2

## 2021-08-26 MED ORDER — PENICILLIN G BENZATHINE 1200000 UNIT/2ML IM SUSY
2.4000 10*6.[IU] | PREFILLED_SYRINGE | Freq: Once | INTRAMUSCULAR | Status: AC
  Administered 2021-08-26: 2.4 10*6.[IU] via INTRAMUSCULAR

## 2021-08-26 NOTE — Telephone Encounter (Signed)
Attempted to contact about results, treatment and referral. No answer, left vm to call office.

## 2021-08-26 NOTE — ED Notes (Addendum)
Patient presents to [facility] for treatment of labs drawn at [facility].  Positive Syphilis and Gonorrhea.  Per protocol, Gonorrhea will need treatment with IM Rocephin 500mg .  For Syphilis, will have provider on site review for appropriate treatment.  , CMA aware of plan and protocols and will review with provider on site.

## 2021-08-26 NOTE — ED Triage Notes (Signed)
Pt presents for STD treatment.   Pt states she was seen at her OBGYN office and tested positive for Gonorrhea and Syphilis.   Pt states the office closed at 12 today and is here for tx.

## 2021-08-29 ENCOUNTER — Telehealth: Payer: Self-pay

## 2021-08-29 DIAGNOSIS — F4310 Post-traumatic stress disorder, unspecified: Secondary | ICD-10-CM | POA: Diagnosis not present

## 2021-08-29 NOTE — Telephone Encounter (Signed)
S/w patient about results and treatment. Pt states that she went to urgent care over the weekend and was treated for GC and RPR. Pt states that she was not given any follow up instructions. Advised pt that information would be relayed to [facility]. Contacted HD and left vm to follow up

## 2021-08-30 ENCOUNTER — Telehealth: Payer: Self-pay

## 2021-08-30 NOTE — Telephone Encounter (Signed)
Patient called stating that she was confused about her treatment for RPR. Patient states that she was treated at the urgent care for RPR and GC. Patient states that she has spoken with her case worker from the state and was advised that she only needed one dose on Bicillin at this time. Patient states that she has an appt to follow up with her state case worker at 10 am and was told to follow up with infectious disease as recommended.

## 2021-08-30 NOTE — Telephone Encounter (Signed)
Brooks, DIS called office to follow up on RPR test done by OBGYN. State that patient was treated for syphillis at [facility] on 2/10. Received Bicillin x1. Per DIS they are satisfied with patient treatment plan. Does not recommend additional treatment at this time.  Juanita Laster, RMA

## 2021-09-01 DIAGNOSIS — F4310 Post-traumatic stress disorder, unspecified: Secondary | ICD-10-CM | POA: Diagnosis not present

## 2021-09-12 ENCOUNTER — Other Ambulatory Visit: Payer: Self-pay

## 2021-09-12 ENCOUNTER — Encounter: Payer: Self-pay | Admitting: Family

## 2021-09-12 ENCOUNTER — Ambulatory Visit: Payer: Medicaid Other | Admitting: Family

## 2021-09-12 VITALS — BP 105/71 | HR 78 | Temp 97.9°F | Ht 60.0 in | Wt 166.0 lb

## 2021-09-12 DIAGNOSIS — A549 Gonococcal infection, unspecified: Secondary | ICD-10-CM

## 2021-09-12 DIAGNOSIS — A539 Syphilis, unspecified: Secondary | ICD-10-CM | POA: Diagnosis not present

## 2021-09-12 NOTE — Patient Instructions (Addendum)
Nice to see you.  Plan for follow up with your PCP/GYN in the next 3 months to recheck lab work.   Have a great day and stay safe!

## 2021-09-12 NOTE — Assessment & Plan Note (Signed)
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 We discussed that it can take time for the RPR titer to come down and would need to be retested in about 3 months. Discussed importance of safe sexual practice and condom use. Briefly introduced PrEP which she is not interested in. Follow up with PCP or GYN and ID as needed.

## 2021-09-12 NOTE — Assessment & Plan Note (Signed)
Bailey Rose was successfully treated with Ceftriaxone. No further treatment is needed at this time.

## 2021-09-12 NOTE — Progress Notes (Signed)
Subjective:    Patient ID: Bailey Rose, female    DOB: 1996-06-26, 26 y.o.   MRN: [id number]  Chief Complaint  Patient presents with   New Patient (Initial Visit)    HPI:  Bailey Rose is a 26 y.o. female with previous medical history of PCOS presenting today for positive RPR test.   Bailey Rose was initially seen on 08/04/21 for maculopapular rash and was treated with benedryl with concern for reaction to Monistat. Seen on 08/24/21 for STI testing and found to have gonorrhea and positive RPR titer of 1:32. Previous negative RPR titer was 02/24/21. Treated with ceftriaxone and received 2.4 million units of Bicillin once.  Bailey Rose has been doing well since she received treatment with no further symptoms. Has since been on a cruise and has questions if medications can increase sun sensitivity and possibly cause diarrhea.    Allergies  Allergen Reactions   Latex Itching    Reports itching and irritation with condom use.      Outpatient Medications Prior to Visit  Medication Sig Dispense Refill   albuterol (VENTOLIN HFA) 108 (90 Base) MCG/ACT inhaler Inhale 1-2 puffs into the lungs every 6 (six) hours as needed for wheezing or shortness of breath. 1 g 0   fluconazole (DIFLUCAN) 150 MG tablet 1 tab po x 1, may repeat in 3 days (Patient not taking: Reported on 09/12/2021) 2 tablet 0   No facility-administered medications prior to visit.     Past Medical History:  Diagnosis Date   Asthma    Migraines    Polycystic ovarian syndrome    Sickle cell trait (HCC)      Past Surgical History:  Procedure Laterality Date   TONSILLECTOMY     WISDOM TOOTH EXTRACTION  2017       Review of Systems  Constitutional:  Negative for appetite change, chills, diaphoresis, fatigue, fever and unexpected weight change.  Eyes:        Negative for acute change in vision  Respiratory:  Negative for chest tightness, shortness of breath and wheezing.   Cardiovascular:  Negative for  chest pain.  Gastrointestinal:  Negative for diarrhea, nausea and vomiting.  Genitourinary:  Negative for dysuria, pelvic pain and vaginal discharge.  Musculoskeletal:  Negative for neck pain and neck stiffness.  Skin:  Negative for rash.  Neurological:  Negative for seizures, syncope, weakness and headaches.  Hematological:  Negative for adenopathy. Does not bruise/bleed easily.  Psychiatric/Behavioral:  Negative for hallucinations.      Objective:    BP 105/71    Pulse 78    Temp 97.9 F (36.6 C) (Oral)    Ht 5' (1.524 m)    Wt 166 lb (75.3 kg)    SpO2 98%    BMI 32.42 kg/m  Nursing note and vital signs reviewed.  Physical Exam Constitutional:      General: She is not in acute distress.    Appearance: She is well-developed.  Eyes:     Conjunctiva/sclera: Conjunctivae normal.  Cardiovascular:     Rate and Rhythm: Normal rate and regular rhythm.     Heart sounds: Normal heart sounds. No murmur heard.   No friction rub. No gallop.  Pulmonary:     Effort: Pulmonary effort is normal. No respiratory distress.     Breath sounds: Normal breath sounds. No wheezing or rales.  Chest:     Chest wall: No tenderness.  Abdominal:     General: Bowel sounds are normal.  Palpations: Abdomen is soft.     Tenderness: There is no abdominal tenderness.  Musculoskeletal:     Cervical back: Neck supple.  Lymphadenopathy:     Cervical: No cervical adenopathy.  Skin:    General: Skin is warm and dry.     Findings: No rash.  Neurological:     Mental Status: She is alert and oriented to person, place, and time.  Psychiatric:        Behavior: Behavior normal.        Thought Content: Thought content normal.        Judgment: Judgment normal.     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 No further treatment is needed at this time.       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 We discussed that it can take time for the RPR titer to come down and would need to be retested in about 3 months. Discussed importance of safe sexual practice and condom use. Briefly introduced PrEP which she is not interested in. Follow up with PCP or GYN and ID as needed.         I am having Bailey Rose maintain her albuterol and fluconazole.   Follow-up: Follow up with ID as needed.   Marcos Eke, MSN, FNP-C Nurse Practitioner [facility] Main number: [id number]

## 2021-09-20 DIAGNOSIS — F4310 Post-traumatic stress disorder, unspecified: Secondary | ICD-10-CM | POA: Diagnosis not present

## 2021-09-29 ENCOUNTER — Ambulatory Visit: Payer: Medicaid Other | Admitting: Pharmacist

## 2021-09-30 ENCOUNTER — Other Ambulatory Visit: Payer: Self-pay

## 2021-09-30 ENCOUNTER — Ambulatory Visit (INDEPENDENT_AMBULATORY_CARE_PROVIDER_SITE_OTHER): Payer: Medicaid Other | Admitting: Pharmacist

## 2021-09-30 DIAGNOSIS — Z113 Encounter for screening for infections with a predominantly sexual mode of transmission: Secondary | ICD-10-CM

## 2021-09-30 DIAGNOSIS — Z8619 Personal history of other infectious and parasitic diseases: Secondary | ICD-10-CM | POA: Diagnosis not present

## 2021-09-30 NOTE — Progress Notes (Signed)
? ?09/30/2021 ? ?HPI: Ladell Bey is a 26 y.o. female who presents to the [facility] today for STI testing. ? ?Patient Active Problem List  ? Diagnosis Date Noted  ? Syphilis 09/12/2021  ? Gonorrhea 08/26/2021  ? 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 Premenstrual syndrome 05/01/2021  ? Fatigue 05/01/2021  ? Constipation 01/08/2020  ? Delayed gastric emptying 01/08/2020  ? PCOS (polycystic ovarian syndrome) 03/18/2015  ? Acne cystica 02/10/2015  ? Dysmenorrhea 06/24/2013  ? ? ?Patient's Medications  ?New Prescriptions  ? No medications on file  ?Previous Medications  ? ALBUTEROL (VENTOLIN HFA) 108 (90 BASE) MCG/ACT INHALER    Inhale 1-2 puffs into the lungs every 6 (six) hours as needed for wheezing or shortness of breath.  ? FLUCONAZOLE (DIFLUCAN) 150 MG TABLET    1 tab po x 1, may repeat in 3 days  ?Modified Medications  ? No medications on file  ?Discontinued Medications  ? No medications on file  ? ? ?Allergies: ?Allergies  ?Allergen Reactions  ? Latex Itching  ?  Reports itching and irritation with condom use.  ? ? ?Past Medical History: ?Past Medical History:  ?Diagnosis Date  ? Asthma   ? Migraines   ? Polycystic ovarian syndrome   ? Sickle cell trait (HCC)   ? ? ?Social History: ?Social History  ? ?Socioeconomic History  ? Marital status: Single  ?  Spouse name: Not on file  ? Number of children: Not on file  ? Years of education: Not on file  ? Highest education level: Not on file  ?Occupational History  ? Not on file  ?Tobacco Use  ? Smoking status: Some Days  ?  Packs/day: 0.10  ?  Types: Cigarettes, E-cigarettes  ? Smokeless tobacco: Never  ? Tobacco comments:  ?  Swaps between cigarettes and vapes. Unsure how long it takes her to go through a pack. States she smokes about 3 cigarettes a week. States that she is cutting back.  ?Vaping Use  ? Vaping Use: Never used  ?Substance and Sexual Activity  ? Alcohol use: Yes  ?  Comment: States socially/ while on vacation  ? Drug use: Not Currently  ?  Types:  Marijuana  ? Sexual activity: Yes  ?  Partners: Male  ?  Birth control/protection: Condom  ?  Comment: Non-latex condoms  ?Other Topics Concern  ? Not on file  ?Social History Narrative  ? Not on file  ? ?Social Determinants of Health  ? ?Financial Resource Strain: Not on file  ?Food Insecurity: Not on file  ?Transportation Needs: Not on file  ?Physical Activity: Not on file  ?Stress: Not on file  ?Social Connections: Not on file  ? ?Assessment: ?Tvisha presents for STI screening. Tested positive for gonorrhea and syphilis (RPR 1:32) on 08/24/21. Treated with ceftriaxone and Bicillin x1. Today, she denies any new or worsening symptoms, other than dryness and irritation with Latex condoms (Latex allergy noted in chart). Recommended patient try lamb skin condoms instead.  ? ?She states she has only had 1 partner since testing positive, they've had sex 3 times and have used a condom 2 out of the 3 times. They have also had oral sex. She endorses that her partner was also tested and treated for syphilis and gonorrhea. She does express frustrations with her partner. Given that she has had recent unprotected sex with her partner who also tested positive for STIs, will re-test for gonorrhea, chlamydia, and trichomonas.  ? ?Patient was requesting RPR  retesting but discussed that after syphilis treatment, we check RPR at 6, 12, and 24 months. Scheduled her a follow-up in July for repeat RPR testing.  ? ?Plan: ?- STI screening: urine/pharyngeal GC/CT swabs for cytology today ?- F/u results to see if treatment needed ?- F/u with Marchelle Folks on 01/19/22 for repeat RPR ? ?Valeda Malm, Pharm.D. ?PGY-1 Pharmacy Resident ?09/30/2021 10:44 AM ? ?

## 2021-10-01 LAB — TRICHOMONAS VAGINALIS, PROBE AMP: Trichomonas vaginalis RNA: NOT DETECTED

## 2021-10-01 LAB — C. TRACHOMATIS/N. GONORRHOEAE RNA
C. trachomatis RNA, TMA: NOT DETECTED
N. gonorrhoeae RNA, TMA: NOT DETECTED

## 2021-10-01 LAB — GC/CHLAMYDIA PROBE, AMP (THROAT)
Chlamydia trachomatis RNA: NOT DETECTED
Neisseria gonorrhoeae RNA: NOT DETECTED

## 2021-10-26 DIAGNOSIS — G43009 Migraine without aura, not intractable, without status migrainosus: Secondary | ICD-10-CM | POA: Diagnosis not present

## 2021-10-26 DIAGNOSIS — F0781 Postconcussional syndrome: Secondary | ICD-10-CM | POA: Diagnosis not present
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
## 2021-11-07 ENCOUNTER — Ambulatory Visit: Payer: Medicaid Other | Admitting: Neurology

## 2021-11-07 VITALS — BP 104/71 | HR 87 | Ht 60.0 in | Wt 162.6 lb

## 2021-11-07 DIAGNOSIS — G43709 Chronic migraine without aura, not intractable, without status migrainosus: Secondary | ICD-10-CM

## 2021-11-07 DIAGNOSIS — Z87820 Personal history of traumatic brain injury: Secondary | ICD-10-CM

## 2021-11-07 DIAGNOSIS — M542 Cervicalgia: Secondary | ICD-10-CM | POA: Diagnosis not present

## 2021-11-07 NOTE — Patient Instructions (Addendum)
Will refer you back for physical therapy for neck pain at [facility] ?Continue supplements ?At this point, there isn't anything else I can offer other than prescription medication ?

## 2021-11-10 ENCOUNTER — Encounter: Payer: Self-pay | Admitting: Physical Therapy

## 2021-11-10 ENCOUNTER — Ambulatory Visit: Payer: Medicaid Other | Attending: Neurology | Admitting: Physical Therapy

## 2021-11-10 DIAGNOSIS — R293 Abnormal posture: Secondary | ICD-10-CM | POA: Insufficient documentation

## 2021-11-10 DIAGNOSIS — Z87820 Personal history of traumatic brain injury: Secondary | ICD-10-CM | POA: Diagnosis not present

## 2021-11-10 DIAGNOSIS — R252 Cramp and spasm: Secondary | ICD-10-CM | POA: Diagnosis present

## 2021-11-10 DIAGNOSIS — G43709 Chronic migraine without aura, not intractable, without status migrainosus: Secondary | ICD-10-CM | POA: Diagnosis not present

## 2021-11-10 DIAGNOSIS — M542 Cervicalgia: Secondary | ICD-10-CM | POA: Diagnosis present
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
## 2021-11-21 ENCOUNTER — Ambulatory Visit: Payer: Medicaid Other | Attending: Neurology | Admitting: Physical Therapy

## 2021-11-21 ENCOUNTER — Encounter: Payer: Self-pay | Admitting: Physical Therapy

## 2021-11-21 DIAGNOSIS — R293 Abnormal posture: Secondary | ICD-10-CM | POA: Insufficient documentation

## 2021-11-21 DIAGNOSIS — M542 Cervicalgia: Secondary | ICD-10-CM | POA: Diagnosis present

## 2021-11-21 NOTE — Therapy (Signed)
[place] [facility] ?[address] ?[place], [place], [address] ?Phone: [phone]   Fax:  [phone] ? ?Physical Therapy Treatment ? ?Patient Details  ?Name: Bailey Rose ?MRN: [id number] ?Date of Birth: [birth year] ?Referring Provider (PT): Shon Millet ? ? ?Encounter Date: 11/21/2021 ? ? PT End of Session - 11/21/21 1629   ? ? Visit Number 2   ? Authorization Time Period 11/10/21 to 12/22/21   ? PT Start Time 1557   ? PT Stop Time 1630   ? PT Time Calculation (min) 33 min   ? Activity Tolerance Patient tolerated treatment well   ? Behavior During Therapy [facility] for tasks assessed/performed   ? ?  ?  ? ?  ? ? ?Past Medical History:  ?Diagnosis Date  ? Asthma   ? Migraines   ? Polycystic ovarian syndrome   ? Sickle cell trait (HCC)   ? ? ?Past Surgical History:  ?Procedure Laterality Date  ? TONSILLECTOMY    ? WISDOM TOOTH EXTRACTION  2017  ? ? ?There were no vitals filed for this visit. ? ? Subjective Assessment - 11/21/21 1608   ? ? Subjective just migraines, mainly get it for the sun or anything that is too bright.   ? Currently in Pain? No/denies   ? ?  ?  ? ?  ? ? ? ? ? ? ? ? ? ? ? ? ? ? ? ? ? ? ? ? [facility] Adult PT Treatment/Exercise - 11/21/21 0001   ? ?  ? Neck Exercises: Standing  ? Other Standing Exercises shoulder Ext red 2x10   ?  ? Neck Exercises: Seated  ? Neck Retraction 10 reps;3 secs   ? Lateral Flexion 5 reps;Left;Right   ? Other Seated Exercise Rows red 2x10   ? Other Seated Exercise ER red 2x10   ?  ? Manual Therapy  ? Manual Therapy Soft tissue mobilization   ? Soft tissue mobilization upper traps and cervical pspinals   ? ?  ?  ? ?  ? ? ? Trigger Point Dry Needling - 11/21/21 0001   ? ? Consent Given? Yes   ? Education Handout Provided Yes   ? Muscles Treated Head and Neck Upper trapezius;Suboccipitals   ? Upper Trapezius Response Twitch reponse elicited;Palpable increased muscle length   ? Suboccipitals Response Twitch response elicited;Palpable increased  muscle length   ? ?  ?  ? ?  ? ? ? ? ? ? ? ? ? ? PT Short Term Goals - 11/10/21 1014   ? ?  ? PT SHORT TERM GOAL #1  ? Title Will be independent with appropriate progressive HEP   ? Time 3   ? Period Weeks   ? Status New   ? Target Date 12/01/21   ? ?  ?  ? ?  ? ? ? ? PT Long Term Goals - 11/10/21 1015   ? ?  ? PT LONG TERM GOAL #1  ? Title Will demonstrate at least at 50% improvement in cervical mm spasms and trigger points   ? Time 6   ? Period Weeks   ? Status New   ? Target Date 12/22/21   ?  ? PT LONG TERM GOAL #2  ? Title Will demonstrate better understanding of postural and biomechanics   ? Time 6   ? Period Weeks   ? Status New   ?  ? PT LONG TERM GOAL #3  ? Title Migranes to have  improved in intensity by 50% and will be 30% less frequent   ? Time 6   ? Period Weeks   ? Status New   ?  ? PT LONG TERM GOAL #4  ? Title Pain from migraines to be no worse than 5/10   ? Time 6   ? Period Weeks   ? Status New   ? ?  ?  ? ?  ? ? ? ? ? ? ? ? Plan - 11/21/21 1631   ? ? Clinical Impression Statement Pt arrives felling well. She reports no pain today. Pt stated she only get migraines from the sun or when exposed to bright areas. Lead PT assisted in treatment session providing DN. She did well completing all exercise interventions. Postural cue required with seated rows. no reports of increase pain with activities. Tactile cue needed to prevent cervical extension with cervical retractions.   ? Personal Factors and Comorbidities Past/Current Experience;Time since onset of injury/illness/exacerbation;Social Background   ? Examination-Activity Limitations Sleep;Other   ? Examination-Participation Restrictions Occupation;Community Activity;Driving;Yard Work;Laundry;Interpersonal Relationship;Shop   ? PT Frequency 1x / week   ? PT Duration 6 weeks   ? PT Treatment/Interventions ADLs/Self Care Home Management;Cryotherapy;Electrical Stimulation;Iontophoresis 4mg /ml Dexamethasone;Moist Heat;Traction;Ultrasound;Functional  mobility training;Therapeutic activities;Therapeutic exercise;Patient/family education;Manual techniques;Passive range of motion;Dry needling;Taping   ? PT Next Visit Plan postural training and strengthening, STM. Wants DN every session if possible   ? PT Home Exercise Plan [id number] Had to reprint HEP   ? ?  ?  ? ?  ? ? ?Patient will benefit from skilled therapeutic intervention in order to improve the following deficits and impairments:  Decreased range of motion, Increased fascial restricitons, Increased muscle spasms, Pain, Impaired perceived functional ability, Impaired flexibility, Improper body mechanics, Postural dysfunction ? ?Visit Diagnosis: ?Abnormal posture ? ?Cervicalgia ? ? ? ? ?Problem List ?Patient Active Problem List  ? Diagnosis Date Noted  ? Syphilis 09/12/2021  ? Gonorrhea 08/26/2021  ? 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 Premenstrual syndrome 05/01/2021  ? Fatigue 05/01/2021  ? Constipation 01/08/2020  ? Delayed gastric emptying 01/08/2020  ? PCOS (polycystic ovarian syndrome) 03/18/2015  ? Acne cystica 02/10/2015  ? Dysmenorrhea 06/24/2013  ? ? ?14/03/2013, PTA ?11/21/2021, 4:33 PM ? ?[place] ?[facility] ?[address] ?[place], [place], [place] ?Phone: [phone]   Fax:  [phone] ? ?Name: Bailey Rose ?MRN: [id number] ?Date of Birth: [birth year] ? ? ? ?

## 2021-11-21 NOTE — Patient Instructions (Signed)

## 2021-11-28 ENCOUNTER — Ambulatory Visit: Payer: Medicaid Other | Admitting: Physical Therapy

## 2021-12-05 ENCOUNTER — Ambulatory Visit: Payer: Medicaid Other | Admitting: Physical Therapy

## 2021-12-13 ENCOUNTER — Telehealth: Payer: Self-pay | Admitting: Neurology

## 2021-12-13 NOTE — Telephone Encounter (Signed)
Patient called wanting to know how many PT appointments she should expect were approved.

## 2021-12-13 NOTE — Telephone Encounter (Signed)
Advised patient, PT should be the one to discussed how many visit they want.  They will send [place] a order to sign.   Patient will call and see how many visits she needs and let us know if a order needs to be signed.

## 2021-12-19 ENCOUNTER — Ambulatory Visit: Payer: Medicaid Other | Attending: Family Medicine | Admitting: Physical Therapy

## 2021-12-19 ENCOUNTER — Encounter: Payer: Self-pay | Admitting: Physical Therapy

## 2021-12-19 DIAGNOSIS — R252 Cramp and spasm: Secondary | ICD-10-CM | POA: Diagnosis present

## 2021-12-19 DIAGNOSIS — M542 Cervicalgia: Secondary | ICD-10-CM | POA: Insufficient documentation

## 2021-12-19 DIAGNOSIS — R293 Abnormal posture: Secondary | ICD-10-CM | POA: Insufficient documentation
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
## 2022-01-19 ENCOUNTER — Other Ambulatory Visit (HOSPITAL_COMMUNITY)
Admission: RE | Admit: 2022-01-19 | Discharge: 2022-01-19 | Disposition: A | Discharge status: Home / Self Care | Payer: Medicaid Other | Source: Ambulatory Visit | Attending: Infectious Disease | Admitting: Infectious Disease

## 2022-01-19 ENCOUNTER — Other Ambulatory Visit: Payer: Self-pay

## 2022-01-19 ENCOUNTER — Ambulatory Visit (INDEPENDENT_AMBULATORY_CARE_PROVIDER_SITE_OTHER): Payer: Medicaid Other | Admitting: Pharmacist

## 2022-01-19 DIAGNOSIS — Z113 Encounter for screening for infections with a predominantly sexual mode of transmission: Secondary | ICD-10-CM

## 2022-01-19 NOTE — Progress Notes (Signed)
Date:  01/19/2022   HPI: Bailey Rose is a 26 y.o. female who presents to the [facility] pharmacy clinic for syphilis follow-up.  Insured   [x]    Uninsured  []    Patient Active Problem List   Diagnosis Date Noted   Syphilis 09/12/2021   Gonorrhea 08/26/2021   Positive RPR test 08/26/2021   Premenstrual syndrome 05/01/2021   Fatigue 05/01/2021   Constipation 01/08/2020   Delayed gastric emptying 01/08/2020   PCOS (polycystic ovarian syndrome) 03/18/2015   Acne cystica 02/10/2015   Dysmenorrhea 06/24/2013    Patient's Medications  New Prescriptions   No medications on file  Previous Medications   MAGNESIUM OXIDE (MAG-OX) 400 MG TABLET    Take 400 mg by mouth daily.   NON FORMULARY    CamuCamu- OTC   RIBOFLAVIN 400 MG CAPS    Take by mouth.   TURMERIC (QC TUMERIC COMPLEX) 500 MG CAPS    Take by mouth.  Modified Medications   No medications on file  Discontinued Medications   No medications on file    Allergies: Allergies  Allergen Reactions   Latex Itching    Reports itching and irritation with condom use.    Past Medical History: Past Medical History:  Diagnosis Date   Asthma    Migraines    Polycystic ovarian syndrome    Sickle cell trait (HCC)     Social History: Social History   Socioeconomic History   Marital status: Single    Spouse name: Not on file   Number of children: Not on file   Years of education: Not on file   Highest education level: Not on file  Occupational History   Not on file  Tobacco Use   Smoking status: Some Days    Packs/day: 0.10    Types: Cigarettes, E-cigarettes   Smokeless tobacco: Never   Tobacco comments:    Swaps between cigarettes and vapes. Unsure how long it takes her to go through a pack. States she smokes about 3 cigarettes a week. States that she is cutting back.  Vaping Use   Vaping Use: Never used  Substance and Sexual Activity   Alcohol use: Yes    Comment: States socially/ while on vacation   Drug use: Not  Currently    Types: Marijuana   Sexual activity: Yes    Partners: Male    Birth control/protection: Condom    Comment: Non-latex condoms  Other Topics Concern   Not on file  Social History Narrative   Not on file   Social Determinants of Health   Financial Resource Strain: Not on file  Food Insecurity: Food Insecurity Present (11/26/2019)   Hunger Vital Sign    Worried About Running Out of Food in the Last Year: Never true    Ran Out of Food in the Last Year: Often true  Transportation Needs: Unmet Transportation Needs (11/26/2019)   PRAPARE - 01/26/2020 (Medical): Yes    Lack of Transportation (Non-Medical): Yes  Physical Activity: Not on file  Stress: Not on file  Social Connections: Not on file        No data to display          Labs:  SCr: Lab Results  Component Value Date   CREATININE 0.69 12/07/2019   CREATININE 0.74 11/26/2019   CREATININE 0.62 03/13/2019   CREATININE 0.70 05/22/2018   CREATININE 0.74 07/04/2017   HIV Lab Results  Component Value Date   HIV  Non Reactive 08/24/2021   HIV Non Reactive 02/24/2021   HIV Non Reactive 03/13/2019   HIV Non Reactive 11/29/2017   HIV Non Reactive 07/04/2017   Hepatitis B Lab Results  Component Value Date   HEPBSAG Negative 08/24/2021   Hepatitis C No results found for: "HEPCAB", "HCVRNAPCRQN" Hepatitis A No results found for: "HAV" RPR and STI Lab Results  Component Value Date   LABRPR Reactive (A) 08/24/2021   LABRPR Non Reactive 02/24/2021   LABRPR NON REACTIVE 03/13/2019   LABRPR Non Reactive 11/29/2017   LABRPR Non Reactive 07/04/2017    STI Results GC GC CT CT  08/24/2021  4:05 PM Positive   Negative    01/28/2021 11:30 AM Negative   Negative    12/05/2019  9:13 PM Negative   Negative    09/16/2019 12:00 AM Negative   Negative    04/25/2019  9:19 AM Negative   Negative    04/03/2019 12:00 AM Negative   Negative    03/13/2019 12:00 AM Negative   Negative     04/16/2018 12:00 AM Negative   Negative    02/09/2018 12:00 AM Negative   Negative    11/29/2017 12:00 AM Negative   Negative    07/04/2017 12:00 AM Negative   Negative    04/02/2017 12:00 AM Negative   Negative    10/28/2013  4:41 PM  NEGATIVE   NEGATIVE   04/15/2013 12:32 PM  NEGATIVE   NEGATIVE     Assessment: Bailey Rose presents to clinic today for syphilis follow-up. 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 She presented with a rash in January; this certainly could have been presentation of secondary syphilis that progressed to early latent in February or could have been due to the use of latex condoms. She has since been using lamb skin condoms without any further rash or irritation. Will recheck RPR today as it has been nearly 6 months since initial testing.   She has been sexually active since her last visit and would like full STI screening. Will check HIV antibody along with GC/CT swabs for cytology. She has participated in oral sex since her last visit but denies any rectal activity. Reviewed PrEP options with her again, and she would like to read more about it. She is concerned about starting new medications that can cause side effects. Reviewed Truvada and Apretude, and she would be more interested in Apretude. Provided written counseling for her to review and requested she reach out to [place] if she would like to pursue the injection.   Plan: Check HIV antibody, RPR, and urine/oral cytologies Follow-up as needed with results and PrEP interest  Margarite Gouge, PharmD, CPP Clinical Pharmacist Practitioner Infectious Diseases Clinical Pharmacist [facility] 01/19/2022, 11:05 AM

## 2022-01-20 LAB — CYTOLOGY, (ORAL, ANAL, URETHRAL) ANCILLARY ONLY
Chlamydia: NEGATIVE
Comment: NEGATIVE
Comment: NORMAL
Neisseria Gonorrhea: NEGATIVE

## 2022-01-20 LAB — URINE CYTOLOGY ANCILLARY ONLY
Chlamydia: NEGATIVE
Comment: NEGATIVE
Comment: NORMAL
Neisseria Gonorrhea: NEGATIVE

## 2022-01-23 ENCOUNTER — Encounter: Payer: Self-pay | Admitting: Pharmacist

## 2022-01-24 LAB — RPR TITER: RPR Titer: 1:2 {titer} — ABNORMAL HIGH

## 2022-01-24 LAB — FLUORESCENT TREPONEMAL AB(FTA)-IGG-BLD: Fluorescent Treponemal ABS: REACTIVE — AB

## 2022-01-24 LAB — HIV ANTIBODY (ROUTINE TESTING W REFLEX): HIV 1&2 Ab, 4th Generation: NONREACTIVE

## 2022-01-24 LAB — RPR: RPR Ser Ql: REACTIVE — AB

## 2022-04-17 ENCOUNTER — Ambulatory Visit: Payer: Medicaid Other | Admitting: Family Medicine

## 2022-04-17 VITALS — BP 120/76 | HR 94 | Ht 60.0 in | Wt 167.0 lb

## 2022-04-17 DIAGNOSIS — G8929 Other chronic pain: Secondary | ICD-10-CM

## 2022-04-17 DIAGNOSIS — R519 Headache, unspecified: Secondary | ICD-10-CM | POA: Diagnosis not present
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
## 2022-04-17 NOTE — Patient Instructions (Signed)
Thank you for coming in today.   Consider transition glasses  Recheck as needed.

## 2022-04-27 ENCOUNTER — Other Ambulatory Visit (HOSPITAL_COMMUNITY)
Admission: RE | Admit: 2022-04-27 | Discharge: 2022-04-27 | Disposition: A | Discharge status: Home / Self Care | Payer: Medicaid Other | Source: Ambulatory Visit | Attending: Obstetrics and Gynecology | Admitting: Obstetrics and Gynecology

## 2022-04-27 ENCOUNTER — Ambulatory Visit (INDEPENDENT_AMBULATORY_CARE_PROVIDER_SITE_OTHER): Payer: Medicaid Other | Admitting: *Deleted

## 2022-04-27 VITALS — BP 110/74 | HR 87

## 2022-04-27 DIAGNOSIS — N898 Other specified noninflammatory disorders of vagina: Secondary | ICD-10-CM

## 2022-04-27 DIAGNOSIS — Z113 Encounter for screening for infections with a predominantly sexual mode of transmission: Secondary | ICD-10-CM

## 2022-04-27 DIAGNOSIS — R300 Dysuria: Secondary | ICD-10-CM | POA: Diagnosis not present

## 2022-04-27 LAB — POCT URINALYSIS DIPSTICK
Bilirubin, UA: NEGATIVE
Glucose, UA: NEGATIVE
Leukocytes, UA: NEGATIVE
Nitrite, UA: NEGATIVE
Odor: NORMAL
Protein, UA: POSITIVE — AB
Spec Grav, UA: <=1.005 — AB (ref 1.010–1.025)
Urobilinogen, UA: 0.2 E.U./dL
pH, UA: 8.5 — AB (ref 5.0–8.0)

## 2022-04-27 NOTE — Progress Notes (Signed)
SUBJECTIVE:  26 y.o. female who desires a STI screen. Denies abnormal vaginal discharge, bleeding or significant pelvic pain. No UTI symptoms. Denies history of known exposure to STD. Reactive RPR 08/24/21 and 01/19/22. Has been seen by [facility] for Infectious Disease. Request RPR today. Wants to know titer.  LMP: 04/21/22  OBJECTIVE:  She appears well.   ASSESSMENT:  STI Screen   PLAN:  Pt offered STI blood screening-requested GC, chlamydia, and trichomonas probe sent to lab.  Treatment: To be determined once lab results are received.  Pt follow up as needed.   SUBJECTIVE: Bailey Rose is a 26 y.o. female who complains of dysuria x 3 days, without flank pain, fever, chills, or abnormal vaginal discharge or bleeding.   OBJECTIVE: Appears well, in no apparent distress.  Vital signs are normal. Urine dipstick shows positive for RBC's, positive for protein, and positive for ketones.    ASSESSMENT: Dysuria  PLAN: Treatment per orders.  Call or return to clinic prn if these symptoms worsen or fail to improve as anticipated. Urine culture ordered.

## 2022-04-28 ENCOUNTER — Encounter: Payer: Self-pay | Admitting: Obstetrics

## 2022-04-28 LAB — CERVICOVAGINAL ANCILLARY ONLY
Bacterial Vaginitis (gardnerella): POSITIVE — AB
Candida Glabrata: NEGATIVE
Candida Vaginitis: NEGATIVE
Chlamydia: NEGATIVE
Comment: NEGATIVE
Comment: NEGATIVE
Comment: NEGATIVE
Comment: NEGATIVE
Comment: NEGATIVE
Comment: NORMAL
Neisseria Gonorrhea: NEGATIVE
Trichomonas: NEGATIVE

## 2022-04-29 LAB — URINE CULTURE

## 2022-04-29 LAB — HIV ANTIBODY (ROUTINE TESTING W REFLEX): HIV Screen 4th Generation wRfx: NONREACTIVE

## 2022-04-29 LAB — HEPATITIS B SURFACE ANTIGEN: Hepatitis B Surface Ag: NEGATIVE

## 2022-04-29 LAB — RPR, QUANT+TP ABS (REFLEX)
Rapid Plasma Reagin, Quant: 1:1 {titer} — ABNORMAL HIGH
T Pallidum Abs: REACTIVE — AB

## 2022-04-29 LAB — HEPATITIS C ANTIBODY: Hep C Virus Ab: NONREACTIVE

## 2022-04-29 LAB — RPR: RPR Ser Ql: REACTIVE — AB

## 2022-05-01 MED ORDER — METRONIDAZOLE 500 MG PO TABS: 500.0000 mg | ORAL_TABLET | Freq: Two times a day (BID) | ORAL | 0 refills | Status: DC

## 2022-05-01 NOTE — Addendum Note (Signed)
Addended by: Mora Bellman on: 05/01/2022 08:45 AM   Modules accepted: Orders

## 2022-06-22 ENCOUNTER — Ambulatory Visit: Payer: Medicaid Other | Admitting: Student

## 2022-06-27 ENCOUNTER — Ambulatory Visit: Payer: Medicaid Other

## 2022-06-29 ENCOUNTER — Ambulatory Visit: Payer: Medicaid Other | Admitting: Obstetrics

## 2022-07-13 ENCOUNTER — Other Ambulatory Visit: Payer: Self-pay

## 2022-07-13 ENCOUNTER — Encounter (HOSPITAL_COMMUNITY): Payer: Self-pay

## 2022-07-13 ENCOUNTER — Emergency Department (HOSPITAL_COMMUNITY): Payer: Medicaid Other

## 2022-07-13 ENCOUNTER — Emergency Department (HOSPITAL_COMMUNITY)
Admission: EM | Admit: 2022-07-13 | Discharge: 2022-07-13 | Disposition: A | Discharge status: Home / Self Care | Payer: Medicaid Other | Attending: Emergency Medicine | Admitting: Emergency Medicine

## 2022-07-13 DIAGNOSIS — J45909 Unspecified asthma, uncomplicated: Secondary | ICD-10-CM | POA: Insufficient documentation

## 2022-07-13 DIAGNOSIS — F17210 Nicotine dependence, cigarettes, uncomplicated: Secondary | ICD-10-CM | POA: Insufficient documentation

## 2022-07-13 DIAGNOSIS — R569 Unspecified convulsions: Secondary | ICD-10-CM | POA: Insufficient documentation

## 2022-07-13 DIAGNOSIS — D72829 Elevated white blood cell count, unspecified: Secondary | ICD-10-CM | POA: Insufficient documentation

## 2022-07-13 DIAGNOSIS — R000 Tachycardia, unspecified: Secondary | ICD-10-CM | POA: Diagnosis not present

## 2022-07-13 DIAGNOSIS — Z91040 Latex allergy status: Secondary | ICD-10-CM | POA: Insufficient documentation

## 2022-07-13 DIAGNOSIS — R410 Disorientation, unspecified: Secondary | ICD-10-CM | POA: Diagnosis not present

## 2022-07-13 DIAGNOSIS — R0902 Hypoxemia: Secondary | ICD-10-CM | POA: Diagnosis not present

## 2022-07-13 DIAGNOSIS — I959 Hypotension, unspecified: Secondary | ICD-10-CM | POA: Diagnosis not present

## 2022-07-13 LAB — COMPREHENSIVE METABOLIC PANEL
ALT: 9 U/L (ref 0–44)
AST: 14 U/L — ABNORMAL LOW (ref 15–41)
Albumin: 3.9 g/dL (ref 3.5–5.0)
Alkaline Phosphatase: 53 U/L (ref 38–126)
Anion gap: 9 (ref 5–15)
BUN: 9 mg/dL (ref 6–20)
CO2: 27 mmol/L (ref 22–32)
Calcium: 9.3 mg/dL (ref 8.9–10.3)
Chloride: 104 mmol/L (ref 98–111)
Creatinine, Ser: 0.96 mg/dL (ref 0.44–1.00)
GFR, Estimated: >60 mL/min (ref >60)
Glucose, Bld: 90 mg/dL (ref 70–99)
Potassium: 3.9 mmol/L (ref 3.5–5.1)
Sodium: 140 mmol/L (ref 135–145)
Total Bilirubin: 0.5 mg/dL (ref 0.3–1.2)
Total Protein: 6.9 g/dL (ref 6.5–8.1)

## 2022-07-13 LAB — RAPID URINE DRUG SCREEN, HOSP PERFORMED
Amphetamines: NOT DETECTED
Barbiturates: NOT DETECTED
Benzodiazepines: NOT DETECTED
Cocaine: NOT DETECTED
Opiates: NOT DETECTED
Tetrahydrocannabinol: NOT DETECTED

## 2022-07-13 LAB — CBC WITH DIFFERENTIAL/PLATELET
Abs Immature Granulocytes: 0.1 10*3/uL — ABNORMAL HIGH (ref 0.00–0.07)
Basophils Absolute: 0.1 10*3/uL (ref 0.0–0.1)
Basophils Relative: 1 %
Eosinophils Absolute: 0.1 10*3/uL (ref 0.0–0.5)
Eosinophils Relative: 1 %
HCT: 39.8 % (ref 36.0–46.0)
Hemoglobin: 13.6 g/dL (ref 12.0–15.0)
Immature Granulocytes: 1 %
Lymphocytes Relative: 17 %
Lymphs Abs: 2.3 10*3/uL (ref 0.7–4.0)
MCH: 25.6 pg — ABNORMAL LOW (ref 26.0–34.0)
MCHC: 34.2 g/dL (ref 30.0–36.0)
MCV: 75 fL — ABNORMAL LOW (ref 80.0–100.0)
Monocytes Absolute: 0.8 10*3/uL (ref 0.1–1.0)
Monocytes Relative: 6 %
Neutro Abs: 9.7 10*3/uL — ABNORMAL HIGH (ref 1.7–7.7)
Neutrophils Relative %: 74 %
Platelets: 229 10*3/uL (ref 150–400)
RBC: 5.31 MIL/uL — ABNORMAL HIGH (ref 3.87–5.11)
RDW: 13.2 % (ref 11.5–15.5)
WBC: 13.1 10*3/uL — ABNORMAL HIGH (ref 4.0–10.5)
nRBC: 0 % (ref 0.0–0.2)

## 2022-07-13 LAB — I-STAT BETA HCG BLOOD, ED (MC, WL, AP ONLY): I-stat hCG, quantitative: <5 m[IU]/mL (ref <5)

## 2022-07-13 LAB — MAGNESIUM: Magnesium: 2.1 mg/dL (ref 1.7–2.4)

## 2022-07-13 MED ORDER — METOCLOPRAMIDE HCL 5 MG/ML IJ SOLN
10.0000 mg | Freq: Once | INTRAMUSCULAR | Status: AC
  Administered 2022-07-13: 10 mg via INTRAVENOUS
  Filled 2022-07-13: qty 2

## 2022-07-13 MED ORDER — KETOROLAC TROMETHAMINE 15 MG/ML IJ SOLN
15.0000 mg | Freq: Once | INTRAMUSCULAR | Status: AC
  Administered 2022-07-13: 15 mg via INTRAVENOUS
  Filled 2022-07-13: qty 1

## 2022-07-13 NOTE — ED Provider Notes (Signed)
[facility] EMERGENCY DEPARTMENT Provider Note   CSN: [id number] Arrival date & time: 07/13/22  1644     History  Chief Complaint  Patient presents with   Seizures    Bailey Rose is a 26 y.o. female.  Patient is a 26 year old female with a history of PCOS, migraines and asthma who is presenting today with EMS for first-time seizure.  Patient's mom gives the history as the patient cannot remember what happened.  Patient's mom reports that they were fine all day and then the patient lay down on the couch to go to sleep and her mom woke up to her jerking.  She then fell on the floor and she was jerking all over and bit her tongue.  This lasted for less than 5 minutes.  When paramedics arrived patient was postictal and sleepy.  Patient denies any drug use, no regular alcohol use.  She does smoke cigarettes.  She has been taking boric acid for weight loss but denies any other over-the-counter medications.  No history of seizures in herself or her family in the past.  She reports having a headache now but otherwise has no other complaints.  The history is provided by the patient and a parent.  Seizures      Home Medications Prior to Admission medications   Medication Sig Start Date End Date Taking? Authorizing Provider  magnesium oxide (MAG-OX) 400 MG tablet Take 400 mg by mouth daily. Patient not taking: Reported on 04/27/2022    [provider]  metroNIDAZOLE (FLAGYL) 500 MG tablet Take 1 tablet (500 mg total) by mouth 2 (two) times daily. 05/01/22   Constant, Peggy, MD  NON FORMULARY CamuCamu- OTC    [provider]  Riboflavin 400 MG CAPS Take by mouth. Patient not taking: Reported on 04/27/2022    [provider]  Turmeric (QC TUMERIC COMPLEX) 500 MG CAPS Take by mouth.    [provider]      Allergies    Latex    Review of Systems   Review of Systems  Neurological:  Positive for seizures.    Physical  Exam Updated Vital Signs Ht 4\' 11"  (1.499 m)   Wt 77.1 kg   LMP  (LMP Unknown)   SpO2 98%   BMI 34.34 kg/m  Physical Exam Vitals and nursing note reviewed.  Constitutional:      General: She is not in acute distress.    Appearance: She is well-developed.  HENT:     Head: Normocephalic and atraumatic.     Comments: Bit the right side of her tongue Eyes:     Pupils: Pupils are equal, round, and reactive to light.  Cardiovascular:     Rate and Rhythm: Normal rate and regular rhythm.     Heart sounds: Normal heart sounds. No murmur heard.    No friction rub.  Pulmonary:     Effort: Pulmonary effort is normal.     Breath sounds: Normal breath sounds. No wheezing or rales.  Abdominal:     General: Bowel sounds are normal. There is no distension.     Palpations: Abdomen is soft.     Tenderness: There is no abdominal tenderness. There is no guarding or rebound.  Musculoskeletal:        General: No tenderness. Normal range of motion.     Comments: No edema  Skin:    General: Skin is warm and dry.     Findings: No rash.  Neurological:  Mental Status: She is alert and oriented to person, place, and time. Mental status is at baseline.     Cranial Nerves: No cranial nerve deficit.     Sensory: No sensory deficit.     Coordination: Coordination normal.     Gait: Gait normal.  Psychiatric:        Behavior: Behavior normal.     ED Results / Procedures / Treatments   Labs (all labs ordered are listed, but only abnormal results are displayed) Labs Reviewed  CBC WITH DIFFERENTIAL/PLATELET - Abnormal; Notable for the following components:      Result Value   WBC 13.1 (*)    RBC 5.31 (*)    MCV 75.0 (*)    MCH 25.6 (*)    Neutro Abs 9.7 (*)    Abs Immature Granulocytes 0.10 (*)    All other components within normal limits  COMPREHENSIVE METABOLIC PANEL - Abnormal; Notable for the following components:   AST 14 (*)    All other components within normal limits  RAPID URINE  DRUG SCREEN, HOSP PERFORMED  MAGNESIUM  I-STAT BETA HCG BLOOD, ED ([facility], [facility], [facility] ONLY)    EKG EKG Interpretation  Date/Time:  Thursday July 13 2022 17:04:10 EST Ventricular Rate:  105 PR Interval:  153 QRS Duration: 74 QT Interval:  318 QTC Calculation: 421 R Axis:   81 Text Interpretation: Sinus tachycardia No previous tracing Confirmed by Gwyneth Sprout ([id number]) on 07/13/2022 7:13:25 PM  Radiology CT Head Wo Contrast  Result Date: 07/13/2022 CLINICAL DATA:  Seizure, new-onset, no history of trauma EXAM: CT HEAD WITHOUT CONTRAST TECHNIQUE: Contiguous axial images were obtained from the base of the skull through the vertex without intravenous contrast. RADIATION DOSE REDUCTION: This exam was performed according to the departmental dose-optimization program which includes automated exposure control, adjustment of the mA and/or kV according to patient size and/or use of iterative reconstruction technique. COMPARISON:  08/31/2020 FINDINGS: Brain: No acute intracranial abnormality. Specifically, no hemorrhage, hydrocephalus, mass lesion, acute infarction, or significant intracranial injury. Vascular: No hyperdense vessel or unexpected calcification. Skull: No acute calvarial abnormality. Sinuses/Orbits: No acute findings Other: None IMPRESSION: Normal study. Electronically Signed   By: Charlett Nose M.D.   On: 07/13/2022 17:40    Procedures Procedures    Medications Ordered in ED Medications  metoCLOPramide (REGLAN) injection 10 mg (has no administration in time range)  ketorolac (TORADOL) 15 MG/ML injection 15 mg (has no administration in time range)    ED Course/ Medical Decision Making/ A&P                           Medical Decision Making Amount and/or Complexity of Data Reviewed Independent Historian: parent Labs: ordered. Decision-making details documented in ED Course. Radiology: ordered and independent interpretation performed. Decision-making details documented in ED  Course. ECG/medicine tests: ordered and independent interpretation performed. Decision-making details documented in ED Course.  Risk Prescription drug management.   Pt presenting today with a complaint that caries a high risk for morbidity and mortality.  Here today with new onset seizure.  Unclear precipitating factor other than patient said she has been under a lot of stress lately but denies significant sleep deprivation or drug use or medications that would be culprit.  Patient other than minimal bite marks to the right side of her tongue has no other obvious abnormalities.  Patient has no infectious symptoms.  She denies any chest pain or abdominal pain.  She is  currently back to baseline with no neurologic abnormalities.  I independently interpreted patient's EKG and labs.  EKG was sinus tachycardia but no other acute findings.  hCG is negative, CBC with mild leukocytosis of 13 but otherwise normal, CMP within normal limits, UDS and alcohol levels are negative.  Patient does not drink alcohol regularly to suggest that this is alcohol withdrawal. I have independently visualized and interpreted pt's images today.  Head CT is negative for acute pathology.  Findings discussed with the patient and her family members.  At this time patient is stable for discharge home.  Discussed with her not driving, bathing in a bathtub and using a helmet when riding a bicycle.  Also given instructions to parents if she has another seizure.  She will need follow-up with neurology which an ambulatory referral was placed.          Final Clinical Impression(s) / ED Diagnoses Final diagnoses:  Seizure (HCC)    Rx / DC Orders ED Discharge Orders          Ordered    Ambulatory referral to Neurology       Comments: An appointment is requested in approximately: 1 week New onset seizure.  No provoking causes   07/13/22 Arlis Porta, MD 07/13/22 1929

## 2022-07-13 NOTE — Progress Notes (Signed)
   07/13/22 1720  Clinical Encounter Type  Visited With Family;Patient not available  Visit Type Initial;Spiritual support  Referral From Chaplain (Chaplain Jon Gills)  Consult/Referral To Chaplain  Spiritual Encounters  Spiritual Needs Prayer (Per family's request)  Stress Factors  Family Stress Factors Other (Comment) (Stress and Uncertainty)   This note was prepared by Deneen Harts, M.Div..  For questions please contact by phone [phone].

## 2022-07-13 NOTE — ED Triage Notes (Signed)
Patient arrived to the ED via EMS due to a seizure. Patient states that she has never had a seizure before. Patient states this was witness by her mother. Above right eye appears to be swollen, patient does not recall how it happened. Patient is A&Ox4.

## 2022-07-13 NOTE — Discharge Instructions (Addendum)
Avoid driving, bathing in a bathtub or swimming by yourself.  Make sure you wear a helmet when riding a bicycle.  If you have another seizure return to the emergency room.  Make sure you are staying hydrated and getting plenty of rest.

## 2022-07-14 ENCOUNTER — Encounter (HOSPITAL_COMMUNITY): Payer: Self-pay | Admitting: Internal Medicine

## 2022-07-14 ENCOUNTER — Emergency Department (HOSPITAL_COMMUNITY): Payer: Medicaid Other

## 2022-07-14 ENCOUNTER — Observation Stay (HOSPITAL_COMMUNITY): Payer: Medicaid Other

## 2022-07-14 ENCOUNTER — Observation Stay (HOSPITAL_COMMUNITY)
Admission: EM | Admit: 2022-07-14 | Discharge: 2022-07-14 | Disposition: A | Discharge status: Home / Self Care | Payer: Medicaid Other | Attending: Emergency Medicine | Admitting: Emergency Medicine

## 2022-07-14 DIAGNOSIS — Z8619 Personal history of other infectious and parasitic diseases: Secondary | ICD-10-CM | POA: Diagnosis present

## 2022-07-14 DIAGNOSIS — F17210 Nicotine dependence, cigarettes, uncomplicated: Secondary | ICD-10-CM | POA: Diagnosis not present

## 2022-07-14 DIAGNOSIS — J45909 Unspecified asthma, uncomplicated: Secondary | ICD-10-CM | POA: Insufficient documentation

## 2022-07-14 DIAGNOSIS — Z79899 Other long term (current) drug therapy: Secondary | ICD-10-CM | POA: Insufficient documentation

## 2022-07-14 DIAGNOSIS — R569 Unspecified convulsions: Secondary | ICD-10-CM | POA: Diagnosis not present

## 2022-07-14 DIAGNOSIS — Z91040 Latex allergy status: Secondary | ICD-10-CM | POA: Diagnosis not present

## 2022-07-14 LAB — BASIC METABOLIC PANEL
Anion gap: 8 (ref 5–15)
BUN: 13 mg/dL (ref 6–20)
CO2: 23 mmol/L (ref 22–32)
Calcium: 8.9 mg/dL (ref 8.9–10.3)
Chloride: 107 mmol/L (ref 98–111)
Creatinine, Ser: 0.71 mg/dL (ref 0.44–1.00)
GFR, Estimated: >60 mL/min (ref >60)
Glucose, Bld: 90 mg/dL (ref 70–99)
Potassium: 3.5 mmol/L (ref 3.5–5.1)
Sodium: 138 mmol/L (ref 135–145)

## 2022-07-14 LAB — CBC
HCT: 36.7 % (ref 36.0–46.0)
Hemoglobin: 12.6 g/dL (ref 12.0–15.0)
MCH: 25.4 pg — ABNORMAL LOW (ref 26.0–34.0)
MCHC: 34.3 g/dL (ref 30.0–36.0)
MCV: 73.8 fL — ABNORMAL LOW (ref 80.0–100.0)
Platelets: 208 10*3/uL (ref 150–400)
RBC: 4.97 MIL/uL (ref 3.87–5.11)
RDW: 13.3 % (ref 11.5–15.5)
WBC: 11.3 10*3/uL — ABNORMAL HIGH (ref 4.0–10.5)
nRBC: 0 % (ref 0.0–0.2)

## 2022-07-14 MED ORDER — KETOROLAC TROMETHAMINE 30 MG/ML IJ SOLN
15.0000 mg | Freq: Once | INTRAMUSCULAR | Status: AC
  Administered 2022-07-14: 15 mg via INTRAVENOUS
  Filled 2022-07-14: qty 1

## 2022-07-14 MED ORDER — LORAZEPAM 2 MG/ML IJ SOLN
1.0000 mg | Freq: Once | INTRAMUSCULAR | Status: AC
  Administered 2022-07-14: 1 mg via INTRAVENOUS
  Filled 2022-07-14: qty 1

## 2022-07-14 MED ORDER — ACETAMINOPHEN 325 MG PO TABS
650.0000 mg | ORAL_TABLET | Freq: Four times a day (QID) | ORAL | Status: DC | PRN
  Administered 2022-07-14: 650 mg via ORAL
  Filled 2022-07-14: qty 2

## 2022-07-14 MED ORDER — ENOXAPARIN SODIUM 40 MG/0.4ML IJ SOSY: 40.0000 mg | PREFILLED_SYRINGE | Freq: Every day | INTRAMUSCULAR | Status: DC

## 2022-07-14 MED ORDER — ACETAMINOPHEN 325 MG PO TABS: 650.0000 mg | ORAL_TABLET | Freq: Four times a day (QID) | ORAL | Status: DC | PRN

## 2022-07-14 MED ORDER — ONDANSETRON HCL 4 MG/2ML IJ SOLN: 4.0000 mg | Freq: Four times a day (QID) | INTRAMUSCULAR | Status: DC | PRN

## 2022-07-14 MED ORDER — ACETAMINOPHEN 650 MG RE SUPP: 650.0000 mg | Freq: Four times a day (QID) | RECTAL | Status: DC | PRN

## 2022-07-14 MED ORDER — GADOBUTROL 1 MMOL/ML IV SOLN
7.0000 mL | Freq: Once | INTRAVENOUS | Status: AC | PRN
  Administered 2022-07-14: 7 mL via INTRAVENOUS

## 2022-07-14 MED ORDER — ONDANSETRON HCL 4 MG PO TABS: 4.0000 mg | ORAL_TABLET | Freq: Four times a day (QID) | ORAL | Status: DC | PRN

## 2022-07-14 NOTE — TOC Transition Note (Signed)
Transition of Care [facility]) - CM/SW Discharge Note   Patient Details  Name: Bailey Rose MRN: [id number] Date of Birth: [birth year]  Transition of Care [facility]) CM/SW Contact:  Kermit Balo, RN Phone Number: 07/14/2022, 2:23 PM   Clinical Narrative:    Pt is discharging home with self care. Pt without a PCP. Pt agreeable to CM assisting in finding a PCP. CM unable to get appt today but information for pt to call and schedule an appt is on the AVS.  Pt lives with her mother and she can provide supervision at home.  Mother transporting home today.   Final next level of care: Home/Self Care Barriers to Discharge: No Barriers Identified   Patient Goals and CMS Choice      Discharge Placement                         Discharge Plan and Services Additional resources added to the After Visit Summary for                                       Social Determinants of Health (SDOH) Interventions SDOH Screenings   Food Insecurity: No Food Insecurity (07/14/2022)  Housing: Low Risk  (07/14/2022)  Transportation Needs: No Transportation Needs (07/14/2022)  Utilities: Not At Risk (07/14/2022)  Depression (PHQ2-9): Low Risk  (09/12/2021)  Tobacco Use: High Risk (07/14/2022)     Readmission Risk Interventions     No data to display

## 2022-07-14 NOTE — ED Provider Notes (Signed)
[facility] EMERGENCY DEPARTMENT Provider Note   CSN: [id number] Arrival date & time: 07/14/22  0007     History  Chief Complaint  Patient presents with   Seizures    Bailey Rose is a 26 y.o. female.  HPI 26 year old female who presents to the ER with a repeat seizure.  She was seen earlier in the ER several hours ago with an isolated new onset seizure.  She returns with her parents at bedside who stated that she had another tonic-clonic seizure.  She is answering questions appropriately back to baseline but is complaining of a headache.  Denies any fever or neck stiffness.    Home Medications Prior to Admission medications   Medication Sig Start Date End Date Taking? Authorizing Provider  magnesium oxide (MAG-OX) 400 MG tablet Take 400 mg by mouth daily. Patient not taking: Reported on 04/27/2022    [provider]  metroNIDAZOLE (FLAGYL) 500 MG tablet Take 1 tablet (500 mg total) by mouth 2 (two) times daily. 05/01/22   Constant, Peggy, MD  NON FORMULARY CamuCamu- OTC    [provider]  Riboflavin 400 MG CAPS Take by mouth. Patient not taking: Reported on 04/27/2022    [provider]  Turmeric (QC TUMERIC COMPLEX) 500 MG CAPS Take by mouth.    [provider]      Allergies    Latex    Review of Systems   Review of Systems Ten systems reviewed and are negative for acute change, except as noted in the HPI.   Physical Exam Updated Vital Signs BP 119/75   Pulse 91   Temp 98.3 F (36.8 C) (Oral)   Resp 13   LMP  (LMP Unknown)   SpO2 98%  Physical Exam Vitals and nursing note reviewed.  Constitutional:      General: She is not in acute distress.    Appearance: She is well-developed.  HENT:     Head: Normocephalic and atraumatic.     Mouth/Throat:     Comments: Evidence of right sided tongue biting  Eyes:     Conjunctiva/sclera: Conjunctivae normal.  Cardiovascular:     Rate and Rhythm: Normal rate  and regular rhythm.     Heart sounds: No murmur heard. Pulmonary:     Effort: Pulmonary effort is normal. No respiratory distress.     Breath sounds: Normal breath sounds.  Abdominal:     Palpations: Abdomen is soft.     Tenderness: There is no abdominal tenderness.  Musculoskeletal:        General: No swelling.     Cervical back: Neck supple.  Skin:    General: Skin is warm and dry.     Capillary Refill: Capillary refill takes less than 2 seconds.  Neurological:     Mental Status: She is alert.  Psychiatric:        Mood and Affect: Mood normal.     ED Results / Procedures / Treatments   Labs (all labs ordered are listed, but only abnormal results are displayed) Labs Reviewed  CBC  BASIC METABOLIC PANEL    EKG None  Radiology MR Brain W and Wo Contrast  Result Date: 07/14/2022 CLINICAL DATA:  First-time seizure.  History of migraines. EXAM: MRI HEAD WITHOUT AND WITH CONTRAST TECHNIQUE: Multiplanar, multiecho pulse sequences of the brain and surrounding structures were obtained without and with intravenous contrast. CONTRAST:  38mL GADAVIST GADOBUTROL 1 MMOL/ML IV SOLN COMPARISON:  03/01/2021 FINDINGS: Brain: No acute infarction, hemorrhage,  hydrocephalus, extra-axial collection or mass lesion. No chronic microhemorrhage. Normal white matter signal and CSF spaces. Normal midline structures. No abnormal contrast enhancement. Vascular: Normal flow voids. Skull and upper cervical spine: Normal marrow signal. Sinuses/Orbits: Negative. Other: None IMPRESSION: Normal MRI of the brain. Electronically Signed   By: Deatra Robinson M.D.   On: 07/14/2022 02:21   CT Head Wo Contrast  Result Date: 07/13/2022 CLINICAL DATA:  Seizure, new-onset, no history of trauma EXAM: CT HEAD WITHOUT CONTRAST TECHNIQUE: Contiguous axial images were obtained from the base of the skull through the vertex without intravenous contrast. RADIATION DOSE REDUCTION: This exam was performed according to the  departmental dose-optimization program which includes automated exposure control, adjustment of the mA and/or kV according to patient size and/or use of iterative reconstruction technique. COMPARISON:  08/31/2020 FINDINGS: Brain: No acute intracranial abnormality. Specifically, no hemorrhage, hydrocephalus, mass lesion, acute infarction, or significant intracranial injury. Vascular: No hyperdense vessel or unexpected calcification. Skull: No acute calvarial abnormality. Sinuses/Orbits: No acute findings Other: None IMPRESSION: Normal study. Electronically Signed   By: Charlett Nose M.D.   On: 07/13/2022 17:40    Procedures Procedures    Medications Ordered in ED Medications  enoxaparin (LOVENOX) injection 40 mg (has no administration in time range)  acetaminophen (TYLENOL) tablet 650 mg (has no administration in time range)    Or  acetaminophen (TYLENOL) suppository 650 mg (has no administration in time range)  ondansetron (ZOFRAN) tablet 4 mg (has no administration in time range)    Or  ondansetron (ZOFRAN) injection 4 mg (has no administration in time range)  LORazepam (ATIVAN) injection 1 mg (1 mg Intravenous Given 07/14/22 0116)  ketorolac (TORADOL) 30 MG/ML injection 15 mg (15 mg Intravenous Given 07/14/22 0116)  gadobutrol (GADAVIST) 1 MMOL/ML injection 7 mL (7 mLs Intravenous Contrast Given 07/14/22 0152)    ED Course/ Medical Decision Making/ A&P                           Medical Decision Making Amount and/or Complexity of Data Reviewed Radiology: ordered.  Risk Prescription drug management. Decision regarding hospitalization.   26 year old female presented to the ER with another seizure.  She is back to baseline now.  Complaining of a mild headache.  No fevers, no nuchal rigidity. Low suspicion for meningitis. Discussed the case with Dr. Amada Jupiter, who recommends admission, EEG, MRI of the brain and 1 mg of Ativan.  Will give additional Toradol for headache.  Consulted  hospitalist team for admission. Final Clinical Impression(s) / ED Diagnoses Final diagnoses:  Seizure [facility])    Rx / DC Orders ED Discharge Orders     None         Leone Brand 07/14/22 8527    Maia Plan, MD 07/14/22 0700

## 2022-07-14 NOTE — Progress Notes (Signed)
07/14/2022 0445 Received pt to room 3W-08 from ED with dx of Seizures.  Pt is A&Ox4, no C/O voiced.  Tele monitor applied and CCMD notified.  Oriented to room, call light and bed.  Call bell in reach, family at bedside. Kathryne Hitch

## 2022-07-14 NOTE — Progress Notes (Signed)
Discharge teaching provided. Meds, diet, activity, follow up appointments and seizure precautions reviewed and all questions answered. Copy of instructions given to patient.

## 2022-07-14 NOTE — Assessment & Plan Note (Signed)
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 Previous negative RPR titer was 02/24/21. Treated with ceftriaxone and received 2.4 million units of Bicillin once. RPR titer has trended down over the course of the year, most recently down to 1:1 as of Oct, suggesting successful treatment. 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

## 2022-07-14 NOTE — Consult Note (Signed)
Neurology Consultation Reason for Consult: Seizure Referring Physician: Long, J  CC: Seizure  History is obtained from: Patient  HPI: Bailey Rose is a 26 y.o. female with a history of migraines who presents with new onset seizures.  She has had two over the course of the evening, was evaluated by Dr. Anitra Lauth earlier.  She had been asleep and her mom then noticed that she was jerking, with arms outstretched and bit her tongue.  This lasted for approximately 5 minutes.  She was evaluated in the ER and given return to baseline with no concerning findings, she was discharged for outpatient follow-up.  Unfortunately, she subsequently was seen to have a second seizure at home.  Since the second seizure, she does have a mild headache consistent with her typical migraines.  She endorses occasional alcohol, but not a heavy or daily drinker.  She does have a history of syphilis, but has repeatedly been HIV negative and is status post treatment.  Past Medical History:  Diagnosis Date   Asthma    Migraines    Polycystic ovarian syndrome    Sickle cell trait (HCC)      Family History  Problem Relation Age of Onset   Sickle cell anemia Mother    Cancer Maternal Grandmother        lung   Migraines Maternal Grandmother    Lupus Maternal Grandmother      Social History:  reports that she has been smoking cigarettes and e-cigarettes. She has been smoking an average of .1 packs per day. She has never used smokeless tobacco. She reports current alcohol use. She reports that she does not currently use drugs after having used the following drugs: Marijuana.   Exam: Current vital signs: BP 114/78   Pulse 98   Temp 98.3 F (36.8 C) (Oral)   Resp 13   LMP  (LMP Unknown)   SpO2 98%  Vital signs in last 24 hours: Temp:  [98.3 F (36.8 C)-98.4 F (36.9 C)] 98.3 F (36.8 C) (12/29 0013) Pulse Rate:  [80-101] 98 (12/29 0030) Resp:  [13] 13 (12/29 0013) BP: (114-118)/(69-81) 114/78 (12/29  0030) SpO2:  [96 %-98 %] 98 % (12/29 0030) Weight:  [77.1 kg] 77.1 kg (12/28 1712)   Physical Exam  Appears well-developed and well-nourished.   Neuro: Mental Status: Patient is awake, alert, oriented to person, place, month, year, and situation. Patient is able to give a clear and coherent history. No signs of aphasia or neglect Cranial Nerves: II: Visual Fields are full. Pupils are equal, round, and reactive to light.   III,IV, VI: EOMI without ptosis or diploplia.  V: Facial sensation is symmetric to temperature VII: Facial movement is symmetric.  VIII: hearing is intact to voice X: Uvula elevates symmetrically XI: Shoulder shrug is symmetric. XII: tongue is midline without atrophy or fasciculations.  Motor: Tone is normal. Bulk is normal. 5/5 strength was present in all four extremities.  Sensory: Sensation is symmetric to light touch and temperature in the arms and legs. Deep Tendon Reflexes: 2+ and symmetric in the biceps and patellae.  Plantars: Toes are downgoing bilaterally.  Cerebellar: FNF and HKS are intact bilaterally    I have reviewed labs in epic and the results pertinent to this consultation are: CMP-unremarkable UDS-negative Magnesium 2.1  I have reviewed the images obtained: CT head-negative  Impression: 26 year old with new onset seizures.  For the decision making regarding long-term antiepileptic therapy, multiple seizures within 24 hours can be considered a single event.  Certainly with her only being 26, the decision to commit to lifelong therapy is significant.  Given that she will be under a period of driving prohibition at this time, I think it would be reasonable to hold off on antiepileptic therapy to see if she truly does need this.  To help risk stratify her, an MRI and an EEG could be obtained if either suggests increased risk of seizure recurrence, then I would favor starting antiepileptic therapy at that time.  Recommendations: 1) MRI  brain with and without contrast 2) EEG 3) I have discussed seizure precautions with her including the fact that she is not allowed to drive by law for a period of 6 months.  She expressed understanding.    Ritta Slot, MD [facility] [phone]  If 7pm- 7am, please page neurology on call as listed in AMION.

## 2022-07-14 NOTE — Procedures (Signed)
Patient Name: Bailey Rose  MRN: [id number]  Epilepsy Attending: Charlsie Quest  Referring Physician/Provider: Leone Brand  Date: 07/14/2022 Duration: 26.26 mins  Patient history: 26 y.o. female with a history of migraines who presents with new onset seizures. EEG to evaluate for seizure.  Level of alertness: Awake, asleep  AEDs during EEG study: Ativan  Technical aspects: This EEG study was done with scalp electrodes positioned according to the 10-20 International system of electrode placement. Electrical activity was reviewed with band pass filter of 1-70Hz , sensitivity of 7 uV/mm, display speed of 4mm/sec with a 60Hz  notched filter applied as appropriate. EEG data were recorded continuously and digitally stored.  Video monitoring was available and reviewed as appropriate.  Description: The posterior dominant rhythm consists of 9-10 Hz activity of moderate voltage (25-35 uV) seen predominantly in posterior head regions, symmetric and reactive to eye opening and eye closing. Sleep was characterized by vertex waves, sleep spindles (12 to 14 Hz), maximal frontocentral region. Physiologic photic driving was not seen during photic stimulation. Hyperventilation was not performed.     IMPRESSION: This study is within normal limits. No seizures or epileptiform discharges were seen throughout the recording.  A normal interictal EEG does not exclude  the diagnosis of epilepsy.  [name] 

## 2022-07-14 NOTE — Progress Notes (Signed)
EEG complete - results pending 

## 2022-07-14 NOTE — Progress Notes (Signed)
   07/14/22 1200  Clinical Encounter Type  Visited With Patient and family together  Visit Type Spiritual support;Follow-up  Referral From Chaplain  Consult/Referral To Chaplain  Spiritual Encounters  Spiritual Needs Prayer;Emotional   Chaplain visited with patient who experienced seizures on 07/13/2022. Patient was admitted after being discharged from ED initially but had to brought back to hospital. Chaplain provided reflective listening as patient expressed concerns about health issue. Patient is being supported by her mother Carney Bern) and looks forward to discharge.   Jon Gills, Resident Chaplain [phone]

## 2022-07-14 NOTE — H&P (Signed)
History and Physical    Patient: Bailey Rose LNL:[id number] DOB: 08-24-1995 DOA: 07/14/2022 DOS: the patient was seen and examined on 07/14/2022 PCP: Patient, No Pcp Per  Patient coming from: Home  Chief Complaint:  Chief Complaint  Patient presents with   Seizures   HPI: Bailey Rose is a 26 y.o. female with medical history significant of sickle cell trait, PCOS.  Pt with h/o treated syphilis, titer decreasing this past year following treatment.  HIV neg as of Oct 2023.  Pt had influenza-like illness last week (URI symptoms and whole family sick with similar symptoms), this has resolved.  Today presents to ED with new onset seizures.  2 seizures over course of evening, she had been asleep and her mom then noticed that she was jerking, with arms outstretched and bit her tongue.  This lasted for approximately 5 minutes.   She was evaluated in the ER and given return to baseline with no concerning findings, she was discharged for outpatient follow-up.  Unfortunately, she subsequently was seen to have a second seizure at home.  Since the second seizure, she does have a mild headache consistent with her typical migraines.   She endorses occasional alcohol, but not a heavy or daily drinker.     Review of Systems: As mentioned in the history of present illness. All other systems reviewed and are negative. Past Medical History:  Diagnosis Date   Asthma    Migraines    Polycystic ovarian syndrome    Sickle cell trait (HCC)    Past Surgical History:  Procedure Laterality Date   TONSILLECTOMY     WISDOM TOOTH EXTRACTION  2017   Social History:  reports that she has been smoking cigarettes and e-cigarettes. She has been smoking an average of .1 packs per day. She has never used smokeless tobacco. She reports current alcohol use. She reports that she does not currently use drugs after having used the following drugs: Marijuana.  Allergies  Allergen Reactions   Latex Itching     Reports itching and irritation with condom use.    Family History  Problem Relation Age of Onset   Sickle cell anemia Mother    Cancer Maternal Grandmother        lung   Migraines Maternal Grandmother    Lupus Maternal Grandmother     Prior to Admission medications   Medication Sig Start Date End Date Taking? Authorizing Provider  magnesium oxide (MAG-OX) 400 MG tablet Take 400 mg by mouth daily. Patient not taking: Reported on 04/27/2022    [provider]  metroNIDAZOLE (FLAGYL) 500 MG tablet Take 1 tablet (500 mg total) by mouth 2 (two) times daily. 05/01/22   Constant, Peggy, MD  NON FORMULARY CamuCamu- OTC    [provider]  Riboflavin 400 MG CAPS Take by mouth. Patient not taking: Reported on 04/27/2022    [provider]  Turmeric (QC TUMERIC COMPLEX) 500 MG CAPS Take by mouth.    [provider]    Physical Exam: Vitals:   07/14/22 0013 07/14/22 0015 07/14/22 0030 07/14/22 0045  BP:  115/72 114/78 119/75  Pulse:  94 98 91  Resp:      Temp: 98.3 F (36.8 C)     TempSrc: Oral     SpO2:  97% 98% 98%   Constitutional: NAD, calm, comfortable Eyes: PERRL, lids and conjunctivae normal ENMT: Mucous membranes are moist. Posterior pharynx clear of any exudate or lesions.Normal dentition.  Neck: normal, supple, no masses, no  thyromegaly Respiratory: clear to auscultation bilaterally, no wheezing, no crackles. Normal respiratory effort. No accessory muscle use.  Cardiovascular: Regular rate and rhythm, no murmurs / rubs / gallops. No extremity edema. 2+ pedal pulses. No carotid bruits.  Abdomen: no tenderness, no masses palpated. No hepatosplenomegaly. Bowel sounds positive.  Musculoskeletal: no clubbing / cyanosis. No joint deformity upper and lower extremities. Good ROM, no contractures. Normal muscle tone.  Skin: no rashes, lesions, ulcers. No induration Neurologic: CN 2-12 grossly intact. Sensation intact, DTR normal. Strength 5/5 in  all 4.  Psychiatric: Normal judgment and insight. Alert and oriented x 3. Normal mood.   Data Reviewed:       Latest Ref Rng & Units 07/13/2022    5:25 PM 12/19/2019   10:34 PM 12/14/2019    2:36 PM  CBC  WBC 4.0 - 10.5 K/uL 13.1  15.5  9.4   Hemoglobin 12.0 - 15.0 g/dL 32.1  22.4  82.5   Hematocrit 36.0 - 46.0 % 39.8  41.3  40.3   Platelets 150 - 400 K/uL 229  238  224       Latest Ref Rng & Units 07/13/2022    5:25 PM 12/07/2019   10:46 PM 11/26/2019   11:49 AM  CMP  Glucose 70 - 99 mg/dL 90  003  89   BUN 6 - 20 mg/dL 9  14  10    Creatinine 0.44 - 1.00 mg/dL  7.04  8.88   Sodium 135 - 145 mmol/L 140  140  139   Potassium 3.5 - 5.1 mmol/L 3.9  4.2  4.6   Chloride 98 - 111 mmol/L 104  107  105   CO2 22 - 32 mmol/L 27  25  21    Calcium 8.9 - 10.3 mg/dL 9.3  9.5  9.7   Total Protein 6.5 - 8.1 g/dL 6.9  6.6  7.5   Total Bilirubin 0.3 - 1.2 mg/dL 0.5  0.6  9.16   Alkaline Phos 38 - 126 U/L 53  54  76   AST 15 - 41 U/L 14  13  11    ALT 0 - 44 U/L 9  12  8     Drugs of Abuse     Component Value Date/Time   LABOPIA NONE DETECTED 07/13/2022 1725   COCAINSCRNUR NONE DETECTED 07/13/2022 1725   LABBENZ NONE DETECTED 07/13/2022 1725   AMPHETMU NONE DETECTED 07/13/2022 1725   THCU NONE DETECTED 07/13/2022 1725   LABBARB NONE DETECTED 07/13/2022 1725    I-stat hCG < 5  Assessment and Plan: * Seizures (HCC) Pt with multiple seizures See Neuro consult note EEG MRI brain No AEDs for now Seizure precautions Tele monitor  History of syphilis 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 Previous negative RPR titer was 02/24/21. Treated with ceftriaxone and received 2.4 million units of Bicillin once. RPR titer has trended down over the course of the year, most recently down to 1:1 as of Oct, suggesting successful treatment. 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      Advance Care Planning:   Code Status: Full Code   Consults: Neurology  Family Communication: Family at bedside  Severity of Illness: The appropriate patient status for this patient is OBSERVATION. Observation status is judged to be reasonable and  necessary in order to provide the required intensity of service to ensure the patient's safety. The patient's presenting symptoms, physical exam findings, and initial radiographic and laboratory data in the context of their medical condition is felt to place them at decreased risk for further clinical deterioration. Furthermore, it is anticipated that the patient will be medically stable for discharge from the hospital within 2 midnights of admission.   Author: Hillary Bow., DO 07/14/2022 3:48 AM  For on call review [email or web address].

## 2022-07-14 NOTE — ED Notes (Signed)
Patient transported to MRI 

## 2022-07-14 NOTE — Progress Notes (Signed)
SAME DAY NOTE  Sleeping on arrival to room Awakens No complaints No change in exam EEG normal MRI is normal  No AED Sz precautiions If another seizure, consider AED then. Neurology follow up 8-12 weeks D/W Dr Jerral Ralph  -- Milon Dikes, MD Neurologist [facility] Pager: [phone]

## 2022-07-14 NOTE — Discharge Instructions (Signed)
Seizure precautions: °Per  [place] statutes, patients with seizures are not allowed to drive until they have been seizure-free for six months and cleared by a physician  °  °Use caution when using heavy equipment or power tools. Avoid working on ladders or at heights. Take showers instead of baths. Ensure the water temperature is not too high on the home water heater. Do not go swimming alone. Do not lock yourself in a room alone (i.e. bathroom). When caring for infants or small children, sit down when holding, feeding, or changing them to minimize risk of injury to the child in the event you have a seizure. Maintain good sleep hygiene. Avoid alcohol.  °  °If patient has another seizure, call 911 and bring them back to the ED if: °A.  The seizure lasts longer than 5 minutes.      °B.  The patient doesn't wake shortly after the seizure or has new problems such as difficulty seeing, speaking or moving following the seizure °C.  The patient was injured during the seizure °D.  The patient has a temperature over 102 F (39C) °E.  The patient vomited during the seizure and now is having trouble breathing °   °During the Seizure °  °- First, ensure adequate ventilation and place patients on the floor on their left side  °Loosen clothing around the neck and ensure the airway is patent. If the patient is clenching the teeth, do not force the mouth open with any object as this can cause severe damage °- Remove all items from the surrounding that can be hazardous. The patient may be oblivious to what's happening and may not even know what he or she is doing. °If the patient is confused and wandering, either gently guide him/her away and block access to outside areas °- Reassure the individual and be comforting °- Call 911. In most cases, the seizure ends before EMS arrives. However, there are cases when seizures may last over 3 to 5 minutes. Or the individual may have developed breathing difficulties or severe  injuries. If a pregnant patient or a person with diabetes develops a seizure, it is prudent to call an ambulance. °- Finally, if the patient does not regain full consciousness, then call EMS. Most patients will remain confused for about 45 to 90 minutes after a seizure, so you must use judgment in calling for help. °- Avoid restraints but make sure the patient is in a bed with padded side rails °- Place the individual in a lateral position with the neck slightly flexed; this will help the saliva drain from the mouth and prevent the tongue from falling backward °- Remove all nearby furniture and other hazards from the area °- Provide verbal assurance as the individual is regaining consciousness °- Provide the patient with privacy if possible °- Call for help and start treatment as ordered by the caregiver °  ° After the Seizure (Postictal Stage) °  °After a seizure, most patients experience confusion, fatigue, muscle pain and/or a headache. Thus, one should permit the individual to sleep. For the next few days, reassurance is essential. Being calm and helping reorient the person is also of importance. °  °Most seizures are painless and end spontaneously. Seizures are not harmful to others but can lead to complications such as stress on the lungs, brain and the heart. Individuals with prior lung problems may develop labored breathing and respiratory distress.  °  °

## 2022-07-14 NOTE — Assessment & Plan Note (Signed)
Pt with multiple seizures See Neuro consult note EEG MRI brain No AEDs for now Seizure precautions Tele monitor

## 2022-07-14 NOTE — ED Triage Notes (Signed)
Per EMS patient got home after today's ER visit. Per EMS patient was sleeping in bed and had another seizure. + incontinence and EMS reports postictal upon arrival. Patient drowsy but alert and oriented on arrival

## 2022-07-14 NOTE — Discharge Summary (Signed)
PATIENT DETAILS Name: Bailey Rose Age: 26 y.o. Sex: female Date of Birth: 11/11/1995 MRN: [id number]. Admitting Physician: Etta Quill, DO QP:[id number], No Pcp Per  Admit Date: 07/14/2022 Discharge date: 07/14/2022  Recommendations for Outpatient Follow-up:  Follow up with PCP in 1-2 weeks Please obtain CMP/CBC in one week Ensure follow-up with neurology If seizures recur-needs initiation of AEDs.  Admitted From:  Home  Disposition: Home   Discharge Condition: good  CODE STATUS:   Code Status: Full Code   Diet recommendation:  Diet Order             Diet Heart Room service appropriate? Yes; Fluid consistency: Thin  Diet effective now           Diet general                    Brief Summary: 26 year old female with history of migraines-presented to the hospital for evaluation of new onset of seizures.  Brief Hospital Course: New onset seizures No further seizures since admission MRI brain negative for any structural abnormalities EEG negative for seizures Discussed with neurologist-Dr Arora-no indication for AEDs at this point, continue to monitor as an outpatient closely-if seizure recurs then initiate AEDs. Epic referral to neurology sent Patient/mother aware of driving/seizure restrictions.  Obesity: Estimated body mass index is 34.34 kg/m as calculated from the following:   Height as of 07/13/22: 4\' 11"  (1.499 m).   Weight as of 07/13/22: 77.1 kg.    Discharge Diagnoses:  Principal Problem:   Seizures [facility])  Discharge Instructions:  Activity:  As tolerated   Discharge Instructions     Ambulatory referral to Neurology   Complete by: As directed    An appointment is requested in approximately: 4 weeks   Call MD for:   Complete by: As directed    Recurrent seizures   Diet general   Complete by: As directed    Discharge instructions   Complete by: As directed    Follow with Primary MD  in 1-2 weeks  Follow-up with  neurologist-Dr. Donnetta Hutching will call you with a follow-up appointment.  If you do not hear from them-please give them a call.  Please get a complete blood count and chemistry panel checked by your Primary MD at your next visit, and again as instructed by your Primary MD.  Get Medicines reviewed and adjusted: Please take all your medications with you for your next visit with your Primary MD  Laboratory/radiological data: Please request your Primary MD to go over all hospital tests and procedure/radiological results at the follow up, please ask your Primary MD to get all Hospital records sent to his/her office.  In some cases, they will be blood work, cultures and biopsy results pending at the time of your discharge. Please request that your primary care M.D. follows up on these results.  Also Note the following: If you experience worsening of your admission symptoms, develop shortness of breath, life threatening emergency, suicidal or homicidal thoughts you must seek medical attention immediately by calling 911 or calling your MD immediately  if symptoms less severe.  You must read complete instructions/literature along with all the possible adverse reactions/side effects for all the Medicines you take and that have been prescribed to you. Take any new Medicines after you have completely understood and accpet all the possible adverse reactions/side effects.   Do not drive when taking Pain medications or sleeping medications (Benzodaizepines)  Do not take more than prescribed Pain, Sleep and  Anxiety Medications. It is not advisable to combine anxiety,sleep and pain medications without talking with your primary care practitioner  Special Instructions: If you have smoked or chewed Tobacco  in the last 2 yrs please stop smoking, stop any regular Alcohol  and or any Recreational drug use.  Wear Seat belts while driving.  Please note: You were cared for by a hospitalist during your hospital  stay. Once you are discharged, your primary care physician will handle any further medical issues. Please note that NO REFILLS for any discharge medications will be authorized once you are discharged, as it is imperative that you return to your primary care physician (or establish a relationship with a primary care physician if you do not have one) for your post hospital discharge needs so that they can reassess your need for medications and monitor your lab values.     Seizure precautions: Per [facility] statutes, patients with seizures are not allowed to drive until they have been seizure-free for six months and cleared by a physician    Use caution when using heavy equipment or power tools. Avoid working on ladders or at heights. Take showers instead of baths. Ensure the water temperature is not too high on the home water heater. Do not go swimming alone. Do not lock yourself in a room alone (i.e. bathroom). When caring for infants or small children, sit down when holding, feeding, or changing them to minimize risk of injury to the child in the event you have a seizure. Maintain good sleep hygiene. Avoid alcohol.    If patient has another seizure, call 911 and bring them back to the ED if: A.  The seizure lasts longer than 5 minutes.      B.  The patient doesn't wake shortly after the seizure or has new problems such as difficulty seeing, speaking or moving following the seizure C.  The patient was injured during the seizure D.  The patient has a temperature over 102 F (39C) E.  The patient vomited during the seizure and now is having trouble breathing    During the Seizure   - First, ensure adequate ventilation and place patients on the floor on their left side  Loosen clothing around the neck and ensure the airway is patent. If the patient is clenching the teeth, do not force the mouth open with any object as this can cause severe damage - Remove all items from the surrounding that can  be hazardous. The patient may be oblivious to what's happening and may not even know what he or she is doing. If the patient is confused and wandering, either gently guide him/her away and block access to outside areas - Reassure the individual and be comforting - Call 911. In most cases, the seizure ends before EMS arrives. However, there are cases when seizures may last over 3 to 5 minutes. Or the individual may have developed breathing difficulties or severe injuries. If a pregnant patient or a person with diabetes develops a seizure, it is prudent to call an ambulance. - Finally, if the patient does not regain full consciousness, then call EMS. Most patients will remain confused for about 45 to 90 minutes after a seizure, so you must use judgment in calling for help. - Avoid restraints but make sure the patient is in a bed with padded side rails - Place the individual in a lateral position with the neck slightly flexed; this will help the saliva drain from the mouth and  prevent the tongue from falling backward - Remove all nearby furniture and other hazards from the area - Provide verbal assurance as the individual is regaining consciousness - Provide the patient with privacy if possible - Call for help and start treatment as ordered by the caregiver    After the Seizure (Postictal Stage)   After a seizure, most patients experience confusion, fatigue, muscle pain and/or a headache. Thus, one should permit the individual to sleep. For the next few days, reassurance is essential. Being calm and helping reorient the person is also of importance.   Most seizures are painless and end spontaneously. Seizures are not harmful to others but can lead to complications such as stress on the lungs, brain and the heart. Individuals with prior lung problems may develop labored breathing and respiratory distress.    Increase activity slowly   Complete by: As directed       Allergies as of August 09, 2022        Reactions   Latex Itching   Reports itching and irritation with condom use.        Medication List     TAKE these medications    acetaminophen 325 MG tablet Commonly known as: TYLENOL Take 2 tablets (650 mg total) by mouth every 6 (six) hours as needed for mild pain (or Fever >/= 101).        Follow-up Information     Pieter Partridge, DO Follow up.   Specialty: Neurology Why: Hospital follow up, Office will call with date/time, If you dont hear from them,please give them a call Contact information: [address] [place] [address] [phone]         Primary care MD. Schedule an appointment as soon as possible for a visit in 1 week(s).                 Allergies  Allergen Reactions   Latex Itching    Reports itching and irritation with condom use.     Other Procedures/Studies: EEG adult  Result Date: 08-09-22 Lora Havens, MD     08/09/2022  1:41 PM Patient Name: Bailey Rose MRN: [id number] Epilepsy Attending: Lora Havens Referring Physician/Provider: Lyndel Safe Date: 2022-08-09 Duration: 26.26 mins Patient history: 26 y.o. female with a history of migraines who presents with new onset seizures. EEG to evaluate for seizure. Level of alertness: Awake, asleep AEDs during EEG study: Ativan Technical aspects: This EEG study was done with scalp electrodes positioned according to the 10-20 International system of electrode placement. Electrical activity was reviewed with band pass filter of 1-70Hz , sensitivity of 7 uV/mm, display speed of 63mm/sec with a 60Hz  notched filter applied as appropriate. EEG data were recorded continuously and digitally stored.  Video monitoring was available and reviewed as appropriate. Description: The posterior dominant rhythm consists of 9-10 Hz activity of moderate voltage (25-35 uV) seen predominantly in posterior head regions, symmetric and reactive to eye opening and eye closing. Sleep was  characterized by vertex waves, sleep spindles (12 to 14 Hz), maximal frontocentral region. Physiologic photic driving was not seen during photic stimulation. Hyperventilation was not performed.   IMPRESSION: This study is within normal limits. No seizures or epileptiform discharges were seen throughout the recording. A normal interictal EEG does not exclude  the diagnosis of epilepsy. Lora Havens   MR Brain W and Wo Contrast  Result Date: Aug 09, 2022 CLINICAL DATA:  First-time seizure.  History of migraines. EXAM: MRI HEAD  WITHOUT AND WITH CONTRAST TECHNIQUE: Multiplanar, multiecho pulse sequences of the brain and surrounding structures were obtained without and with intravenous contrast. CONTRAST:  28mL GADAVIST GADOBUTROL 1 MMOL/ML IV SOLN COMPARISON:  03/01/2021 FINDINGS: Brain: No acute infarction, hemorrhage, hydrocephalus, extra-axial collection or mass lesion. No chronic microhemorrhage. Normal white matter signal and CSF spaces. Normal midline structures. No abnormal contrast enhancement. Vascular: Normal flow voids. Skull and upper cervical spine: Normal marrow signal. Sinuses/Orbits: Negative. Other: None IMPRESSION: Normal MRI of the brain. Electronically Signed   By: Deatra Robinson M.D.   On: 07/24/22 02:21   CT Head Wo Contrast  Result Date: 07/13/2022 CLINICAL DATA:  Seizure, new-onset, no history of trauma EXAM: CT HEAD WITHOUT CONTRAST TECHNIQUE: Contiguous axial images were obtained from the base of the skull through the vertex without intravenous contrast. RADIATION DOSE REDUCTION: This exam was performed according to the departmental dose-optimization program which includes automated exposure control, adjustment of the mA and/or kV according to patient size and/or use of iterative reconstruction technique. COMPARISON:  08/31/2020 FINDINGS: Brain: No acute intracranial abnormality. Specifically, no hemorrhage, hydrocephalus, mass lesion, acute infarction, or significant intracranial  injury. Vascular: No hyperdense vessel or unexpected calcification. Skull: No acute calvarial abnormality. Sinuses/Orbits: No acute findings Other: None IMPRESSION: Normal study. Electronically Signed   By: Charlett Nose M.D.   On: 07/13/2022 17:40     TODAY-DAY OF DISCHARGE:  Subjective:   Bailey Rose today has no headache,no chest abdominal pain,no new weakness tingling or numbness, feels much better wants to go home today.   Objective:   Blood pressure 103/73, pulse 88, temperature 98.8 F (37.1 C), temperature source Oral, resp. rate 16, SpO2 99 %.  Intake/Output Summary (Last 24 hours) at 07/24/22 1410 Last data filed at July 24, 2022 1000 Gross per 24 hour  Intake 240 ml  Output --  Net 240 ml   There were no vitals filed for this visit.  Exam: Awake Alert, Oriented *3, No new F.N deficits, Normal affect [place].AT,PERRAL Supple Neck,No JVD, No cervical lymphadenopathy appriciated.  Symmetrical Chest wall movement, Good air movement bilaterally, CTAB RRR,No Gallops,Rubs or new Murmurs, No Parasternal Heave +ve B.Sounds, Abd Soft, Non tender, No organomegaly appriciated, No rebound -guarding or rigidity. No Cyanosis, Clubbing or edema, No new Rash or bruise   PERTINENT RADIOLOGIC STUDIES: EEG adult  Result Date: July 24, 2022 Charlsie Quest, MD     07-24-2022  1:41 PM Patient Name: Bailey Rose MRN: [id number] Epilepsy Attending: Charlsie Quest Referring Physician/Provider: Mare Ferrari, PA-C Date: 2022-07-24 Duration: 26.26 mins Patient history: 26 y.o. female with a history of migraines who presents with new onset seizures. EEG to evaluate for seizure. Level of alertness: Awake, asleep AEDs during EEG study: Ativan Technical aspects: This EEG study was done with scalp electrodes positioned according to the 10-20 International system of electrode placement. Electrical activity was reviewed with band pass filter of 1-70Hz , sensitivity of 7 uV/mm, display speed of  58mm/sec with a 60Hz  notched filter applied as appropriate. EEG data were recorded continuously and digitally stored.  Video monitoring was available and reviewed as appropriate. Description: The posterior dominant rhythm consists of 9-10 Hz activity of moderate voltage (25-35 uV) seen predominantly in posterior head regions, symmetric and reactive to eye opening and eye closing. Sleep was characterized by vertex waves, sleep spindles (12 to 14 Hz), maximal frontocentral region. Physiologic photic driving was not seen during photic stimulation. Hyperventilation was not performed.   IMPRESSION: This study is within normal limits. No seizures  or epileptiform discharges were seen throughout the recording. A normal interictal EEG does not exclude  the diagnosis of epilepsy. Lora Havens   MR Brain W and Wo Contrast  Result Date: 07/14/2022 CLINICAL DATA:  First-time seizure.  History of migraines. EXAM: MRI HEAD WITHOUT AND WITH CONTRAST TECHNIQUE: Multiplanar, multiecho pulse sequences of the brain and surrounding structures were obtained without and with intravenous contrast. CONTRAST:  32mL GADAVIST GADOBUTROL 1 MMOL/ML IV SOLN COMPARISON:  03/01/2021 FINDINGS: Brain: No acute infarction, hemorrhage, hydrocephalus, extra-axial collection or mass lesion. No chronic microhemorrhage. Normal white matter signal and CSF spaces. Normal midline structures. No abnormal contrast enhancement. Vascular: Normal flow voids. Skull and upper cervical spine: Normal marrow signal. Sinuses/Orbits: Negative. Other: None IMPRESSION: Normal MRI of the brain. Electronically Signed   By: Ulyses Jarred M.D.   On: 07/14/2022 02:21   CT Head Wo Contrast  Result Date: 07/13/2022 CLINICAL DATA:  Seizure, new-onset, no history of trauma EXAM: CT HEAD WITHOUT CONTRAST TECHNIQUE: Contiguous axial images were obtained from the base of the skull through the vertex without intravenous contrast. RADIATION DOSE REDUCTION: This exam was  performed according to the departmental dose-optimization program which includes automated exposure control, adjustment of the mA and/or kV according to patient size and/or use of iterative reconstruction technique. COMPARISON:  08/31/2020 FINDINGS: Brain: No acute intracranial abnormality. Specifically, no hemorrhage, hydrocephalus, mass lesion, acute infarction, or significant intracranial injury. Vascular: No hyperdense vessel or unexpected calcification. Skull: No acute calvarial abnormality. Sinuses/Orbits: No acute findings Other: None IMPRESSION: Normal study. Electronically Signed   By: Rolm Baptise M.D.   On: 07/13/2022 17:40     PERTINENT LAB RESULTS: CBC: Recent Labs    07/13/22 1725 07/14/22 0624  WBC 13.1* 11.3*  HGB 13.6 12.6  HCT 39.8 36.7  PLT 229 208   CMET CMP     Component Value Date/Time   NA 138 07/14/2022 0624   NA 139 11/26/2019 1149   K 3.5 07/14/2022 0624   CL 107 07/14/2022 0624   CO2 23 07/14/2022 0624   GLUCOSE 90 07/14/2022 0624   BUN 13 07/14/2022 0624   BUN 10 11/26/2019 1149   CREATININE 0.71 07/14/2022 0624   CREATININE 0.62 02/26/2015 1351   CALCIUM 8.9 07/14/2022 0624   PROT 6.9 07/13/2022 1725   PROT 7.5 11/26/2019 1149   ALBUMIN 3.9 07/13/2022 1725   ALBUMIN 4.5 11/26/2019 1149   AST 14 (L) 07/13/2022 1725   ALT 9 07/13/2022 1725   ALKPHOS 53 07/13/2022 1725   BILITOT 0.5 07/13/2022 1725   BILITOT <0.2 11/26/2019 1149   GFRNONAA >60 07/14/2022 0624   GFRAA >60 12/07/2019 2246    GFR Estimated Creatinine Clearance: 97.2 mL/min (by C-G formula based on SCr of 0.71 mg/dL). No results for input(s): "LIPASE", "AMYLASE" in the last 72 hours. No results for input(s): "CKTOTAL", "CKMB", "CKMBINDEX", "TROPONINI" in the last 72 hours. Invalid input(s): "POCBNP" No results for input(s): "DDIMER" in the last 72 hours. No results for input(s): "HGBA1C" in the last 72 hours. No results for input(s): "CHOL", "HDL", "LDLCALC", "TRIG", "CHOLHDL",  "LDLDIRECT" in the last 72 hours. No results for input(s): "TSH", "T4TOTAL", "T3FREE", "THYROIDAB" in the last 72 hours.  Invalid input(s): "FREET3" No results for input(s): "VITAMINB12", "FOLATE", "FERRITIN", "TIBC", "IRON", "RETICCTPCT" in the last 72 hours. Coags: No results for input(s): "INR" in the last 72 hours.  Invalid input(s): "PT" Microbiology: No results found for this or any previous visit (from the past 240 hour(s)).  FURTHER DISCHARGE INSTRUCTIONS:  Get Medicines reviewed and adjusted: Please take all your medications with you for your next visit with your Primary MD  Laboratory/radiological data: Please request your Primary MD to go over all hospital tests and procedure/radiological results at the follow up, please ask your Primary MD to get all Hospital records sent to his/her office.  In some cases, they will be blood work, cultures and biopsy results pending at the time of your discharge. Please request that your primary care M.D. goes through all the records of your hospital data and follows up on these results.  Also Note the following: If you experience worsening of your admission symptoms, develop shortness of breath, life threatening emergency, suicidal or homicidal thoughts you must seek medical attention immediately by calling 911 or calling your MD immediately  if symptoms less severe.  You must read complete instructions/literature along with all the possible adverse reactions/side effects for all the Medicines you take and that have been prescribed to you. Take any new Medicines after you have completely understood and accpet all the possible adverse reactions/side effects.   Do not drive when taking Pain medications or sleeping medications (Benzodaizepines)  Do not take more than prescribed Pain, Sleep and Anxiety Medications. It is not advisable to combine anxiety,sleep and pain medications without talking with your primary care practitioner  Special  Instructions: If you have smoked or chewed Tobacco  in the last 2 yrs please stop smoking, stop any regular Alcohol  and or any Recreational drug use.  Wear Seat belts while driving.  Please note: You were cared for by a hospitalist during your hospital stay. Once you are discharged, your primary care physician will handle any further medical issues. Please note that NO REFILLS for any discharge medications will be authorized once you are discharged, as it is imperative that you return to your primary care physician (or establish a relationship with a primary care physician if you do not have one) for your post hospital discharge needs so that they can reassess your need for medications and monitor your lab values.  Total Time spent coordinating discharge including counseling, education and face to face time equals less than 30 minutes.  Signed: Angely Dietz 07/14/2022 2:10 PM

## 2022-07-18 ENCOUNTER — Encounter: Payer: Self-pay | Admitting: Neurology

## 2022-07-25 ENCOUNTER — Encounter: Payer: Medicaid Other | Admitting: Internal Medicine
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
## 2022-07-27 ENCOUNTER — Encounter: Payer: Self-pay | Admitting: Internal Medicine

## 2022-07-27 ENCOUNTER — Other Ambulatory Visit: Payer: Self-pay

## 2022-07-27 ENCOUNTER — Ambulatory Visit: Payer: Medicaid Other | Admitting: Internal Medicine

## 2022-07-27 VITALS — BP 106/65 | HR 88 | Temp 98.2°F | Ht 59.0 in | Wt 176.8 lb

## 2022-07-27 DIAGNOSIS — R569 Unspecified convulsions: Secondary | ICD-10-CM | POA: Diagnosis not present

## 2022-07-27 DIAGNOSIS — F339 Major depressive disorder, recurrent, unspecified: Secondary | ICD-10-CM | POA: Diagnosis not present

## 2022-07-27 DIAGNOSIS — G43909 Migraine, unspecified, not intractable, without status migrainosus: Secondary | ICD-10-CM

## 2022-07-27 DIAGNOSIS — Z0000 Encounter for general adult medical examination without abnormal findings: Secondary | ICD-10-CM

## 2022-07-27 NOTE — Patient Instructions (Addendum)
[name], it was a pleasure seeing you today! You endorsed feeling well today. Below are some of the things we talked about this visit. We look forward to seeing you in the follow up appointment!  Today we discussed: You presented for a seizure. Please follow up with neurology. Do not drive for 6 months and until cleared by neurology or your primary care doctor.  We will see you in 6 months for pap smear and follow up.  I will place a referral for therapy for your depression.   I have ordered the following labs today:  Lab Orders  No laboratory test(s) ordered today      Referrals ordered today:   Referral Orders         Ambulatory referral to [place]       I have ordered the following medication/changed the following medications:   Stop the following medications: There are no discontinued medications.   Start the following medications: No orders of the defined types were placed in this encounter.    Follow-up: 6 month follow up  Please make sure to arrive 15 minutes prior to your next appointment. If you arrive late, you may be asked to reschedule.   We look forward to seeing you next time. Please call our clinic at [phone] if you have any questions or concerns. The best time to call is Monday-Friday from 9am-4pm, but there is someone available 24/7. If after hours or the weekend, call the main hospital number and ask for the Internal Medicine Resident On-Call. If you need medication refills, please notify your pharmacy one week in advance and they will send [place] a request.  Thank you for letting us take part in your care. Wishing you the best!  Thank you, Idamae Schuller, MD

## 2022-07-29 ENCOUNTER — Encounter: Payer: Self-pay | Admitting: Internal Medicine

## 2022-07-29 DIAGNOSIS — G43909 Migraine, unspecified, not intractable, without status migrainosus: Secondary | ICD-10-CM | POA: Insufficient documentation

## 2022-07-29 DIAGNOSIS — Z0000 Encounter for general adult medical examination without abnormal findings: Secondary | ICD-10-CM | POA: Insufficient documentation

## 2022-07-29 NOTE — Assessment & Plan Note (Signed)
Declined flu vaccine. Pap due in 5 months.
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
## 2022-08-01 NOTE — Progress Notes (Signed)
Internal Medicine Clinic Attending  Case discussed with Dr. Khan  At the time of the visit.  We reviewed the resident's history and exam and pertinent patient test results.  I agree with the assessment, diagnosis, and plan of care documented in the resident's note.  
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
## 2022-08-09 ENCOUNTER — Encounter: Payer: Self-pay | Admitting: Neurology

## 2022-08-09 ENCOUNTER — Ambulatory Visit: Payer: Medicaid Other | Admitting: Neurology

## 2022-08-09 VITALS — BP 117/77 | HR 78 | Ht 59.0 in | Wt 176.8 lb

## 2022-08-09 DIAGNOSIS — R569 Unspecified convulsions: Secondary | ICD-10-CM | POA: Diagnosis not present

## 2022-08-09 DIAGNOSIS — Z87820 Personal history of traumatic brain injury: Secondary | ICD-10-CM | POA: Diagnosis not present
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
## 2022-08-10 ENCOUNTER — Encounter: Payer: Medicaid Other | Admitting: Student

## 2022-08-10 ENCOUNTER — Other Ambulatory Visit: Payer: Self-pay

## 2022-08-10 ENCOUNTER — Encounter (HOSPITAL_COMMUNITY): Payer: Self-pay

## 2022-08-10 ENCOUNTER — Ambulatory Visit (HOSPITAL_COMMUNITY)
Admission: RE | Admit: 2022-08-10 | Discharge: 2022-08-10 | Disposition: A | Discharge status: Home / Self Care | Payer: Medicaid Other | Source: Ambulatory Visit | Attending: Internal Medicine | Admitting: Internal Medicine

## 2022-08-10 ENCOUNTER — Ambulatory Visit (HOSPITAL_COMMUNITY): Payer: Self-pay

## 2022-08-10 VITALS — BP 122/78 | HR 110 | Temp 99.9°F | Resp 19

## 2022-08-10 DIAGNOSIS — N760 Acute vaginitis: Secondary | ICD-10-CM | POA: Insufficient documentation

## 2022-08-10 LAB — POCT URINALYSIS DIPSTICK, ED / UC
Bilirubin Urine: NEGATIVE
Glucose, UA: NEGATIVE mg/dL
Hgb urine dipstick: NEGATIVE
Ketones, ur: NEGATIVE mg/dL
Nitrite: NEGATIVE
Protein, ur: 30 mg/dL — AB
Specific Gravity, Urine: 1.02 (ref 1.005–1.030)
Urobilinogen, UA: 1 mg/dL (ref 0.0–1.0)
pH: 7 (ref 5.0–8.0)

## 2022-08-10 LAB — POC URINE PREG, ED: Preg Test, Ur: NEGATIVE

## 2022-08-10 NOTE — ED Triage Notes (Signed)
Requesting STD testing.  Reports vaginal discharge, described as white, odor to discharge, and has itchy sensation.    UTI symptoms started 2 days: frequently urinating, difficulty starting stream.  Patient has some abdominal pain.  Left flank pain.    Has had probiotics and monistat.

## 2022-08-10 NOTE — ED Notes (Signed)
Verified that cyto specimen was in lab and properly labeled

## 2022-08-10 NOTE — Discharge Instructions (Signed)
We will call you with recommendation if labs are abnormal If you see a lab results in [place] before we do please call us back Avoid any sexual intercourse until lab results are available Return to urgent care if you have any other concerns. Increase oral fluid intake.

## 2022-08-10 NOTE — ED Provider Notes (Signed)
[place]    CSN: [id number] Arrival date & time: 08/10/22  1553      History   Chief Complaint Chief Complaint  Patient presents with   Appointment    16:30   SEXUALLY TRANSMITTED DISEASE    HPI Bailey Rose is a 27 y.o. female. comes to the urgent care for itchy white vaginal discharge of a few days duration.  2 weeks ago patient treated herself for vaginal yeast infection and bacterial vaginosis using over-the-counter medications.  Her symptoms subsided with the over-the-counter medication and started again a couple of days ago.  Vaginal discharge is somewhat thick, white and associated with vaginal itching.  She also complains of dysuria with no frequency or urgency.  She has lower abdominal discomfort with no nausea or vomiting or flank pain.  She is sexually active with the same sexual partner.  Last menstrual period was 2 weeks ago.  No vaginal rash.  HPI  Past Medical History:  Diagnosis Date   Acne cystica 02/10/2015   Asthma    Delayed gastric emptying 01/08/2020   Related to progesterone effect of pregnancy  Rx Reglan 5mg  q8h prn   Dysmenorrhea 06/24/2013   Migraines    Polycystic ovarian syndrome    Premenstrual syndrome 05/01/2021   Sickle cell trait [facility])     Patient Active Problem List   Diagnosis Date Noted   Migraines 07/29/2022   Health care maintenance 07/29/2022   History of syphilis 07/14/2022   Seizures ([place]) 07/14/2022   PCOS (polycystic ovarian syndrome) 03/18/2015    Past Surgical History:  Procedure Laterality Date   TONSILLECTOMY     WISDOM TOOTH EXTRACTION  2017    OB History     Gravida  3   Para  0   Term  0   Preterm  0   AB  2   Living  0      SAB  2   IAB  0   Ectopic  0   Multiple  0   Live Births  0            Home Medications    Prior to Admission medications   Medication Sig Start Date End Date Taking? Authorizing Provider  acetaminophen (TYLENOL) 325 MG tablet Take 2 tablets  (650 mg total) by mouth every 6 (six) hours as needed for mild pain (or Fever >/= 101). 07/14/22   Ghimire, Henreitta Leber, MD  ferrous sulfate 325 (65 FE) MG EC tablet Take 325 mg by mouth 3 (three) times daily with meals.    [provider]  Multiple Vitamins-Minerals (HAIR/SKIN/NAILS/BIOTIN PO) Take by mouth.    [provider]  Probiotic Product (PROBIOTIC-10 PO) Take by mouth.    [provider]    Family History Family History  Problem Relation Age of Onset   Sickle cell anemia Mother    Breast cancer Mother    Cancer Maternal Grandmother        lung   Migraines Maternal Grandmother    Lupus Maternal Grandmother     Social History Social History   Tobacco Use   Smoking status: Some Days    Packs/day: 0.25    Years: 2.00    Total pack years: 0.50    Types: Cigarettes, E-cigarettes   Smokeless tobacco: Never   Tobacco comments:    Smoking 3-4 cigs per day  Vaping Use   Vaping Use: Never used  Substance Use Topics   Alcohol use: Yes  Comment: States socially/ while on vacation   Drug use: Not Currently    Types: Marijuana     Allergies   Latex   Review of Systems Review of Systems  All other systems reviewed and are negative.    Physical Exam Triage Vital Signs ED Triage Vitals  Enc Vitals Group     BP 08/10/22 1633 122/78     Pulse Rate 08/10/22 1633 (!) 110     Resp 08/10/22 1633 19     Temp 08/10/22 1633 99.9 F (37.7 C)     Temp Source 08/10/22 1633 Oral     SpO2 08/10/22 1633 96 %     Weight --      Height --      Head Circumference --      Peak Flow --      Pain Score 08/10/22 1630 0     Pain Loc --      Pain Edu? --      Excl. in [place]? --    No data found.  Updated Vital Signs BP 122/78 (BP Location: Right Arm) Comment (BP Location): large cuff  Pulse (!) 110   Temp 99.9 F (37.7 C) (Oral)   Resp 19   LMP 07/26/2022   SpO2 96%   Visual Acuity Right Eye Distance:   Left Eye Distance:   Bilateral  Distance:    Right Eye Near:   Left Eye Near:    Bilateral Near:     Physical Exam Vitals and nursing note reviewed.  Constitutional:      General: She is not in acute distress.    Appearance: She is not ill-appearing.  Cardiovascular:     Rate and Rhythm: Normal rate and regular rhythm.     Pulses: Normal pulses.     Heart sounds: Normal heart sounds.  Pulmonary:     Effort: Pulmonary effort is normal.     Breath sounds: Normal breath sounds.  Musculoskeletal:     Cervical back: Normal range of motion.  Neurological:     Mental Status: She is alert.      UC Treatments / Results  Labs (all labs ordered are listed, but only abnormal results are displayed) Labs Reviewed  POCT URINALYSIS DIPSTICK, ED / UC - Abnormal; Notable for the following components:      Result Value   Protein, ur 30 (*)    Leukocytes,Ua SMALL (*)    All other components within normal limits  POC URINE PREG, ED  CERVICOVAGINAL ANCILLARY ONLY    EKG   Radiology No results found.  Procedures Procedures (including critical care time)  Medications Ordered in UC Medications - No data to display  Initial Impression / Assessment and Plan / UC Course  I have reviewed the triage vital signs and the nursing notes.  Pertinent labs & imaging results that were available during my care of the patient were reviewed by me and considered in my medical decision making (see chart for details).     1.  Acute vaginitis: Point-of-care urinalysis is negative for UTI Cervicovaginal swab for GC/chlamydia/trichomonas Will call patient with recommendations if labs are abnormal Safe sex practices advised. Final Clinical Impressions(s) / UC Diagnoses   Final diagnoses:  Acute vaginitis     Discharge Instructions      We will call you with recommendation if labs are abnormal If you see a lab results in [place] before we do please call us back Avoid any sexual intercourse until lab results  are  available Return to urgent care if you have any other concerns. Increase oral fluid intake.   ED Prescriptions   None    PDMP not reviewed this encounter.   Merrilee Jansky, MD 08/10/22 [phone]

## 2022-08-11 ENCOUNTER — Telehealth (HOSPITAL_COMMUNITY): Payer: Self-pay | Admitting: Emergency Medicine

## 2022-08-11 LAB — CERVICOVAGINAL ANCILLARY ONLY
Bacterial Vaginitis (gardnerella): POSITIVE — AB
Candida Glabrata: NEGATIVE
Candida Vaginitis: NEGATIVE
Chlamydia: NEGATIVE
Comment: NEGATIVE
Comment: NEGATIVE
Comment: NEGATIVE
Comment: NEGATIVE
Comment: NEGATIVE
Comment: NORMAL
Neisseria Gonorrhea: NEGATIVE
Trichomonas: NEGATIVE

## 2022-08-11 MED ORDER — METRONIDAZOLE 500 MG PO TABS: 500.0000 mg | ORAL_TABLET | Freq: Two times a day (BID) | ORAL | 0 refills | Status: DC

## 2022-08-11 MED ORDER — FLUCONAZOLE 150 MG PO TABS: ORAL_TABLET | ORAL | 0 refills | Status: DC

## 2022-08-11 NOTE — Telephone Encounter (Signed)
Patient called Urgent Care due to positive bacterial vaginosis test results.  This Probation officer called patient back and patient identity was verified.  Treatment for bacterial vaginosis, Flagyl, was sent to the pharmacy, patient was made aware of treatment regiment, refraining from alcohol intake and possible side effects.  Patient requested Diflucan due to yeast infection side effects from antimicrobial treatment.  Diflucan medication was sent to the pharmacy.  Patient was made aware of treatment regiment. Last menstrual period was 2 weeks ago. Patient verbalized understanding of instructions.

## 2022-08-14 ENCOUNTER — Encounter: Payer: Medicaid Other | Admitting: Student

## 2022-08-15 ENCOUNTER — Telehealth: Payer: Self-pay | Admitting: Neurology

## 2022-08-15 NOTE — Telephone Encounter (Signed)
Pt called in and left a message. She stated she was taking antibiotics and they were messing with her memory. She was told to call our office and see if she should be taking them.

## 2022-08-15 NOTE — Telephone Encounter (Signed)
Per Dr.Jaffe, This is not a neurologic issue.  This needs to be addressed with the prescribing provider.   LMOVM for the patient to call the office back.

## 2022-08-16 NOTE — Telephone Encounter (Signed)
Patient advised of Dr.jaffe note.

## 2022-08-21 ENCOUNTER — Ambulatory Visit: Payer: Self-pay | Admitting: Licensed Clinical Social Worker

## 2022-08-21 ENCOUNTER — Encounter: Payer: Self-pay | Admitting: Licensed Clinical Social Worker

## 2022-08-23 ENCOUNTER — Ambulatory Visit: Payer: Medicaid Other | Admitting: Licensed Clinical Social Worker

## 2022-08-30 ENCOUNTER — Ambulatory Visit (INDEPENDENT_AMBULATORY_CARE_PROVIDER_SITE_OTHER): Payer: Medicaid Other | Admitting: Licensed Clinical Social Worker

## 2022-08-30 DIAGNOSIS — F339 Major depressive disorder, recurrent, unspecified: Secondary | ICD-10-CM
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
## 2022-08-31 ENCOUNTER — Ambulatory Visit: Payer: Medicaid Other | Admitting: Neurology

## 2022-08-31 DIAGNOSIS — R569 Unspecified convulsions: Secondary | ICD-10-CM | POA: Diagnosis not present

## 2022-08-31 NOTE — Progress Notes (Signed)
Ambulatory EEG hooked up and running. Light flashing. Push button tested. event log explained. Patient does not want to use camera. Tech explained the benefit of camera with AMB EEG however patient still does not want to take it. Dr. Tomi Likens notified.

## 2022-09-01 ENCOUNTER — Telehealth: Payer: Self-pay | Admitting: Neurology

## 2022-09-01 NOTE — Telephone Encounter (Signed)
Patient advised of her EEG results.  Patient request to have a visit with Dr,Jaffe to see the actual test and go over together.

## 2022-09-01 NOTE — Telephone Encounter (Signed)
Ambulatory EEG is normal.  Would not start a seizure medication at this time.

## 2022-09-01 NOTE — Progress Notes (Signed)
ELECTROENCEPHALOGRAM REPORT  Dates of Recording: 08/31/2022 at 08:56 to 09/01/2022 at 12:32  Patient's Name: Bailey Rose MRN: [id number] Date of Birth: 1995/10/03  Procedure: 24-hour ambulatory EEG  History: 27 year old female with new onset seizure  Medications:  Current Outpatient Medications on File Prior to Visit  Medication Sig Dispense Refill   acetaminophen (TYLENOL) 325 MG tablet Take 2 tablets (650 mg total) by mouth every 6 (six) hours as needed for mild pain (or Fever >/= 101).     ferrous sulfate 325 (65 FE) MG EC tablet Take 325 mg by mouth 3 (three) times daily with meals.     fluconazole (DIFLUCAN) 150 MG tablet Take 1 tablet today and 1 tablet 3 days from initiation. 2 tablet 0   metroNIDAZOLE (FLAGYL) 500 MG tablet Take 1 tablet (500 mg total) by mouth 2 (two) times daily. 14 tablet 0   Multiple Vitamins-Minerals (HAIR/SKIN/NAILS/BIOTIN PO) Take by mouth.     Probiotic Product (PROBIOTIC-10 PO) Take by mouth.     No current facility-administered medications on file prior to visit.    Technical Summary: This is a 24-hour multichannel digital EEG recording measured by the international 10-20 system with electrodes applied with paste and impedances below 5000 ohms performed as portable with EKG monitoring.  The digital EEG was referentially recorded, reformatted, and digitally filtered in a variety of bipolar and referential montages for optimal display.    DESCRIPTION OF RECORDING: During maximal wakefulness, the background activity consisted of a symmetric 10Hz$  posterior dominant rhythm which was reactive to eye opening.  There were no epileptiform discharges or focal slowing seen in wakefulness.  During the recording, the patient progresses through wakefulness, drowsiness, and Stage 2 sleep.  Again, there were no epileptiform discharges seen.  Events:  There were no electrographic seizures seen.  EKG lead was unremarkable.  IMPRESSION: This 24-hour ambulatory EEG  study is normal.    CLINICAL CORRELATION: A normal EEG does not exclude a clinical diagnosis of epilepsy.  If further clinical questions remain, inpatient video EEG monitoring may be helpful.   Metta Clines, DO

## 2022-09-01 NOTE — Progress Notes (Signed)
AMB EEG discontined. No skin breakdown at [facility]. No diary returned.

## 2022-09-11 ENCOUNTER — Encounter: Payer: Self-pay | Admitting: Licensed Clinical Social Worker

## 2022-09-11 ENCOUNTER — Telehealth: Payer: Self-pay | Admitting: Neurology

## 2022-09-11 NOTE — Telephone Encounter (Signed)
Pt called in and left a message with the access nurse. She has received her EEG results and would like to find out what to do from here

## 2022-09-11 NOTE — Progress Notes (Signed)
[facility] spoke with patient on today. Patient 02/29 appointment to be rescheduled for 02/28 at [place], MSW, [place]  Fax [phone] Main Office Phone: [phone] [address] [place], [place] [address] Website: [place], [place]

## 2022-09-12 NOTE — Telephone Encounter (Signed)
Patient when given results on 09/01/22, no medication needed. EEG normal. No follow up needed. Monitor.

## 2022-09-13 ENCOUNTER — Ambulatory Visit (INDEPENDENT_AMBULATORY_CARE_PROVIDER_SITE_OTHER): Payer: Medicaid Other | Admitting: Licensed Clinical Social Worker

## 2022-09-13 DIAGNOSIS — F339 Major depressive disorder, recurrent, unspecified: Secondary | ICD-10-CM

## 2022-09-13 NOTE — BH Specialist Note (Signed)
[facility] contacted patient. Patient stated she is unable to make appointment on today. Patient requested appointment to be rescheduled. [facility] sent request to [facility] front desk.  Milus Height, MSW, [place]  [facility] Direct Dial:[phone]  Fax [phone] Main Office Phone: [phone] [address] [place], [place] [address] Website: [place], [place]

## 2022-09-14 ENCOUNTER — Ambulatory Visit: Payer: Medicaid Other | Admitting: Licensed Clinical Social Worker

## 2022-09-17 ENCOUNTER — Other Ambulatory Visit: Payer: Self-pay

## 2022-09-17 ENCOUNTER — Emergency Department (HOSPITAL_COMMUNITY): Payer: Medicaid Other

## 2022-09-17 ENCOUNTER — Emergency Department (HOSPITAL_COMMUNITY)
Admission: EM | Admit: 2022-09-17 | Discharge: 2022-09-17 | Disposition: A | Discharge status: Home / Self Care | Payer: Medicaid Other | Attending: Emergency Medicine | Admitting: Emergency Medicine

## 2022-09-17 ENCOUNTER — Encounter (HOSPITAL_COMMUNITY): Payer: Self-pay

## 2022-09-17 DIAGNOSIS — X58XXXA Exposure to other specified factors, initial encounter: Secondary | ICD-10-CM | POA: Diagnosis not present

## 2022-09-17 DIAGNOSIS — R569 Unspecified convulsions: Secondary | ICD-10-CM | POA: Insufficient documentation

## 2022-09-17 DIAGNOSIS — Z91040 Latex allergy status: Secondary | ICD-10-CM | POA: Diagnosis not present

## 2022-09-17 DIAGNOSIS — S00532A Contusion of oral cavity, initial encounter: Secondary | ICD-10-CM | POA: Diagnosis not present

## 2022-09-17 DIAGNOSIS — R0902 Hypoxemia: Secondary | ICD-10-CM | POA: Diagnosis not present

## 2022-09-17 LAB — CBC WITH DIFFERENTIAL/PLATELET
Abs Immature Granulocytes: 0.09 10*3/uL — ABNORMAL HIGH (ref 0.00–0.07)
Basophils Absolute: 0.1 10*3/uL (ref 0.0–0.1)
Basophils Relative: 1 %
Eosinophils Absolute: 0.2 10*3/uL (ref 0.0–0.5)
Eosinophils Relative: 2 %
HCT: 39.5 % (ref 36.0–46.0)
Hemoglobin: 13.3 g/dL (ref 12.0–15.0)
Immature Granulocytes: 1 %
Lymphocytes Relative: 30 %
Lymphs Abs: 3.3 10*3/uL (ref 0.7–4.0)
MCH: 25.2 pg — ABNORMAL LOW (ref 26.0–34.0)
MCHC: 33.7 g/dL (ref 30.0–36.0)
MCV: 75 fL — ABNORMAL LOW (ref 80.0–100.0)
Monocytes Absolute: 0.8 10*3/uL (ref 0.1–1.0)
Monocytes Relative: 7 %
Neutro Abs: 6.6 10*3/uL (ref 1.7–7.7)
Neutrophils Relative %: 59 %
Platelets: 242 10*3/uL (ref 150–400)
RBC: 5.27 MIL/uL — ABNORMAL HIGH (ref 3.87–5.11)
RDW: 13.4 % (ref 11.5–15.5)
WBC: 11 10*3/uL — ABNORMAL HIGH (ref 4.0–10.5)
nRBC: 0 % (ref 0.0–0.2)

## 2022-09-17 LAB — BASIC METABOLIC PANEL
Anion gap: 12 (ref 5–15)
BUN: 13 mg/dL (ref 6–20)
CO2: 21 mmol/L — ABNORMAL LOW (ref 22–32)
Calcium: 9 mg/dL (ref 8.9–10.3)
Chloride: 105 mmol/L (ref 98–111)
Creatinine, Ser: 0.74 mg/dL (ref 0.44–1.00)
GFR, Estimated: >60 mL/min (ref >60)
Glucose, Bld: 103 mg/dL — ABNORMAL HIGH (ref 70–99)
Potassium: 4.1 mmol/L (ref 3.5–5.1)
Sodium: 138 mmol/L (ref 135–145)

## 2022-09-17 LAB — I-STAT BETA HCG BLOOD, ED (MC, WL, AP ONLY): I-stat hCG, quantitative: <5 m[IU]/mL (ref <5)

## 2022-09-17 LAB — RAPID URINE DRUG SCREEN, HOSP PERFORMED
Amphetamines: NOT DETECTED
Barbiturates: NOT DETECTED
Benzodiazepines: NOT DETECTED
Cocaine: NOT DETECTED
Opiates: NOT DETECTED
Tetrahydrocannabinol: NOT DETECTED

## 2022-09-17 MED ORDER — LEVETIRACETAM IN NACL 1000 MG/100ML IV SOLN
1000.0000 mg | Freq: Once | INTRAVENOUS | Status: AC
  Administered 2022-09-17: 1000 mg via INTRAVENOUS
  Filled 2022-09-17: qty 100

## 2022-09-17 MED ORDER — LEVETIRACETAM 500 MG PO TABS: 500.0000 mg | ORAL_TABLET | Freq: Two times a day (BID) | ORAL | 1 refills | Status: DC

## 2022-09-17 NOTE — Discharge Instructions (Signed)
You were seen today for an episode concerning for repeat seizure.  While your previous EEGs have been normal, this does not exclude the diagnosis of seizure.  You will be started on seizure medications.  Start Keppra 500 mg twice daily.  CT head was negative for any injury.

## 2022-09-17 NOTE — ED Provider Notes (Signed)
[facility] Provider Note   CSN: [id number] Arrival date & time: 09/17/22  0139     History  Chief Complaint  Patient presents with   Seizures    Bailey Rose is a 27 y.o. female.  HPI     This is a 27 year old female who presents with concern for seizure.  Per EMS she had a tonic-clonic seizure at home witnessed by boyfriend that lasted approximately 1 minute.  Upon EMS arrival, she was clearly postictal with GCS 10.  This improved quickly to a GCS of 14.  EMS noted tongue trauma.  Patient is amnestic to the events.  Currently she reports some dizziness but no headache, weakness, numbness, tingling, strokelike symptoms.  Denies any recent illnesses or fevers. Home Medications Prior to Admission medications   Medication Sig Start Date End Date Taking? Authorizing Provider  levETIRAcetam (KEPPRA) 500 MG tablet Take 1 tablet (500 mg total) by mouth 2 (two) times daily. 09/17/22  Yes Kin Galbraith, Barbette Hair, MD  acetaminophen (TYLENOL) 325 MG tablet Take 2 tablets (650 mg total) by mouth every 6 (six) hours as needed for mild pain (or Fever >/= 101). 07/14/22   Ghimire, Henreitta Leber, MD  ferrous sulfate 325 (65 FE) MG EC tablet Take 325 mg by mouth 3 (three) times daily with meals.    [provider]  fluconazole (DIFLUCAN) 150 MG tablet Take 1 tablet today and 1 tablet 3 days from initiation. 08/11/22   Flossie Dibble, NP  metroNIDAZOLE (FLAGYL) 500 MG tablet Take 1 tablet (500 mg total) by mouth 2 (two) times daily. 08/11/22   Flossie Dibble, NP  Multiple Vitamins-Minerals (HAIR/SKIN/NAILS/BIOTIN PO) Take by mouth.    [provider]  Probiotic Product (PROBIOTIC-10 PO) Take by mouth.    [provider]      Allergies    Latex    Review of Systems   Review of Systems  Constitutional:  Negative for fever.  Respiratory:  Negative for shortness of breath.   Cardiovascular:  Negative for chest pain.   Neurological:  Positive for seizures.  All other systems reviewed and are negative.   Physical Exam Updated Vital Signs BP 98/62   Pulse 99   Temp 98 F (36.7 C) (Oral)   Resp 20   Ht 1.499 m ('4\' 11"'$ )   Wt 79.4 kg   SpO2 94%   BMI 35.35 kg/m  Physical Exam Vitals and nursing note reviewed.  Constitutional:      Appearance: She is well-developed. She is obese. She is not ill-appearing.  HENT:     Head: Normocephalic and atraumatic.     Mouth/Throat:     Comments: Contusion noted to the tip of the tongue Eyes:     Pupils: Pupils are equal, round, and reactive to light.  Cardiovascular:     Rate and Rhythm: Normal rate and regular rhythm.     Heart sounds: Normal heart sounds.  Pulmonary:     Effort: Pulmonary effort is normal. No respiratory distress.     Breath sounds: No wheezing.  Abdominal:     Palpations: Abdomen is soft.  Musculoskeletal:     Cervical back: Neck supple.  Skin:    General: Skin is warm and dry.  Neurological:     Mental Status: She is alert and oriented to person, place, and time.     Comments: Cranial nerves II through XII intact, 5 out of 5 strength in all 4 extremities, no  dysmetria to finger-nose-finger  Psychiatric:        Mood and Affect: Mood normal.     ED Results / Procedures / Treatments   Labs (all labs ordered are listed, but only abnormal results are displayed) Labs Reviewed  CBC WITH DIFFERENTIAL/PLATELET - Abnormal; Notable for the following components:      Result Value   WBC 11.0 (*)    RBC 5.27 (*)    MCV 75.0 (*)    MCH 25.2 (*)    Abs Immature Granulocytes 0.09 (*)    All other components within normal limits  BASIC METABOLIC PANEL - Abnormal; Notable for the following components:   CO2 21 (*)    Glucose, Bld 103 (*)    All other components within normal limits  RAPID URINE DRUG SCREEN, HOSP PERFORMED  CBG MONITORING, ED  I-STAT BETA HCG BLOOD, ED ([facility], [facility], [facility] ONLY)    EKG None  Radiology CT Head Wo  Contrast  Result Date: 09/17/2022 CLINICAL DATA:  Seizure, new onset. EXAM: CT HEAD WITHOUT CONTRAST TECHNIQUE: Contiguous axial images were obtained from the base of the skull through the vertex without intravenous contrast. RADIATION DOSE REDUCTION: This exam was performed according to the departmental dose-optimization program which includes automated exposure control, adjustment of the mA and/or kV according to patient size and/or use of iterative reconstruction technique. COMPARISON:  07/05/2022. FINDINGS: Brain: No acute intracranial hemorrhage, midline shift or mass effect. No extra-axial fluid collection. Gray-white matter differentiation is within normal limits. No hydrocephalus. Vascular: No hyperdense vessel or unexpected calcification. Skull: Normal. Negative for fracture or focal lesion. Sinuses/Orbits: Mild mucosal thickening in the ethmoid air cells and sphenoid sinuses. No acute orbital abnormality. Other: None. IMPRESSION: No acute intracranial process. Electronically Signed   By: Brett Fairy M.D.   On: 09/17/2022 04:49    Procedures Procedures    Medications Ordered in ED Medications  levETIRAcetam (KEPPRA) IVPB 1000 mg/100 mL premix (0 mg Intravenous Stopped 09/17/22 0251)    ED Course/ Medical Decision Making/ A&P Clinical Course as of 09/17/22 0457  Sun Sep 17, 2022  0200 Spoke to Dr. Lorrin Goodell.  Recommends loading with Keppra and starting 500 mg twice daily for presumed seizure.  Added UDS.  Pregnancy pending. [CH]  [id number] Updated mother at the bedside.  She is concerned that the patient hit her head last night prior to onset of seizure.  Patient is not having any headache and no obvious signs of trauma; however, given this piece of history, will obtain CT. [CH]    Clinical Course User Index [CH] Fawaz Borquez, Barbette Hair, MD                             Medical Decision Making Amount and/or Complexity of Data Reviewed Labs: ordered. Radiology: ordered.  Risk Prescription  drug management.   This patient presents to the ED for concern of seizure, this involves an extensive number of treatment options, and is a complaint that carries with it a high risk of complications and morbidity.  I considered the following differential and admission for this acute, potentially life threatening condition.  The differential diagnosis includes epileptic seizure, nonepileptic event, syncope  MDM:    This is a 27 year old female who presents with an episode concerning for seizure.  Per EMS she was postictal and had a witnessed tonic-clonic event.  She is overall nontoxic and vital signs are reassuring on my initial evaluation.  Her neurologic exam  is normal.  She does have evidence of tongue trauma.  I feel her history and physical exam is consistent with likely seizure.  Discussed briefly with neurology.  Agrees with initiating Keppra.  She was loaded with a gram of Keppra and will be started on 500 mg twice daily.  Mother had some concerns about minor trauma at home.  CT scan does not show any evidence of traumatic injury.  Labs are largely reassuring.  Patient and mother were advised that she should not drive.  Mother states that she is aware and she has not been driving since December.  They will follow-up with Dr. Tomi Likens.  (Labs, imaging, consults)  Labs: I Ordered, and personally interpreted labs.  The pertinent results include: CBC, BMP, UDS, beta-hCG  Imaging Studies ordered: I ordered imaging studies including CT head I independently visualized and interpreted imaging. I agree with the radiologist interpretation  Additional history obtained from EMS and mother.  External records from outside source obtained and reviewed including admission for seizures, EEGs  Cardiac Monitoring: The patient was maintained on a cardiac monitor.  If on the cardiac monitor, I personally viewed and interpreted the cardiac monitored which showed an underlying rhythm of: Sinus  rhythm  Reevaluation: After the interventions noted above, I reevaluated the patient and found that they have :improved  Social Determinants of Health:  lives independently  Disposition: Discharge  Co morbidities that complicate the patient evaluation  Past Medical History:  Diagnosis Date   Acne cystica 02/10/2015   Asthma    Delayed gastric emptying 01/08/2020   Related to progesterone effect of pregnancy  Rx Reglan '5mg'$  q8h prn   Dysmenorrhea 06/24/2013   Migraines    Polycystic ovarian syndrome    Premenstrual syndrome 05/01/2021   Sickle cell trait ([place])      Medicines Meds ordered this encounter  Medications   levETIRAcetam (KEPPRA) IVPB 1000 mg/100 mL premix   levETIRAcetam (KEPPRA) 500 MG tablet    Sig: Take 1 tablet (500 mg total) by mouth 2 (two) times daily.    Dispense:  60 tablet    Refill:  1    I have reviewed the patients home medicines and have made adjustments as needed  Problem List / ED Course: Problem List Items Addressed This Visit   None Visit Diagnoses     Seizure ([place])    -  Primary   Relevant Medications   levETIRAcetam (KEPPRA) IVPB 1000 mg/100 mL premix (Completed)   levETIRAcetam (KEPPRA) 500 MG tablet                   Final Clinical Impression(s) / ED Diagnoses Final diagnoses:  Seizure ([place])    Rx / DC Orders ED Discharge Orders          Ordered    levETIRAcetam (KEPPRA) 500 MG tablet  2 times daily        09/17/22 0457              Jakara Blatter, Barbette Hair, MD 09/17/22 0501

## 2022-09-17 NOTE — ED Triage Notes (Signed)
Pt arrived from home via [facility] w c/o sz, tonic clonic x 1 min. Pt had first 2 sz in December. Pt is not on any medication for sz.
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
## 2022-09-21 ENCOUNTER — Encounter: Payer: Self-pay | Admitting: Neurology

## 2022-09-21 ENCOUNTER — Ambulatory Visit: Payer: Medicaid Other | Admitting: Neurology

## 2022-09-21 VITALS — BP 129/79 | HR 111 | Ht 59.0 in | Wt 183.2 lb

## 2022-09-21 DIAGNOSIS — G40309 Generalized idiopathic epilepsy and epileptic syndromes, not intractable, without status epilepticus: Secondary | ICD-10-CM | POA: Diagnosis not present

## 2022-09-21 NOTE — Patient Instructions (Addendum)
1. Continue levetiracetam '500mg'$  twice daily, take it exactly as directed.  Do not stop taking the medicine without talking to your doctor first, even if you have not had a seizure in a long time.   2. Avoid activities in which a seizure would cause danger to yourself or to others.  Don't operate dangerous machinery, swim alone, or climb in high or dangerous places, such as on ladders, roofs, or girders.  Do not drive unless your doctor says you may.  3. If you have any warning that you may have a seizure, lay down in a safe place where you can't hurt yourself.    4.  No driving for 6 months from last seizure, as per [facility].   Please refer to the following link on the [place] website for more information: http://www.epilepsyfoundation.org/answerplace/Social/driving/drivingu.cfm   5.  Maintain good sleep hygiene.  6.  Notify your neurology if you are planning pregnancy or if you become pregnant.  7.  Contact your doctor if you have any problems that may be related to the medicine you are taking.  8.  Call 911 and bring the patient back to the ED if:        A.  The seizure lasts longer than 5 minutes.       B.  The patient doesn't awaken shortly after the seizure  C.  The patient has new problems such as difficulty seeing, speaking or moving  D.  The patient was injured during the seizure  E.  The patient has a temperature over 102 F (39C)  F.  The patient vomited and now is having trouble breathing

## 2022-09-22 ENCOUNTER — Encounter: Payer: Self-pay | Admitting: Neurology

## 2022-09-27 ENCOUNTER — Ambulatory Visit (INDEPENDENT_AMBULATORY_CARE_PROVIDER_SITE_OTHER): Payer: Medicaid Other | Admitting: Licensed Clinical Social Worker

## 2022-09-27 DIAGNOSIS — F339 Major depressive disorder, recurrent, unspecified: Secondary | ICD-10-CM

## 2022-09-27 NOTE — BH Specialist Note (Signed)
Integrated Behavioral Health Follow Up In-Person Visit  MRN: [id number] Name: Bailey Rose  Number of [facility] Clinician visits: 4- Fourth Visit  Session Start time: [id number]   Session End time: [id number]  Total time in minutes: 68   Types of Service: Individual psychotherapy and General Behavioral Integrated Care (BHI)  Interpretor:No. Interpretor Name and Language: N/A  Subjective: Bailey Rose is a 27 y.o. female  Patient was referred by PCP for Depression and Anxiety.   Objective: Mood:  Appropriate   and Affect: Appropriate Risk of harm to self or others: No plan to harm self or others  Patient and/or Family's Strengths/Protective Factors: Social and Emotional competence  Goals Addressed: Patient will:  Reduce symptoms of: depression   Increase knowledge and/or ability of: coping skills   Demonstrate ability to: Increase adequate support systems for patient/family  Progress towards Goals: Ongoing  Interventions: Interventions utilized:  Motivational Interviewing, Mindfulness or Relaxation Training, and Supportive Counseling Standardized Assessments completed: PHQ-SADS     07/27/2022   12:01 PM 07/27/2022   11:51 AM 09/12/2021   11:17 AM  PHQ-SADS Last 3 Score only  Total GAD-7 Score 9    PHQ Adolescent Score  12 0     Assessment: Patient has a recent seizure and placed on medication. Patient state family is triggering her seizures. Patient stated she needed a self care trip. Patient discussed [place] and [place]. [facility] discussed budget and safety.   Patient discussed smoking addiction. Patient denied wanting medication or patches for smoking addiction. Patient stated she has went from 7 cigarettes to 1 a day.  Patient interested in supports groups and wanting information on all types of groups. [facility] recommends [employer].ee  [facility] gave patient information for mental health resources ( 211, 3025141394, and [employer] center). [facility] also discussed patient calling 911 If needed.  Patient will make efforts to going to gym, walking, or some form of self care.   Patient may benefit from ongoing counseling.  Plan: Follow up with behavioral health clinician on : 04/10  Milus Height, MSW, [place]  Fax [phone] Main Office Phone: [phone] [place], [place] [address] Website: [place], [place]

## 2022-10-25 ENCOUNTER — Ambulatory Visit (INDEPENDENT_AMBULATORY_CARE_PROVIDER_SITE_OTHER): Payer: Medicaid Other | Admitting: Licensed Clinical Social Worker

## 2022-10-25 DIAGNOSIS — F339 Major depressive disorder, recurrent, unspecified: Secondary | ICD-10-CM

## 2022-10-25 NOTE — BH Specialist Note (Signed)
Integrated Behavioral Health Follow Up In-Person Visit  MRN: [id number] Name: Bailey Rose  Number of Integrated Behavioral Health Clinician visits: 3- Third Visit  Session Start time: 1300   Session End time: 1400  Total time in minutes: 60   TTypes of Service: Individual psychotherapy and Health & Behavioral Assessment/Intervention         Warm Hand Off Completed.             Subjective: Bailey Rose is a 27 y.o. female accompanied by  self Patient was referred by PCP for Depression and Anxiety. Patient reports the following symptoms/concerns: Overwhelmed, addiction to smoking  Duration of problem: years; Severity of problem: severe   Objective: Mood: Anxious and Affect: Tearful Risk of harm to self or others: No plan to harm self or others     Goals Addressed: Patient will: Reduce symptoms of: depression     Progress towards Goals: Ongoing   Interventions: Interventions utilized: Motivational Interviewing and Supportive Counseling  Standardized Assessments completed: PHQ-SADS       07/27/2022   12:01 PM 07/27/2022   11:51 AM 09/12/2021   11:17 AM  PHQ-SADS Last 3 Score only  Total GAD-7 Score 9      PHQ Adolescent Score   12 0      Assessment: Patient is working on enrolling in school.  Patient practice self care and did yoga.  Patient has an upcoming vacation to [facility] July 7th-4th Patient continues to work on smoking at night as a coping mechanism to calm her anxiety. [facility] educated patient on addiction, support groups and importance of stopping,      Patient stated her goal is to live on her own. [facility] and patient discussed reasons and plans to do so.Patient stated she wanted to mind her business and not be so friendly. Patient aspires to make better choices.    Patient expressed a joy for going to the gym. [facility] and Patient agreed patient should visit th gym weekly. Patient expressed enjoying journaling. Patient agreed to restart journaling.    Patient may benefit from Motivational Interviewing and Supportive Counseling.   Plan: Follow up with behavioral health clinician on : See [facility] within the next 30 days.     Christen Butter, MSW, LCSW-A She/Her Behavioral Health Clinician [facility]  Internal Medicine Center Direct Dial:[phone]  Fax [phone] Main Office Phone: [phone] [address] [place] [place], [place] [address] Website: [facility] Internal Medicine [facility]  [place], [place]  [place]

## 2022-11-01 ENCOUNTER — Telehealth: Payer: Self-pay

## 2022-11-01 NOTE — Telephone Encounter (Signed)
Patients mom called in wanting to let us know that the patient had a seizure this morning, it lasted about 2-3 minutes. Mom is asking for a call back.

## 2022-11-01 NOTE — Telephone Encounter (Signed)
LMOVM for patient and mother,  She needs to take her medication as directed, otherwise she is at increased risk for seizures. If her concern is side effects from levetiracetam, then we can transition to another medication.  Lamotrigine is another medication that is well tolerated, however we need to titrate dose slowly up over 4-5 weeks.  During that time, she will need to remain on levetiracetam.  If she is agreeable to the change, I will send in a prescription.  Also, as she has had another seizure, she will not be able to drive for another 6 months from today (as per [employer]).

## 2022-11-01 NOTE — Telephone Encounter (Signed)
Pt c/o: seizure Missed medications?  Yes.   Sleep deprived?  No. Alcohol intake?  No. Increased stress? Yes.   Any change in medication color or shape? No. Back to their usual baseline self?  No.. If no, advise go to ER Current medications prescribed by Dr. Karel Jarvis: Keppra: not taking as directed.  Per patient she feels weird taking the morning dose so she stopped taking it and now taking only at night. Has not taken her dose of Keppra today.    Per patient okay to speak to her mother. Patient did not want to talk.

## 2022-11-02 NOTE — Telephone Encounter (Signed)
Advised patient mother of Dr.Jaffe note./;  Per patient mother patient did take the Keppra BID yesterday. They will see how she do and let us know if she wants to transition.

## 2022-11-07 ENCOUNTER — Telehealth: Payer: Self-pay

## 2022-11-07 NOTE — Telephone Encounter (Signed)
Tried calling patient twice since speaking to the AN this morning.  No answer, LMOVM both times to please give the office a call back.

## 2022-11-07 NOTE — Telephone Encounter (Signed)
Pt called in and left a message. She was returning Sheena's call. 

## 2022-11-08 NOTE — Telephone Encounter (Signed)
LMOVM for patient to give [place] a call back or send a mychart message to let us know what going on.

## 2022-11-23 ENCOUNTER — Ambulatory Visit (INDEPENDENT_AMBULATORY_CARE_PROVIDER_SITE_OTHER): Payer: Medicaid Other | Admitting: Licensed Clinical Social Worker

## 2022-11-23 ENCOUNTER — Telehealth: Payer: Self-pay | Admitting: Anesthesiology

## 2022-11-23 DIAGNOSIS — F339 Major depressive disorder, recurrent, unspecified: Secondary | ICD-10-CM

## 2022-11-23 NOTE — Progress Notes (Signed)
Patient no-showed today's appointment; appointment was for Telephone visit at 2:00 pm   Patient will need to reschedule appointment by calling Internal medicine center [phone].  Christen Butter, MSW, LCSW-A She/Her Behavioral Health Clinician [facility]  Internal Medicine Center Direct Dial:[phone]  Fax [phone] Main Office Phone: [phone] [address] [place] [place], [place] [address] Website: [facility] Internal Medicine [facility]  [place], [place]  [place]

## 2022-11-23 NOTE — Telephone Encounter (Signed)
Pt called stating she has not been able to fill her Keppra Rx. States she only has medication left for today only. Requests call back.

## 2022-11-24 ENCOUNTER — Other Ambulatory Visit: Payer: Self-pay

## 2022-11-24 ENCOUNTER — Encounter: Payer: Self-pay | Admitting: Neurology

## 2022-11-24 ENCOUNTER — Telehealth: Payer: Self-pay | Admitting: Neurology

## 2022-11-24 NOTE — Telephone Encounter (Signed)
LMOVM patient per last note with patient mother, Patient to call to let us know how she is doing going back  on twice day.   Advised patient mother of Dr.Jaffe note./;   Per patient mother patient did take the Keppra BID yesterday. They will see how she do and let us know if she wants to transition.

## 2022-11-24 NOTE — Telephone Encounter (Signed)
Sent and called patient to verify

## 2022-11-24 NOTE — Telephone Encounter (Signed)
Patient would like a call back asap. She needs a refill on her keppra.

## 2022-11-27 ENCOUNTER — Telehealth: Payer: Self-pay | Admitting: Neurology

## 2022-11-27 ENCOUNTER — Other Ambulatory Visit: Payer: Self-pay

## 2022-11-27 MED ORDER — LEVETIRACETAM 500 MG PO TABS: 500.0000 mg | ORAL_TABLET | Freq: Two times a day (BID) | ORAL | 1 refills | Status: DC

## 2022-11-27 NOTE — Telephone Encounter (Signed)
Patient states she having trouble going to sleep.  Please advise.

## 2022-11-27 NOTE — BH Specialist Note (Signed)
Patient no-showed today's appointment; appointment was for in-person.  Patient will need to reschedule appointment by calling Internal medicine center [phone].  Christen Butter, MSW, LCSW-A She/Her Behavioral Health Clinician [facility]  Internal Medicine Center Direct Dial:[phone]  Fax [phone] Main Office Phone: [phone] [address] [place] [place], [place] [address] Website: [facility] Internal Medicine [facility]  [place], [place]  [place]

## 2022-11-27 NOTE — Telephone Encounter (Signed)
Pt called in and left a message with the access nurse on 11/24/22. Caller stated she needed a refill on an RX. She is having difficulty sleeping and would like to know what she should be doing?

## 2022-11-27 NOTE — Telephone Encounter (Signed)
Patient advised of Dr.Jaffe notes, She should follow up with her PCP.   Per patient since no one can tell me where my Seizures are coming from. "I researched I thing It could be [facility]."

## 2022-11-27 NOTE — Telephone Encounter (Signed)
Spoke to patient and Pharmacy, Script sent and received.

## 2022-11-28 NOTE — Telephone Encounter (Signed)
Patient advised of Dr.Jaffe note,  We already did an ambulatory EEG and didn't capture anything.  At this point, standard of care is to go ahead and treat it as seizures.  We cannot always capture every seizure.

## 2022-11-28 NOTE — Telephone Encounter (Signed)
Patient advised of Dr.Jaffe note, That is a possibility but the only way to definitely make that diagnosis is the capture one of her symptoms on EEG    Patient wanted to know how she will go about getting another EEG?
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
## 2022-12-18 ENCOUNTER — Ambulatory Visit: Payer: Medicaid Other | Admitting: Neurology
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
## 2022-12-26 ENCOUNTER — Encounter: Payer: Self-pay | Admitting: Neurology

## 2022-12-26 ENCOUNTER — Ambulatory Visit: Payer: Medicaid Other | Admitting: Neurology
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
## 2023-01-02 ENCOUNTER — Ambulatory Visit: Payer: Medicaid Other | Admitting: Neurology

## 2023-01-02 ENCOUNTER — Encounter: Payer: Self-pay | Admitting: Neurology

## 2023-01-02 VITALS — BP 111/77 | HR 96 | Ht 59.0 in | Wt 171.0 lb

## 2023-01-02 DIAGNOSIS — Z87820 Personal history of traumatic brain injury: Secondary | ICD-10-CM | POA: Diagnosis not present

## 2023-01-02 DIAGNOSIS — G40309 Generalized idiopathic epilepsy and epileptic syndromes, not intractable, without status epilepticus: Secondary | ICD-10-CM

## 2023-01-02 NOTE — Patient Instructions (Addendum)
1. Continue levetiracetam 500mg  daily for now.  Consider changing to lamotrigine (see below).  2. If you have any warning that you may have a seizure, lay down in a safe place where you can't hurt yourself.    3.  No driving for 6 months from last seizure, as per [facility].   Please refer to the following link on the Epilepsy Foundation of America's website for more information: http://www.epilepsyfoundation.org/answerplace/Social/driving/drivingu.cfm   4.  Maintain good sleep hygiene.  5.  Notify your neurology if you are planning pregnancy or if you become pregnant.  6.  Contact your doctor if you have any problems that may be related to the medicine you are taking.  7.  Call 911 and bring the patient back to the ED if:        A.  The seizure lasts longer than 5 minutes.       B.  The patient doesn't awaken shortly after the seizure  C.  The patient has new problems such as difficulty seeing, speaking or moving  D.  The patient was injured during the seizure  E.  The patient has a temperature over 102 F (39C)  F.  The patient vomited and now is having trouble breathing    Lamotrigine  What is this medication? LAMOTRIGINE (la MOE Patrecia Pace) prevents and controls seizures in people with epilepsy. It may also be used to treat bipolar disorder. It works by calming overactive nerves in your body. This medicine may be used for other purposes; ask your health care provider or pharmacist if you have questions. COMMON BRAND NAME(S): Lamictal, Subvenite What should I tell my care team before I take this medication? They need to know if you have any of these conditions: Heart disease History of irregular heartbeat Immune system problems Kidney disease Liver disease Low levels of folic acid in the blood Lupus Mental health condition Suicidal thoughts, plans, or attempt by you or a family member An unusual or allergic reaction to lamotrigine, other medications, foods, dyes,  or preservatives Pregnant or trying to get pregnant Breastfeeding How should I use this medication? Take this medication by mouth with a glass of water. Follow the directions on the prescription label. Do not chew these tablets. If this medication upsets your stomach, take it with food or milk. Take your doses at regular intervals. Do not take your medication more often than directed. A special MedGuide will be given to you by the pharmacist with each new prescription and refill. Be sure to read this information carefully each time. Talk to your care team about the use of this medication in children. While this medication may be prescribed for children as young as 2 years for selected conditions, precautions do apply. Overdosage: If you think you have taken too much of this medicine contact a poison control center or emergency room at once. NOTE: This medicine is only for you. Do not share this medicine with others. What if I miss a dose? If you miss a dose, take it as soon as you can. If it is almost time for your next dose, take only that dose. Do not take double or extra doses. What may interact with this medication? Atazanavir Certain medications for irregular heartbeat Certain medications for seizures, such as carbamazepine, phenobarbital, phenytoin, primidone, or valproic acid Estrogen or progestin hormones Lopinavir Rifampin Ritonavir This list may not describe all possible interactions. Give your health care provider a list of all the medicines, herbs, non-prescription  drugs, or dietary supplements you use. Also tell them if you smoke, drink alcohol, or use illegal drugs. Some items may interact with your medicine. What should I watch for while using this medication? Visit your care team for regular checks on your progress. If you take this medication for seizures, wear a Medic Alert bracelet or necklace. Carry an identification card with information about your condition, medications,  and care team. It is important to take this medication exactly as directed. When first starting treatment, your dose will need to be adjusted slowly. It may take weeks or months before your dose is stable. You should contact your care team if your seizures get worse or if you have any new types of seizures. Do not stop taking this medication unless instructed by your care team. Stopping your medication suddenly can increase your seizures or their severity. This medication may cause serious skin reactions. They can happen weeks to months after starting the medication. Contact your care team right away if you notice fevers or flu-like symptoms with a rash. The rash may be red or purple and then turn into blisters or peeling of the skin. You may also notice a red rash with swelling of the face, lips, or lymph nodes in your neck or under your arms. This medication may affect your coordination, reaction time, or judgment. Do not drive or operate machinery until you know how this medication affects you. Sit up or stand slowly to reduce the risk of dizzy or fainting spells. Drinking alcohol with this medication can increase the risk of these side effects. If you are taking this medication for bipolar disorder, it is important to report any changes in your mood to your care team. If your condition gets worse, you get mentally depressed, feel very hyperactive or manic, have difficulty sleeping, or have thoughts of hurting yourself or committing suicide, you need to get help from your care team right away. If you are a caregiver for someone taking this medication for bipolar disorder, you should also report these behavioral changes right away. The use of this medication may increase the chance of suicidal thoughts or actions. Pay special attention to how you are responding while on this medication. Your mouth may get dry. Chewing sugarless gum or sucking hard candy and drinking plenty of water may help. Contact your care  team if the problem does not go away or is severe. If you become pregnant while using this medication, you may enroll in the [place] American Antiepileptic Drug Pregnancy Registry by calling [phone]. This registry collects information about the safety of antiepileptic medication use during pregnancy. This medication may cause a decrease in folic acid. You should make sure that you get enough folic acid while you are taking this medication. Discuss the foods you eat and the vitamins you take with your care team. What side effects may I notice from receiving this medication? Side effects that you should report to your care team as soon as possible: Allergic reactions--skin rash, itching, hives, swelling of the face, lips, tongue, or throat Change in vision Fever, neck pain or stiffness, sensitivity to light, headache, nausea, vomiting, confusion Heart rhythm changes--fast or irregular heartbeat, dizziness, feeling faint or lightheaded, chest pain, trouble breathing Infection--fever, chills, cough, or sore throat Liver injury--right upper belly pain, loss of appetite, nausea, light-colored stool, dark yellow or brown urine, yellowing skin or eyes, unusual weakness or fatigue Low red blood cell count--unusual weakness or fatigue, dizziness, headache, trouble breathing Rash, fever, and  swollen lymph nodes Redness, blistering, peeling or loosening of the skin, including inside the mouth Thoughts of suicide or self-harm, worsening mood, or feelings of depression Unusual bruising or bleeding Side effects that usually do not require medical attention (report to your care team if they continue or are bothersome): Diarrhea Dizziness Drowsiness Headache Nausea Stomach pain Tremors or shaking This list may not describe all possible side effects. Call your doctor for medical advice about side effects. You may report side effects to FDA at 1-800-FDA-1088. Where should I keep my medication? Keep out  of the reach of children and pets. Store at [employer] [address] (77 degrees F). Protect from light. Get rid of any unused medication after the expiration date. To get rid of medications that are no longer needed or have expired: Take the medication to a medication take-back program. Check with your pharmacy or law enforcement to find a location. If you cannot return the medication, check the label or package insert to see if the medication should be thrown out in the garbage or flushed down the toilet. If you are not sure, ask your care team. If it is safe to put it in the trash, empty the medication out of the container. Mix the medication with cat litter, dirt, coffee grounds, or other unwanted substance. Seal the mixture in a bag or container. Put it in the trash. NOTE: This sheet is a summary. It may not cover all possible information. If you have questions about this medicine, talk to your doctor, pharmacist, or health care provider.  2024 Elsevier/Gold Standard (2022-01-10 00:00:00)

## 2023-01-24 ENCOUNTER — Other Ambulatory Visit: Payer: Self-pay | Admitting: Neurology

## 2023-01-26 ENCOUNTER — Ambulatory Visit: Payer: Medicaid Other

## 2023-01-29 ENCOUNTER — Encounter: Payer: Self-pay | Admitting: Neurology

## 2023-01-29 IMAGING — US US PELVIS COMPLETE WITH TRANSVAGINAL
1 series · 13 of 25 positions shown · non-contrast
Comparison: Obstetrical ultrasound 12/19/2019

CLINICAL DATA: Pelvic pain, LMP 01/30/2021

EXAM:
TRANSABDOMINAL AND TRANSVAGINAL ULTRASOUND OF PELVIS
TECHNIQUE: Both transabdominal and transvaginal ultrasound examinations of the
pelvis were performed. Transabdominal technique was performed for
global imaging of the pelvis including uterus, ovaries, adnexal
regions, and pelvic cul-de-sac. It was necessary to proceed with
endovaginal exam following the transabdominal exam to visualize the
endometrium, LEFT ovary, and to characterize a cystic lesion in the
RIGHT adnexa.

[Series 1: us pelvic complete with transvaginal · 13 of 81 slices shown]
[im 1/81]
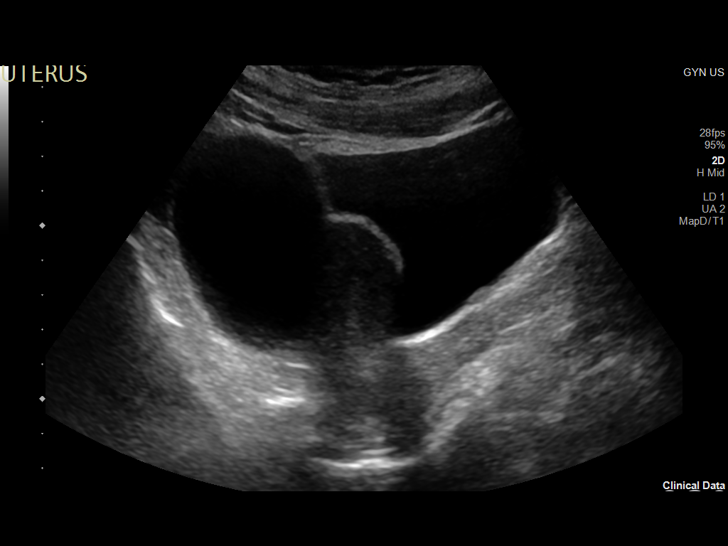
[im 7/81]
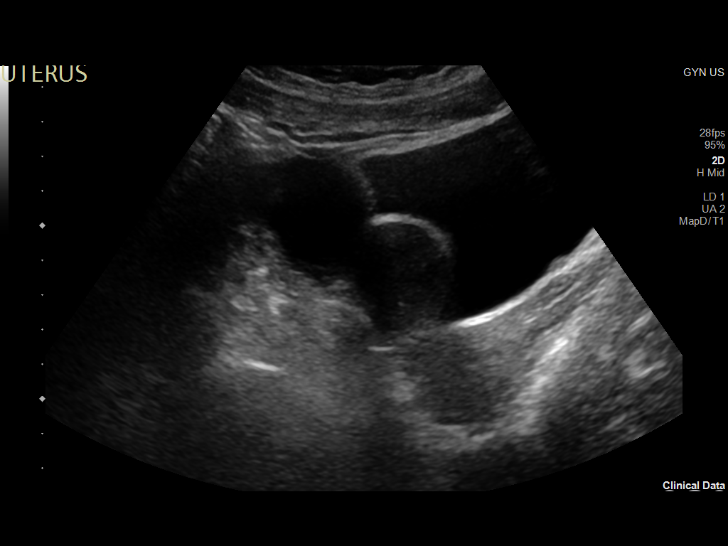
[im 14/81]
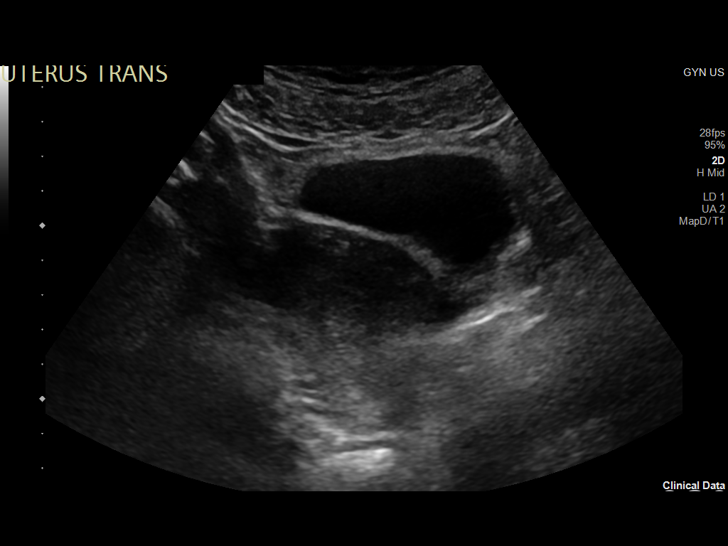
[im 21/81]
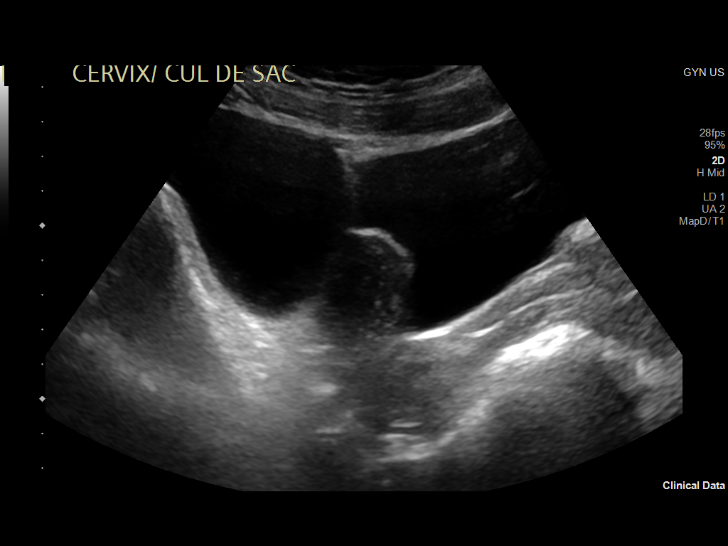
[im 27/81]
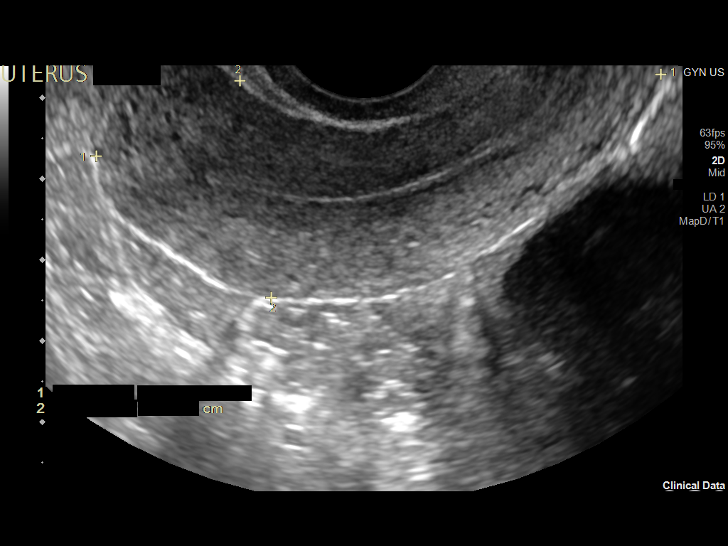
[im 34/81]
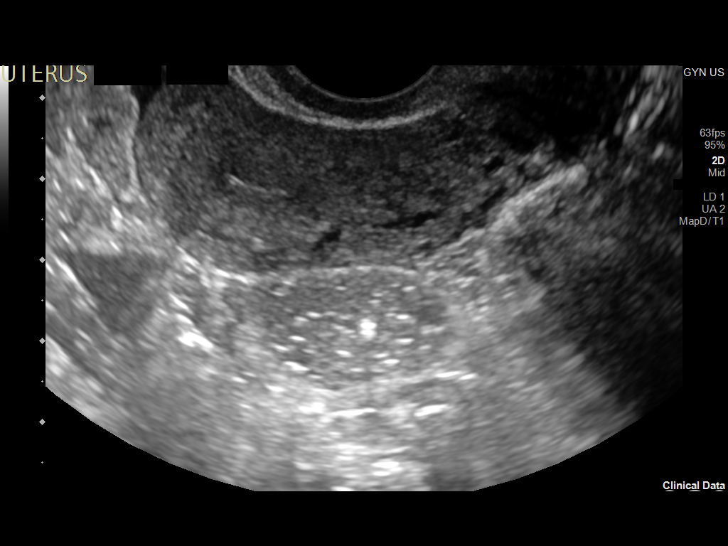
[im 41/81]
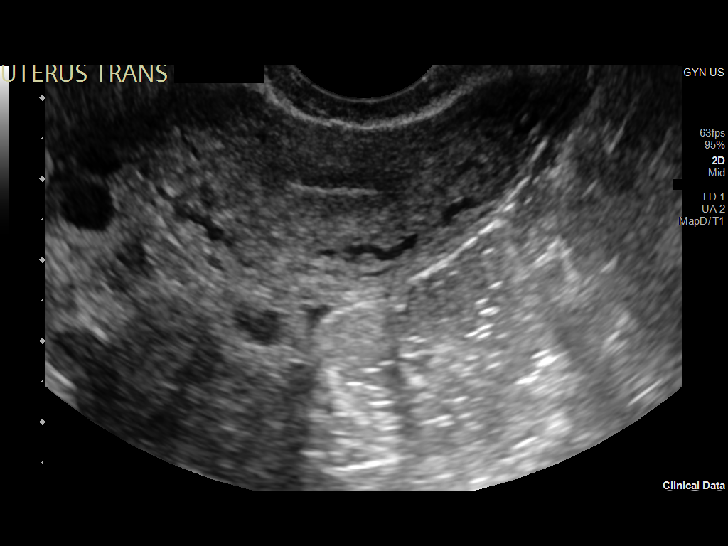
[im 47/81]
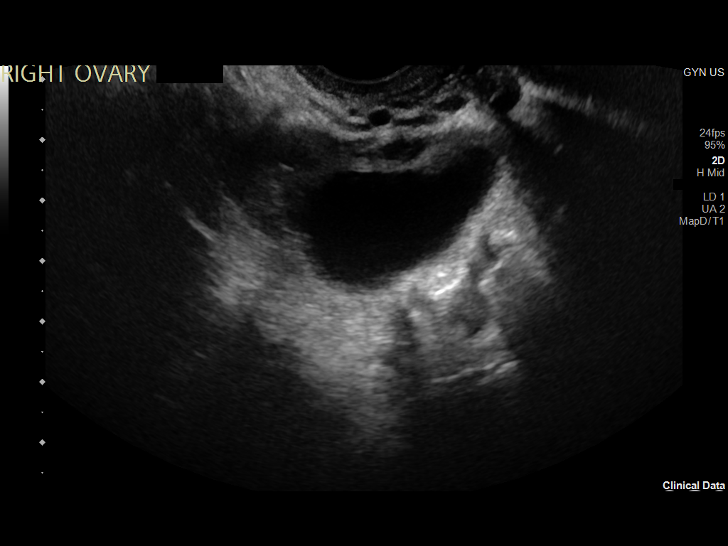
[im 54/81]
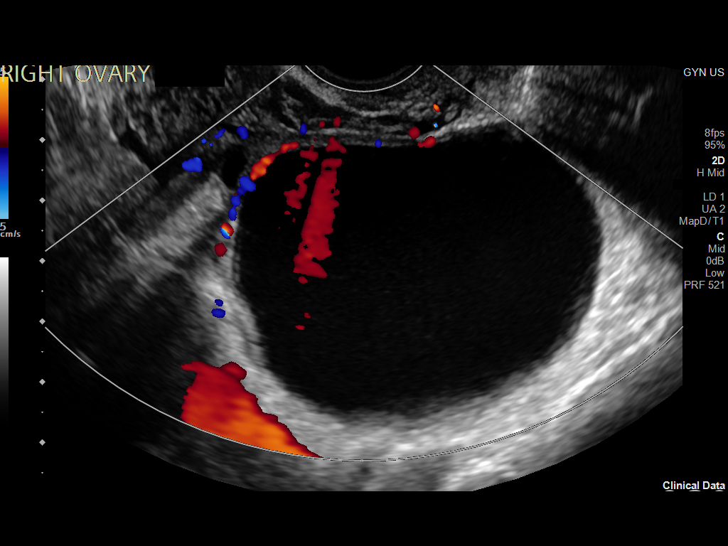
[im 61/81]
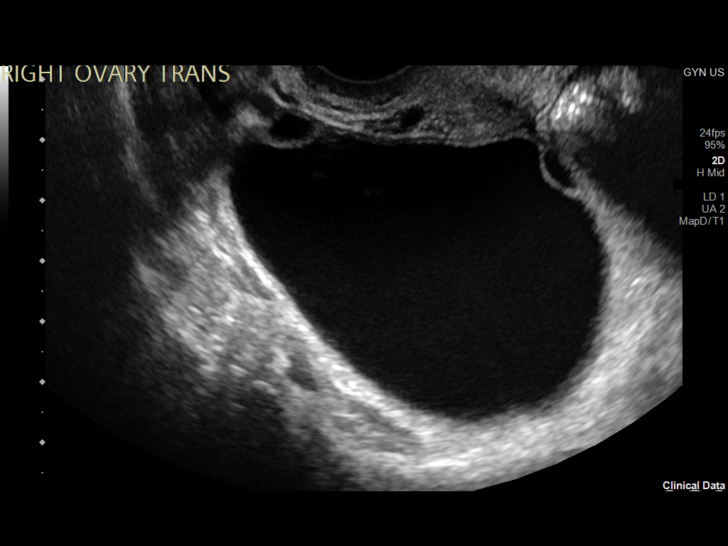
[im 67/81]
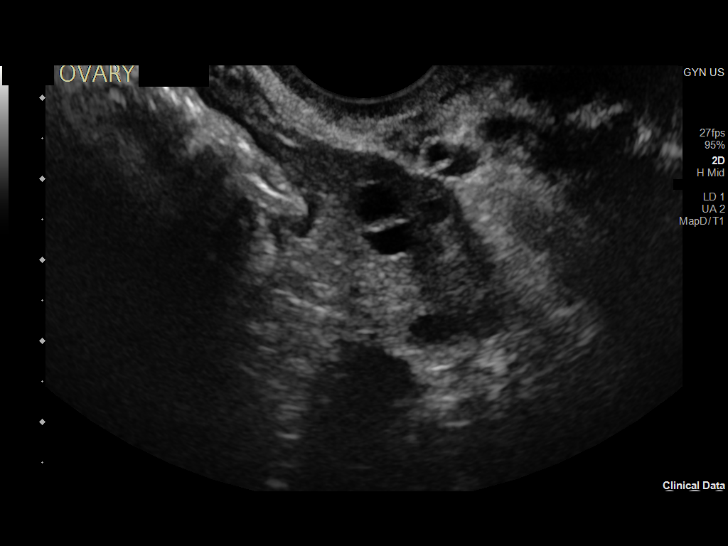
[im 74/81]
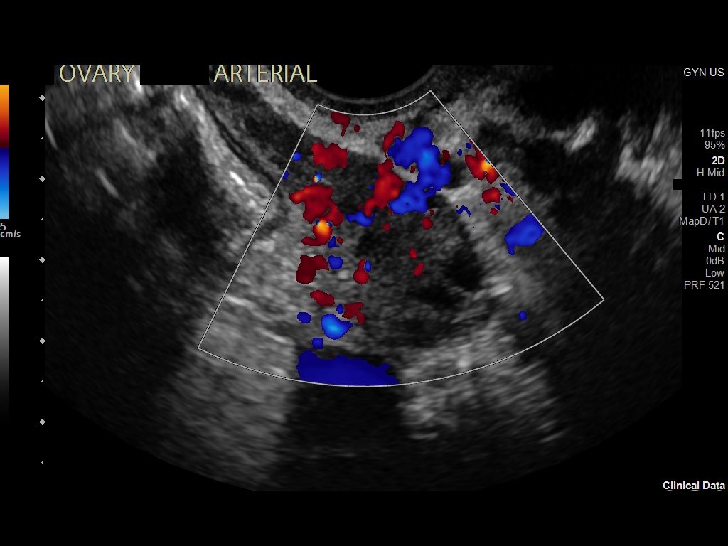
[im 81/81]
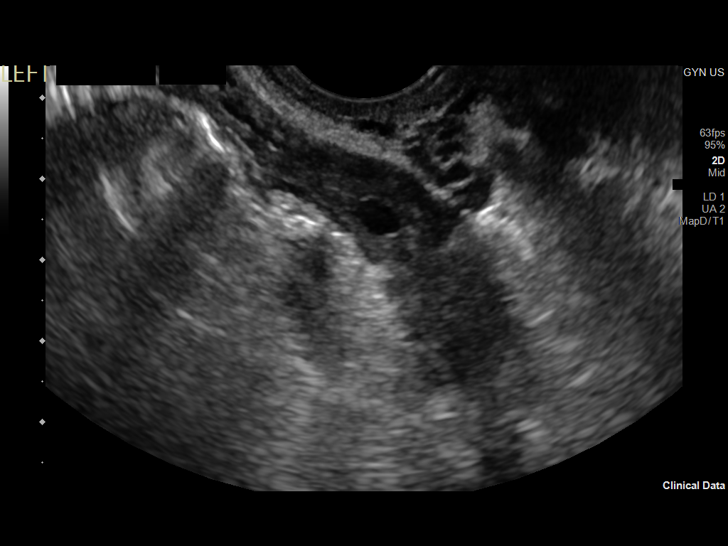

[13 of 25 positions shown; findings below may reference images not displayed]

FINDINGS: Uterus

Measurements: 6.8 x 3.2 x 4.9 cm = volume: 56 mL. Anteverted. Normal
morphology without mass

Endometrium

Thickness: 2 mm.  No endometrial fluid or focal abnormalities

Right ovary

Measurements: 5.4 x 2.7 x 2.0 cm = volume: 14.9 mL. Cystic structure
identified adjacent to RIGHT ovary, 6.0 x 3.7 x 5.8 cm, containing
several tiny internal peripheral daughter cysts, question cumulus
oophorus or less likely mildly complicated cyst. No mural
nodularity.

Left ovary

Measurements: 2.7 x 1.3 x 1.7 cm = volume: 3.1 mL. Normal morphology
without mass

Other findings

No free pelvic fluid.  No additional adnexal masses.
IMPRESSION: Unremarkable uterus, endometrial complex and LEFT ovary.

6.0 cm diameter minimally complicated cyst with several internal
daughter cysts/peripheral septations, potentially representing a
cumulus oophorus; Recommend follow-up US in 3-6 months. Note: This
recommendation does not apply to premenarchal patients or to those
with increased risk (genetic, family history, elevated tumor markers
or other high-risk factors) of ovarian cancer. Reference: Radiology
[DATE]):[id number].

## 2023-02-01 ENCOUNTER — Encounter (HOSPITAL_COMMUNITY): Payer: Self-pay | Admitting: *Deleted

## 2023-02-01 ENCOUNTER — Other Ambulatory Visit: Payer: Self-pay

## 2023-02-01 ENCOUNTER — Emergency Department (HOSPITAL_COMMUNITY)
Admission: EM | Admit: 2023-02-01 | Discharge: 2023-02-01 | Disposition: A | Discharge status: Home / Self Care | Payer: Medicaid Other | Attending: Emergency Medicine | Admitting: Emergency Medicine

## 2023-02-01 DIAGNOSIS — Z91040 Latex allergy status: Secondary | ICD-10-CM | POA: Insufficient documentation

## 2023-02-01 DIAGNOSIS — D72829 Elevated white blood cell count, unspecified: Secondary | ICD-10-CM | POA: Diagnosis not present

## 2023-02-01 DIAGNOSIS — R569 Unspecified convulsions: Secondary | ICD-10-CM | POA: Insufficient documentation

## 2023-02-01 DIAGNOSIS — R112 Nausea with vomiting, unspecified: Secondary | ICD-10-CM | POA: Diagnosis not present

## 2023-02-01 DIAGNOSIS — R9431 Abnormal electrocardiogram [ECG] [EKG]: Secondary | ICD-10-CM | POA: Diagnosis not present

## 2023-02-01 LAB — CBC WITH DIFFERENTIAL/PLATELET
Abs Immature Granulocytes: 0.14 10*3/uL — ABNORMAL HIGH (ref 0.00–0.07)
Basophils Absolute: 0.1 10*3/uL (ref 0.0–0.1)
Basophils Relative: 0 %
Eosinophils Absolute: 0 10*3/uL (ref 0.0–0.5)
Eosinophils Relative: 0 %
HCT: 42.3 % (ref 36.0–46.0)
Hemoglobin: 14 g/dL (ref 12.0–15.0)
Immature Granulocytes: 1 %
Lymphocytes Relative: 8 %
Lymphs Abs: 1.4 10*3/uL (ref 0.7–4.0)
MCH: 25 pg — ABNORMAL LOW (ref 26.0–34.0)
MCHC: 33.1 g/dL (ref 30.0–36.0)
MCV: 75.4 fL — ABNORMAL LOW (ref 80.0–100.0)
Monocytes Absolute: 0.7 10*3/uL (ref 0.1–1.0)
Monocytes Relative: 4 %
Neutro Abs: 14.7 10*3/uL — ABNORMAL HIGH (ref 1.7–7.7)
Neutrophils Relative %: 87 %
Platelets: 239 10*3/uL (ref 150–400)
RBC: 5.61 MIL/uL — ABNORMAL HIGH (ref 3.87–5.11)
RDW: 13.2 % (ref 11.5–15.5)
WBC: 17.1 10*3/uL — ABNORMAL HIGH (ref 4.0–10.5)
nRBC: 0 % (ref 0.0–0.2)

## 2023-02-01 LAB — COMPREHENSIVE METABOLIC PANEL
ALT: 10 U/L (ref 0–44)
AST: 13 U/L — ABNORMAL LOW (ref 15–41)
Albumin: 4 g/dL (ref 3.5–5.0)
Alkaline Phosphatase: 56 U/L (ref 38–126)
Anion gap: 7 (ref 5–15)
BUN: 8 mg/dL (ref 6–20)
CO2: 23 mmol/L (ref 22–32)
Calcium: 9.1 mg/dL (ref 8.9–10.3)
Chloride: 106 mmol/L (ref 98–111)
Creatinine, Ser: 0.88 mg/dL (ref 0.44–1.00)
GFR, Estimated: >60 mL/min (ref >60)
Glucose, Bld: 107 mg/dL — ABNORMAL HIGH (ref 70–99)
Potassium: 3.6 mmol/L (ref 3.5–5.1)
Sodium: 136 mmol/L (ref 135–145)
Total Bilirubin: 0.7 mg/dL (ref 0.3–1.2)
Total Protein: 7.1 g/dL (ref 6.5–8.1)

## 2023-02-01 LAB — HCG, SERUM, QUALITATIVE: Preg, Serum: NEGATIVE

## 2023-02-01 LAB — LIPASE, BLOOD: Lipase: 23 U/L (ref 11–51)

## 2023-02-01 MED ORDER — ONDANSETRON HCL 4 MG/2ML IJ SOLN
4.0000 mg | Freq: Once | INTRAMUSCULAR | Status: AC
  Administered 2023-02-01: 4 mg via INTRAVENOUS
  Filled 2023-02-01: qty 2

## 2023-02-01 MED ORDER — ONDANSETRON 4 MG PO TBDP: ORAL_TABLET | ORAL | 0 refills | Status: DC

## 2023-02-01 MED ORDER — METOCLOPRAMIDE HCL 5 MG/ML IJ SOLN
10.0000 mg | Freq: Once | INTRAMUSCULAR | Status: AC
  Administered 2023-02-01: 10 mg via INTRAVENOUS
  Filled 2023-02-01: qty 2

## 2023-02-01 MED ORDER — LEVETIRACETAM IN NACL 1000 MG/100ML IV SOLN
1000.0000 mg | Freq: Once | INTRAVENOUS | Status: AC
  Administered 2023-02-01: 1000 mg via INTRAVENOUS
  Filled 2023-02-01: qty 100

## 2023-02-01 MED ORDER — DIAZEPAM 5 MG/ML IJ SOLN
2.5000 mg | Freq: Once | INTRAMUSCULAR | Status: AC
  Administered 2023-02-01: 2.5 mg via INTRAVENOUS
  Filled 2023-02-01: qty 2

## 2023-02-01 MED ORDER — SODIUM CHLORIDE 0.9 % IV BOLUS
1000.0000 mL | Freq: Once | INTRAVENOUS | Status: AC
  Administered 2023-02-01: 1000 mL via INTRAVENOUS

## 2023-02-01 NOTE — ED Provider Notes (Signed)
[place] EMERGENCY DEPARTMENT AT [facility] Provider Note   CSN: [id number] Arrival date & time: 02/01/23  0138     History  Chief Complaint  Patient presents with   Seizures    Bailey Rose is a 27 y.o. female.  Patient presents with concerns over seizure.  Patient with seizure disorder, had 2 seizures today.  Patient has had problems with nausea, vomiting for the last 24 hours and has not been able to take her Keppra since Tuesday.       Home Medications Prior to Admission medications   Medication Sig Start Date End Date Taking? Authorizing Provider  ondansetron (ZOFRAN-ODT) 4 MG disintegrating tablet 4mg  ODT q4 hours prn nausea/vomit 02/01/23  Yes Azure Barrales, Canary Brim, MD  acetaminophen (TYLENOL) 325 MG tablet Take 2 tablets (650 mg total) by mouth every 6 (six) hours as needed for mild pain (or Fever >/= 101). 07/14/22   Ghimire, Werner Lean, MD  ferrous sulfate 325 (65 FE) MG EC tablet Take 325 mg by mouth 3 (three) times daily with meals. Patient not taking: Reported on 01/02/2023    [provider]  levETIRAcetam (KEPPRA) 500 MG tablet TAKE 1 TABLET BY MOUTH TWICE A DAY 01/25/23   [name], Adam R, DO  Probiotic Product (PROBIOTIC-10 PO) Take by mouth.    [provider]      Allergies    Latex    Review of Systems   Review of Systems  Physical Exam Updated Vital Signs BP (!) 103/58   Pulse 77   Temp 98.7 F (37.1 C) (Oral)   Resp (!) 21   Ht 4\' 11"  (1.499 m)   Wt 77.6 kg   LMP  (LMP Unknown) Comment: pt not co-operating  SpO2 99%   BMI 34.55 kg/m  Physical Exam Vitals and nursing note reviewed.  Constitutional:      General: She is not in acute distress.    Appearance: She is well-developed.  HENT:     Head: Normocephalic and atraumatic.     Mouth/Throat:     Mouth: Mucous membranes are moist.  Eyes:     General: Vision grossly intact. Gaze aligned appropriately.     Extraocular Movements: Extraocular movements  intact.     Conjunctiva/sclera: Conjunctivae normal.  Cardiovascular:     Rate and Rhythm: Normal rate and regular rhythm.     Pulses: Normal pulses.     Heart sounds: Normal heart sounds, S1 normal and S2 normal. No murmur heard.    No friction rub. No gallop.  Pulmonary:     Effort: Pulmonary effort is normal. No respiratory distress.     Breath sounds: Normal breath sounds.  Abdominal:     General: Bowel sounds are normal.     Palpations: Abdomen is soft.     Tenderness: There is no abdominal tenderness. There is no guarding or rebound.     Hernia: No hernia is present.  Musculoskeletal:        General: No swelling.     Cervical back: Full passive range of motion without pain, normal range of motion and neck supple. No spinous process tenderness or muscular tenderness. Normal range of motion.     Right lower leg: No edema.     Left lower leg: No edema.  Skin:    General: Skin is warm and dry.     Capillary Refill: Capillary refill takes less than 2 seconds.     Findings: No ecchymosis, erythema, rash or wound.  Neurological:     General: No focal deficit present.     Mental Status: She is alert and oriented to person, place, and time.     GCS: GCS eye subscore is 4. GCS verbal subscore is 5. GCS motor subscore is 6.     Cranial Nerves: Cranial nerves 2-12 are intact.     Sensory: Sensation is intact.     Motor: Motor function is intact.     Coordination: Coordination is intact.  Psychiatric:        Attention and Perception: Attention normal.        Mood and Affect: Mood normal.        Speech: Speech normal.        Behavior: Behavior normal.     ED Results / Procedures / Treatments   Labs (all labs ordered are listed, but only abnormal results are displayed) Labs Reviewed  CBC WITH DIFFERENTIAL/PLATELET - Abnormal; Notable for the following components:      Result Value   WBC 17.1 (*)    RBC 5.61 (*)    MCV 75.4 (*)    MCH 25.0 (*)    Neutro Abs 14.7 (*)    Abs  Immature Granulocytes 0.14 (*)    All other components within normal limits  COMPREHENSIVE METABOLIC PANEL - Abnormal; Notable for the following components:   Glucose, Bld 107 (*)    AST 13 (*)    All other components within normal limits  LIPASE, BLOOD  HCG, SERUM, QUALITATIVE    EKG EKG Interpretation Date/Time:  Thursday February 01 2023 01:46:17 EDT Ventricular Rate:  90 PR Interval:  166 QRS Duration:  81 QT Interval:  344 QTC Calculation: 421 R Axis:   65  Text Interpretation: Sinus rhythm Normal ECG Confirmed by Gilda Crease [phone]) on 02/01/2023 6:19:53 AM  Radiology No results found.  Procedures Procedures    Medications Ordered in ED Medications  sodium chloride 0.9 % bolus 1,000 mL (0 mLs Intravenous Stopped 02/01/23 0435)  ondansetron (ZOFRAN) injection 4 mg (4 mg Intravenous Given 02/01/23 0334)  metoCLOPramide (REGLAN) injection 10 mg (10 mg Intravenous Given 02/01/23 0334)  levETIRAcetam (KEPPRA) IVPB 1000 mg/100 mL premix (0 mg Intravenous Stopped 02/01/23 0434)  diazepam (VALIUM) injection 2.5 mg (2.5 mg Intravenous Given 02/01/23 6045)    ED Course/ Medical Decision Making/ A&P                             Medical Decision Making Amount and/or Complexity of Data Reviewed Labs: ordered.  Risk Prescription drug management.   Differential diagnosis considered includes, but not limited to: Gastroenteritis; cyclic vomiting; small bowel obstruction; cholecystitis  Presents to the emergency department with concerns over 2 seizures.  She does have a recent diagnosis of new onset seizures, takes Keppra.  She has missed doses of Keppra, however, secondary to nausea and vomiting.  Abdominal exam is benign.  No focal tenderness to suggest acute surgical process.  Lab work reassuring.  She does have a leukocytosis, this appears to be somewhat chronic, as high as 15.5 in the past.  Patient medicated including IV Keppra and antiemetics.  She has been hydrated.   Repeat examination reveals that she is improved.  No further nausea or vomiting.  Tolerating oral intake.  No pain.  Abdominal exam benign.  Will discharge with antiemetics, follow-up with primary care.  Return if symptoms worsen including fever or abdominal pain.  Final Clinical Impression(s) / ED Diagnoses Final diagnoses:  Seizure (HCC)  Nausea and vomiting, unspecified vomiting type    Rx / DC Orders ED Discharge Orders          Ordered    ondansetron (ZOFRAN-ODT) 4 MG disintegrating tablet        02/01/23 4259              Gilda Crease, MD 02/01/23 [phone]

## 2023-02-01 NOTE — ED Triage Notes (Signed)
The pt arrived by gems from home  the pt had a seizure lasting approx one minute and the ems had been called out earlier for the same thing  no tongue damage or incontinence of bowel or bladder  sl unco-operative

## 2023-02-07 ENCOUNTER — Ambulatory Visit: Payer: Self-pay
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
## 2023-02-08 ENCOUNTER — Ambulatory Visit: Payer: Medicaid Other | Admitting: Neurology

## 2023-02-08 ENCOUNTER — Encounter: Payer: Self-pay | Admitting: Neurology

## 2023-02-08 VITALS — BP 112/79 | HR 73 | Ht 59.0 in | Wt 173.0 lb

## 2023-02-08 DIAGNOSIS — Z87820 Personal history of traumatic brain injury: Secondary | ICD-10-CM | POA: Diagnosis not present

## 2023-02-08 DIAGNOSIS — G40309 Generalized idiopathic epilepsy and epileptic syndromes, not intractable, without status epilepticus: Secondary | ICD-10-CM

## 2023-02-12 ENCOUNTER — Encounter: Payer: Self-pay | Admitting: Neurology

## 2023-02-15 NOTE — Telephone Encounter (Signed)
Patient dismissed from [facility] and all providers practicing at this clinic will no longer be able to provide medicare care to you. We believe that our patient / provider relationship has been compromised and thus would request that you seek care elsewhere. 02/12/23

## 2023-03-14 ENCOUNTER — Emergency Department (HOSPITAL_COMMUNITY): Payer: Medicaid Other

## 2023-03-14 ENCOUNTER — Emergency Department (HOSPITAL_COMMUNITY)
Admission: EM | Admit: 2023-03-14 | Discharge: 2023-03-14 | Disposition: A | Discharge status: Home / Self Care | Payer: Medicaid Other | Attending: Emergency Medicine | Admitting: Emergency Medicine

## 2023-03-14 ENCOUNTER — Other Ambulatory Visit: Payer: Self-pay

## 2023-03-14 DIAGNOSIS — J45909 Unspecified asthma, uncomplicated: Secondary | ICD-10-CM | POA: Insufficient documentation

## 2023-03-14 DIAGNOSIS — R9431 Abnormal electrocardiogram [ECG] [EKG]: Secondary | ICD-10-CM | POA: Diagnosis not present

## 2023-03-14 DIAGNOSIS — R569 Unspecified convulsions: Secondary | ICD-10-CM | POA: Insufficient documentation

## 2023-03-14 DIAGNOSIS — Z91040 Latex allergy status: Secondary | ICD-10-CM | POA: Insufficient documentation

## 2023-03-14 DIAGNOSIS — G4489 Other headache syndrome: Secondary | ICD-10-CM | POA: Diagnosis not present

## 2023-03-14 LAB — HCG, SERUM, QUALITATIVE: Preg, Serum: NEGATIVE

## 2023-03-14 LAB — CBC
HCT: 38.7 % (ref 36.0–46.0)
Hemoglobin: 12.7 g/dL (ref 12.0–15.0)
MCH: 24.5 pg — ABNORMAL LOW (ref 26.0–34.0)
MCHC: 32.8 g/dL (ref 30.0–36.0)
MCV: 74.6 fL — ABNORMAL LOW (ref 80.0–100.0)
Platelets: 230 10*3/uL (ref 150–400)
RBC: 5.19 MIL/uL — ABNORMAL HIGH (ref 3.87–5.11)
RDW: 12.9 % (ref 11.5–15.5)
WBC: 10.5 10*3/uL (ref 4.0–10.5)
nRBC: 0 % (ref 0.0–0.2)

## 2023-03-14 LAB — BASIC METABOLIC PANEL
Anion gap: 11 (ref 5–15)
BUN: 12 mg/dL (ref 6–20)
CO2: 25 mmol/L (ref 22–32)
Calcium: 9.2 mg/dL (ref 8.9–10.3)
Chloride: 103 mmol/L (ref 98–111)
Creatinine, Ser: 0.8 mg/dL (ref 0.44–1.00)
GFR, Estimated: >60 mL/min (ref >60)
Glucose, Bld: 104 mg/dL — ABNORMAL HIGH (ref 70–99)
Potassium: 3.7 mmol/L (ref 3.5–5.1)
Sodium: 139 mmol/L (ref 135–145)

## 2023-03-14 MED ORDER — ONDANSETRON HCL 4 MG/2ML IJ SOLN
4.0000 mg | Freq: Once | INTRAMUSCULAR | Status: AC
  Administered 2023-03-14: 4 mg via INTRAVENOUS
  Filled 2023-03-14: qty 2

## 2023-03-14 MED ORDER — KETOROLAC TROMETHAMINE 15 MG/ML IJ SOLN
15.0000 mg | Freq: Once | INTRAMUSCULAR | Status: AC
  Administered 2023-03-14: 15 mg via INTRAVENOUS
  Filled 2023-03-14: qty 1

## 2023-03-14 MED ORDER — LEVETIRACETAM IN NACL 1000 MG/100ML IV SOLN
1000.0000 mg | Freq: Once | INTRAVENOUS | Status: AC
  Administered 2023-03-14: 1000 mg via INTRAVENOUS
  Filled 2023-03-14: qty 100

## 2023-03-14 NOTE — Discharge Instructions (Signed)
Please be aware you may have another seizure ° °Do not drive until seen by your physician for your condition ° °Do not climb ladders/roofs/trees as a seizure can occur at that height and cause serious harm ° °Do not bathe/swim alone as a seizure can occur and cause serious harm ° °Please followup with your physician or neurologist for further testing and possible treatment ° ° °
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
## 2023-03-14 NOTE — ED Notes (Signed)
Patient transported to CT 

## 2023-03-14 NOTE — ED Provider Notes (Signed)
[place] EMERGENCY DEPARTMENT AT [facility] Provider Note   CSN: [id number] Arrival date & time: 03/14/23  0356     History  Chief Complaint  Patient presents with   Seizures    Bailey Rose is a 27 y.o. female.  The history is provided by the patient and a parent.  Patient with history of seizure disorder presents for recurrent seizure.  Mother reports that she heard the patient having a seizure in her bed, and when she walked in the room she was having generalized tonic-clonic seizure that lasted about a minute and terminated spontaneously.  Patient is now back to baseline.  There was no traumatic injuries from the seizure.  Patient did bite her tongue but no urinary incontinence. Patient reports she takes Keppra every day. Last seizure was earlier this month.  She reports she is currently looking for a new neurologist. She is also demanding a CT head.  She reports headache and neck pain which are typical for her after seizures   Past Medical History:  Diagnosis Date   Acne cystica 02/10/2015   Asthma    Delayed gastric emptying 01/08/2020   Related to progesterone effect of pregnancy  Rx Reglan 5mg  q8h prn   Dysmenorrhea 06/24/2013   Migraines    Polycystic ovarian syndrome    Premenstrual syndrome 05/01/2021   Sickle cell trait (HCC)     Home Medications Prior to Admission medications   Medication Sig Start Date End Date Taking? Authorizing Provider  acetaminophen (TYLENOL) 325 MG tablet Take 2 tablets (650 mg total) by mouth every 6 (six) hours as needed for mild pain (or Fever >/= 101). 07/14/22   Ghimire, Werner Lean, MD  levETIRAcetam (KEPPRA) 500 MG tablet TAKE 1 TABLET BY MOUTH TWICE A DAY 01/25/23   [name], Adam R, DO  ondansetron (ZOFRAN-ODT) 4 MG disintegrating tablet 4mg  ODT q4 hours prn nausea/vomit 02/01/23   Pollina, Canary Brim, MD  Probiotic Product (PROBIOTIC-10 PO) Take by mouth.    [provider]      Allergies    Latex     Review of Systems   Review of Systems  Constitutional:  Negative for fever.  Musculoskeletal:  Positive for neck pain.  Neurological:  Positive for seizures and headaches.    Physical Exam Updated Vital Signs BP 117/77 (BP Location: Right Arm)   Pulse 91   Temp 98.3 F (36.8 C) (Oral)   Resp 16   SpO2 100%  Physical Exam CONSTITUTIONAL: Well developed/well nourished HEAD: Normocephalic/atraumatic EYES: EOMI/PERRL, no nystagmus ENMT: Mucous membranes moist, small tongue laceration no active bleeding NECK: supple no meningeal signs SPINE/BACK:entire spine nontender CV: S1/S2 noted, no murmurs/rubs/gallops noted LUNGS: Lungs are clear to auscultation bilaterally, no apparent distress ABDOMEN: soft, nontender, no rebound or guarding, bowel sounds noted throughout abdomen GU:no cva tenderness NEURO: Pt is awake/alert/appropriate, moves all extremitiesx4.  No facial droop.  No arm or leg drift EXTREMITIES: pulses normal/equal, full ROM, no deformities SKIN: warm, color normal PSYCH: no abnormalities of mood noted, alert and oriented to situation  ED Results / Procedures / Treatments   Labs (all labs ordered are listed, but only abnormal results are displayed) Labs Reviewed  CBC - Abnormal; Notable for the following components:      Result Value   RBC 5.19 (*)    MCV 74.6 (*)    MCH 24.5 (*)    All other components within normal limits  BASIC METABOLIC PANEL - Abnormal; Notable for the following components:  Glucose, Bld 104 (*)    All other components within normal limits  HCG, SERUM, QUALITATIVE    EKG EKG Interpretation Date/Time:  Wednesday March 14 2023 04:24:12 EDT Ventricular Rate:  81 PR Interval:  181 QRS Duration:  76 QT Interval:  343 QTC Calculation: 399 R Axis:   66  Text Interpretation: Sinus rhythm No significant change since last tracing Confirmed by Zadie Rhine ([id number]) on 03/14/2023 4:51:12 AM  Radiology CT Head Wo Contrast  Result  Date: 03/14/2023 CLINICAL DATA:  27 year old female status post seizure at home. Right tongue trauma. Headache. EXAM: CT HEAD WITHOUT CONTRAST TECHNIQUE: Contiguous axial images were obtained from the base of the skull through the vertex without intravenous contrast. RADIATION DOSE REDUCTION: This exam was performed according to the departmental dose-optimization program which includes automated exposure control, adjustment of the mA and/or kV according to patient size and/or use of iterative reconstruction technique. COMPARISON:  Brain MRI 07/14/2022.  Head CT 09/17/2022. FINDINGS: Brain: Cerebral volume remains normal. No midline shift, ventriculomegaly, mass effect, evidence of mass lesion, intracranial hemorrhage or evidence of cortically based acute infarction. Gray-white matter differentiation is within normal limits throughout the brain. Vascular: Stable.  No suspicious intracranial vascular hyperdensity. Skull: No fracture identified. Sinuses/Orbits: Visualized paranasal sinuses and mastoids are clear. Other: No orbit or scalp soft tissue injury identified. IMPRESSION: No acute traumatic injury identified. Stable and normal noncontrast CT appearance of the brain. Electronically Signed   By: Odessa Fleming M.D.   On: 03/14/2023 04:55    Procedures Procedures    Medications Ordered in ED Medications  ketorolac (TORADOL) 15 MG/ML injection 15 mg (15 mg Intravenous Given 03/14/23 0500)  ondansetron (ZOFRAN) injection 4 mg (4 mg Intravenous Given 03/14/23 0458)  levETIRAcetam (KEPPRA) IVPB 1000 mg/100 mL premix (0 mg Intravenous Stopped 03/14/23 0516)    ED Course/ Medical Decision Making/ A&P Clinical Course as of 03/14/23 3474  Wed Mar 14, 2023  2595 Patient reports compliance with her Keppra every day.  Reports she is looking for a new neurologist.  Patient and mother demanding CT head, but I advised them that she is already had MRI brain and CT head previously as well as an EEG [DW]  0528 CT head  unremarkable.  Labs overall reassuring.  Patient already requesting discharge home.  She admits that she only takes 750 Keppra daily but had originally been told to take 500 twice daily.  This could be contributing to her recent seizures.  She will be advised to go back to her original dosing.  She has been referred to a local neurologist [DW]  [phone] Patient resting comfortably, no new seizures.  Patient would like to be discharged home.  We discussed her medications.  She reports she can only really tolerate Keppra 750 a day as a 1000 mg made her too drowsy.  Ambulatory referral has been given for neurology.  [DW]    Clinical Course User Index [DW] Zadie Rhine, MD                                 Medical Decision Making Amount and/or Complexity of Data Reviewed Labs: ordered. Radiology: ordered. ECG/medicine tests: ordered.  Risk Prescription drug management.   This patient presents to the ED for concern of seizures, this involves an extensive number of treatment options, and is a complaint that carries with it a high risk of complications and morbidity.  The differential diagnosis  includes but is not limited to status epilepticus, syncope, psychogenic nonepileptic seizures  Comorbidities that complicate the patient evaluation: Patient's presentation is complicated by their history of previous seizures  Social Determinants of Health: Patient's  difficulty finding neurology care   increases the complexity of managing their presentation  Additional history obtained: Additional history obtained from family Records reviewed  neurology notes  Lab Tests: I Ordered, and personally interpreted labs.  The pertinent results include: Labs overall unremarkable  Imaging Studies ordered: I ordered imaging studies including CT scan head   I independently visualized and interpreted imaging which showed CT head negative for acute process I agree with the radiologist  interpretation  Cardiac Monitoring: The patient was maintained on a cardiac monitor.  I personally viewed and interpreted the cardiac monitor which showed an underlying rhythm of:  sinus rhythm  Medicines ordered and prescription drug management: I ordered medication including Toradol for headache Keppra for seizures Reevaluation of the patient after these medicines showed that the patient    improved    Reevaluation: After the interventions noted above, I reevaluated the patient and found that they have :improved  Complexity of problems addressed: Patient's presentation is most consistent with  acute presentation with potential threat to life or bodily function  Disposition: After consideration of the diagnostic results and the patient's response to treatment,  I feel that the patent would benefit from discharge   .           Final Clinical Impression(s) / ED Diagnoses Final diagnoses:  Seizure (HCC)    Rx / DC Orders ED Discharge Orders          Ordered    Ambulatory referral to Neurology       Comments: An appointment is requested in approximately: 2 weeks   03/14/23 0454              Zadie Rhine, MD 03/14/23 [phone]

## 2023-05-16 ENCOUNTER — Ambulatory Visit: Payer: Medicaid Other | Admitting: Neurology

## 2023-05-16 ENCOUNTER — Encounter: Payer: Self-pay | Admitting: Neurology

## 2023-07-23 ENCOUNTER — Ambulatory Visit: Payer: Medicaid Other | Admitting: Neurology

## 2023-08-07 ENCOUNTER — Ambulatory Visit: Payer: Medicaid Other | Admitting: Neurology

## 2023-08-29 ENCOUNTER — Ambulatory Visit: Payer: Medicaid Other | Admitting: Student

## 2023-08-29 VITALS — BP 128/76 | HR 100 | Ht 59.0 in | Wt 172.7 lb

## 2023-08-29 DIAGNOSIS — R569 Unspecified convulsions: Secondary | ICD-10-CM

## 2023-08-29 DIAGNOSIS — F418 Other specified anxiety disorders: Secondary | ICD-10-CM

## 2023-08-29 DIAGNOSIS — E669 Obesity, unspecified: Secondary | ICD-10-CM | POA: Diagnosis not present

## 2023-08-29 DIAGNOSIS — Z0000 Encounter for general adult medical examination without abnormal findings: Secondary | ICD-10-CM

## 2023-08-29 DIAGNOSIS — E66811 Obesity, class 1: Secondary | ICD-10-CM

## 2023-08-29 DIAGNOSIS — F339 Major depressive disorder, recurrent, unspecified: Secondary | ICD-10-CM | POA: Diagnosis present

## 2023-08-29 DIAGNOSIS — Z6834 Body mass index (BMI) 34.0-34.9, adult: Secondary | ICD-10-CM

## 2023-08-29 NOTE — Progress Notes (Unsigned)
CC: Follow-up  HPI:  [name] is a 28 y.o. female living with a history stated below and presents today for follow-up. Please see problem based assessment and plan for additional details.  Past Medical History:  Diagnosis Date   Acne cystica 02/10/2015   Asthma    Delayed gastric emptying 01/08/2020   Related to progesterone effect of pregnancy  Rx Reglan 5mg  q8h prn   Dysmenorrhea 06/24/2013   Migraines    Polycystic ovarian syndrome    Premenstrual syndrome 05/01/2021   Sickle cell trait (HCC)     Current Outpatient Medications on File Prior to Visit  Medication Sig Dispense Refill   acetaminophen (TYLENOL) 325 MG tablet Take 2 tablets (650 mg total) by mouth every 6 (six) hours as needed for mild pain (or Fever >/= 101).     levETIRAcetam (KEPPRA) 500 MG tablet TAKE 1 TABLET BY MOUTH TWICE A DAY 60 tablet 5   ondansetron (ZOFRAN-ODT) 4 MG disintegrating tablet 4mg  ODT q4 hours prn nausea/vomit 10 tablet 0   Probiotic Product (PROBIOTIC-10 PO) Take by mouth.     No current facility-administered medications on file prior to visit.    Family History  Problem Relation Age of Onset   Sickle cell anemia Mother    Breast cancer Mother    Cancer Maternal Grandmother        lung   Migraines Maternal Grandmother    Lupus Maternal Grandmother     Social History   Socioeconomic History   Marital status: Single    Spouse name: Not on file   Number of children: Not on file   Years of education: Not on file   Highest education level: Not on file  Occupational History   Not on file  Tobacco Use   Smoking status: Some Days    Current packs/day: 0.25    Average packs/day: 0.3 packs/day for 2.0 years (0.5 ttl pk-yrs)    Types: Cigarettes, E-cigarettes   Smokeless tobacco: Never   Tobacco comments:    Smoking 3-4 cigs per day  Vaping Use   Vaping status: Never Used  Substance and Sexual Activity   Alcohol use: Yes    Comment: States socially/ while on  vacation   Drug use: Not Currently    Types: Marijuana   Sexual activity: Yes    Partners: Male    Birth control/protection: Condom, None    Comment: Non-latex condoms  Other Topics Concern   Not on file  Social History Narrative   Not on file   Social Drivers of Health   Financial Resource Strain: Low Risk  (07/27/2022)   Overall Financial Resource Strain (CARDIA)    Difficulty of Paying Living Expenses: Not very hard  Food Insecurity: Food Insecurity Present (07/27/2022)   Hunger Vital Sign    Worried About Running Out of Food in the Last Year: Sometimes true    Ran Out of Food in the Last Year: Sometimes true  Transportation Needs: No Transportation Needs (07/27/2022)   PRAPARE - Administrator, Civil Service (Medical): No    Lack of Transportation (Non-Medical): No  Physical Activity: Not on file  Stress: Not on file  Social Connections: Unknown (05/06/2023)   Received from [facility]   Social Network    Social Network: Not on file  Intimate Partner Violence: Unknown (05/06/2023)   Received from [facility]   HITS    Physically Hurt: Not on file    Insult or Talk Down To:  Not on file    Threaten Physical Harm: Not on file    Scream or Curse: Not on file    Review of Systems: ROS negative except for what is noted on the assessment and plan.  Vitals:   08/29/23 1532  BP: 128/76  Pulse: 100  SpO2: 98%  Weight: 172 lb 11.2 oz (78.3 kg)  Height: 4\' 11"  (1.499 m)    Physical Exam: Constitutional: well-appearing, sitting in chair, in no acute distress Cardiovascular: regular rate and rhythm, no m/r/g Pulmonary/Chest: normal work of breathing on room air, lungs clear to auscultation bilaterally Abdominal: soft, non-tender, non-distended MSK: normal bulk and tone Skin: warm and dry Psych: normal mood and behavior  Assessment & Plan:     Patient discussed with Dr. Cleda Daub  Seizures (HCC) Began in 2023. Follows with Neurology with  appointment 2/20. Takes Keppra 500 mg BID. Endorses one seizure in December that woke her from sleep that was short in duration without post-ictal symptoms.   MDD (major depressive disorder), recurrent episode (HCC) PHQ-9 and Gad-7 are 18 and 13 respectively. Denies SI/HI. Has never taken anti-depressant/anxiolytic. Symptoms began after seizures started in 2023 as patient had been doing well with life goals and these were put on hold after she began having seizures. Patient open to starting medication but would like to try counseling first. -Referral to [facility]   Health care maintenance Referral to OBGYN for cervical cancer screening. Patient will request HPV vaccine at visit.   Obesity (BMI 30.0-34.9) Did not discuss this today but consider Wegovy in the future.   Bailey Rose, D.O. [facility] Internal Medicine, PGY-1 Phone: [phone] Date 08/30/2023 Time 9:08 AM

## 2023-08-29 NOTE — Patient Instructions (Addendum)
I have sent in a referral for both behavioral health and OBGYN.  Follow-up in one month regarding our discussion today.

## 2023-08-30 DIAGNOSIS — F329 Major depressive disorder, single episode, unspecified: Secondary | ICD-10-CM | POA: Insufficient documentation

## 2023-08-30 DIAGNOSIS — F339 Major depressive disorder, recurrent, unspecified: Secondary | ICD-10-CM | POA: Insufficient documentation

## 2023-08-30 DIAGNOSIS — E66811 Obesity, class 1: Secondary | ICD-10-CM | POA: Insufficient documentation

## 2023-08-30 DIAGNOSIS — F418 Other specified anxiety disorders: Secondary | ICD-10-CM | POA: Insufficient documentation

## 2023-08-30 NOTE — Assessment & Plan Note (Signed)
Did not discuss this today but consider Wegovy in the future.

## 2023-08-30 NOTE — Assessment & Plan Note (Addendum)
Began in 2023. Follows with Neurology with appointment 2/20. Takes Keppra 500 mg BID. Endorses one seizure in December that woke her from sleep that was short in duration without post-ictal symptoms.

## 2023-08-30 NOTE — Assessment & Plan Note (Addendum)
PHQ-9 and Gad-7 are 18 and 13 respectively. Denies SI/HI. Has never taken anti-depressant/anxiolytic. Symptoms began after seizures started in 2023 as patient had been doing well with life goals and these were put on hold after she began having seizures. Patient open to starting medication but would like to try counseling first. -Referral to [facility]

## 2023-08-30 NOTE — Assessment & Plan Note (Signed)
Referral to OBGYN for cervical cancer screening. Patient will request HPV vaccine at visit.

## 2023-09-03 NOTE — Progress Notes (Signed)
 Internal Medicine Clinic Attending  Case discussed with the resident at the time of the visit.  We reviewed the resident's history and exam and pertinent patient test results.  I agree with the assessment, diagnosis, and plan of care documented in the resident's note.

## 2023-09-18 ENCOUNTER — Other Ambulatory Visit (HOSPITAL_COMMUNITY)
Admission: RE | Admit: 2023-09-18 | Discharge: 2023-09-18 | Disposition: A | Discharge status: Home / Self Care | Source: Ambulatory Visit | Attending: Obstetrics and Gynecology | Admitting: Obstetrics and Gynecology

## 2023-09-18 ENCOUNTER — Ambulatory Visit (INDEPENDENT_AMBULATORY_CARE_PROVIDER_SITE_OTHER): Payer: Medicaid Other

## 2023-09-18 VITALS — BP 125/84 | HR 94

## 2023-09-18 DIAGNOSIS — Z113 Encounter for screening for infections with a predominantly sexual mode of transmission: Secondary | ICD-10-CM | POA: Diagnosis not present

## 2023-09-18 NOTE — Progress Notes (Signed)
 SUBJECTIVE:  28 y.o. female who desires a STI screen. Denies abnormal vaginal discharge, bleeding or significant pelvic pain. No UTI symptoms. Denies history of known exposure to STD.  Patient's last menstrual period was 09/16/2023 (approximate).  OBJECTIVE:  She appears well.   ASSESSMENT:  STI Screen   PLAN:  Pt offered STI blood screening-requested GC, chlamydia, and trichomonas probe sent to lab.  Treatment: To be determined once lab results are received.  Pt follow up as needed.

## 2023-09-19 LAB — HEPATITIS C ANTIBODY: Hep C Virus Ab: NONREACTIVE

## 2023-09-19 LAB — RPR: RPR Ser Ql: NONREACTIVE

## 2023-09-19 LAB — HEPATITIS B SURFACE ANTIGEN: Hepatitis B Surface Ag: NEGATIVE

## 2023-09-19 LAB — HIV ANTIBODY (ROUTINE TESTING W REFLEX): HIV Screen 4th Generation wRfx: NONREACTIVE

## 2023-09-20 ENCOUNTER — Other Ambulatory Visit: Payer: Self-pay | Admitting: Obstetrics & Gynecology

## 2023-09-20 ENCOUNTER — Encounter: Payer: Self-pay | Admitting: Obstetrics & Gynecology

## 2023-09-20 DIAGNOSIS — B9689 Other specified bacterial agents as the cause of diseases classified elsewhere: Secondary | ICD-10-CM

## 2023-09-20 LAB — CERVICOVAGINAL ANCILLARY ONLY
Bacterial Vaginitis (gardnerella): POSITIVE — AB
Candida Glabrata: NEGATIVE
Candida Vaginitis: NEGATIVE
Chlamydia: NEGATIVE
Comment: NEGATIVE
Comment: NEGATIVE
Comment: NEGATIVE
Comment: NEGATIVE
Comment: NEGATIVE
Comment: NORMAL
Neisseria Gonorrhea: NEGATIVE
Trichomonas: NEGATIVE

## 2023-09-20 MED ORDER — METRONIDAZOLE 500 MG PO TABS
500.0000 mg | ORAL_TABLET | Freq: Two times a day (BID) | ORAL | 0 refills | Status: AC
Start: 2023-09-20 — End: 2023-09-27

## 2023-09-21 NOTE — Telephone Encounter (Signed)
 This message was addressed on a different mychart message.  Felecia Shelling CMA

## 2023-09-26 ENCOUNTER — Ambulatory Visit: Payer: Medicaid Other | Admitting: Internal Medicine

## 2023-09-26 VITALS — BP 108/69 | HR 73 | Wt 173.6 lb

## 2023-09-26 DIAGNOSIS — F339 Major depressive disorder, recurrent, unspecified: Secondary | ICD-10-CM | POA: Diagnosis present

## 2023-09-26 DIAGNOSIS — F331 Major depressive disorder, recurrent, moderate: Secondary | ICD-10-CM

## 2023-09-26 NOTE — Progress Notes (Signed)
 Subjective:  CC: anxiety  HPI:  [name] is a 28 y.o. female with a past medical history of seizures, depression, and obesity. Her last office visit was in February.  She is here to follow-up on depression and to talk ask about emotional support animal letter.  Please see problem based assessment and plan for additional details.  Past Medical History:  Diagnosis Date   Acne cystica 02/10/2015   Asthma    Delayed gastric emptying 01/08/2020   Related to progesterone effect of pregnancy  Rx Reglan 5mg  q8h prn   Dysmenorrhea 06/24/2013   Migraines    Polycystic ovarian syndrome    Premenstrual syndrome 05/01/2021   Sickle cell trait (HCC)     MEDICATIONS:  Keppra 500 mg twice daily Metronidazole (BV) end date 09/27/23  Family History  Problem Relation Age of Onset   Sickle cell anemia Mother    Breast cancer Mother    Cancer Maternal Grandmother        lung   Migraines Maternal Grandmother    Lupus Maternal Grandmother     Past Surgical History:  Procedure Laterality Date   TONSILLECTOMY     WISDOM TOOTH EXTRACTION  2017     Social History   Socioeconomic History   Marital status: Single    Spouse name: Not on file   Number of children: Not on file   Years of education: Not on file   Highest education level: Not on file  Occupational History   Not on file  Tobacco Use   Smoking status: Some Days    Current packs/day: 0.25    Average packs/day: 0.3 packs/day for 2.0 years (0.5 ttl pk-yrs)    Types: Cigarettes, E-cigarettes   Smokeless tobacco: Never   Tobacco comments:    Smoking 3-4 cigs per day  Vaping Use   Vaping status: Never Used  Substance and Sexual Activity   Alcohol use: Yes    Comment: States socially/ while on vacation   Drug use: Not Currently    Types: Marijuana   Sexual activity: Yes    Partners: Male    Birth control/protection: Condom, None    Comment: Non-latex condoms  Other Topics Concern   Not on file  Social  History Narrative   Not on file   Social Drivers of Health   Financial Resource Strain: Low Risk  (07/27/2022)   Overall Financial Resource Strain (CARDIA)    Difficulty of Paying Living Expenses: Not very hard  Food Insecurity: Food Insecurity Present (07/27/2022)   Hunger Vital Sign    Worried About Running Out of Food in the Last Year: Sometimes true    Ran Out of Food in the Last Year: Sometimes true  Transportation Needs: No Transportation Needs (07/27/2022)   PRAPARE - Administrator, Civil Service (Medical): No    Lack of Transportation (Non-Medical): No  Physical Activity: Not on file  Stress: Not on file  Social Connections: Unknown (05/06/2023)   Received from [facility]   Social Network    Social Network: Not on file  Intimate Partner Violence: Unknown (05/06/2023)   Received from [facility]   HITS    Physically Hurt: Not on file    Insult or Talk Down To: Not on file    Threaten Physical Harm: Not on file    Scream or Curse: Not on file    Review of Systems: ROS negative except for what is noted on the assessment and plan.  Objective:   Vitals:   09/26/23 1038  BP: 108/69  Pulse: 73  SpO2: 98%  Weight: 173 lb 9.6 oz (78.7 kg)    Physical Exam: Constitutional: well-appearing, in no acute distress Cardiovascular: regular rate and rhythm, no m/r/g Pulmonary/Chest: normal work of breathing on room air, lungs clear to auscultation bilaterally Abdominal: soft, non-tender, non-distended MSK: normal bulk and tone Skin: warm and dry     08/29/2023    4:29 PM 07/27/2022   11:51 AM 09/12/2021   11:17 AM  Depression screen PHQ 2/9  Decreased Interest 3 1 0  Down, Depressed, Hopeless 2 0 0  PHQ - 2 Score 5 1 0  Altered sleeping 3 2   Tired, decreased energy 3 1   Change in appetite 2 3   Feeling bad or failure about yourself  2 1   Trouble concentrating 2 2   Moving slowly or fidgety/restless 0 2   Suicidal thoughts 1 0   PHQ-9 Score 18 12    Difficult doing work/chores Very difficult Somewhat difficult        08/29/2023    4:29 PM 07/27/2022   12:01 PM 11/26/2019   11:13 AM  GAD 7 : Generalized Anxiety Score  Nervous, Anxious, on Edge 2 1 1   Control/stop worrying 1 1 1   Worry too much - different things 2 1 1   Trouble relaxing 2 2 1   Restless 2 2 1   Easily annoyed or irritable 2 2 1   Afraid - awful might happen 2 0 1  Total GAD 7 Score 13 9 7   Anxiety Difficulty Very difficult --     Assessment & Plan:  MDD (major depressive disorder), recurrent episode (HCC) Seen in February and PHQ-9 and GAD noticed to be elevated.  She denied suicidal and homicidal ideation.  She is not interested in taking medication for depression or anxiety as she has had family members who have taken them and she has noticed side effects from medications.  She is starting counseling soon with Bailey Rose.  Would like an emotional support animal letter sent as her apartment complex requires this.  She owns a pet bowl and is currently doing training with them. P: She declined completing PHQ-9 and GAD today Letter completed for patient Encouraged her to follow-up with [facility]    Patient discussed with Dr. Wille Celeste Ingrid Shifrin, D.O. [facility] Internal Medicine  PGY-3 Pager: [phone]  Phone: [phone] Date 09/26/2023  Time 3:22 PM

## 2023-09-26 NOTE — Assessment & Plan Note (Addendum)
 Seen in February and PHQ-9 and GAD noticed to be elevated.  She denied suicidal and homicidal ideation.  She is not interested in taking medication for depression or anxiety as she has had family members who have taken them and she has noticed side effects from medications.  She is starting counseling soon with Colleen Can.  Would like an emotional support animal letter sent as her apartment complex requires this.  She owns a pet bowl and is currently doing training with them. P: She declined completing PHQ-9 and GAD today Letter completed for patient Encouraged her to follow-up with [facility]

## 2023-09-27 NOTE — Progress Notes (Signed)
 Internal Medicine Clinic Attending  Case discussed with the resident at the time of the visit.  We reviewed the resident's history and exam and pertinent patient test results.  I agree with the assessment, diagnosis, and plan of care documented in the resident's note.

## 2023-10-01 ENCOUNTER — Ambulatory Visit (INDEPENDENT_AMBULATORY_CARE_PROVIDER_SITE_OTHER): Admitting: Licensed Clinical Social Worker

## 2023-10-01 DIAGNOSIS — F331 Major depressive disorder, recurrent, moderate: Secondary | ICD-10-CM | POA: Diagnosis present

## 2023-10-01 NOTE — BH Specialist Note (Signed)
 Integrated Behavioral Health Follow Up In-Person Visit  MRN: [id number] Name: Bailey Rose  Number of Integrated Behavioral Health Clinician visits: Additional Visit  Session Start time: 1430   Session End time: 1536  Total time in minutes: 66   Types of Service: Individual psychotherapy and General Behavioral Integrated Care (BHI)  Interpretor:No. Interpretor Name and Language: N/A  Subjective: Bailey Rose is a 28 y.o. female   Patient was referred by PCP for [facility]. Patient reports the following symptoms/concerns: Today's 60-minute session with a 28 year old female patient was conducted by an LCSW-A, [facility]. The patient presented with ongoing struggles related to **anger**, **depression**, and **impulsive decision-making**. Despite these challenges, she was cooperative and appeared to be in good spirits throughout the session. This positive demeanor was encouraging and set a constructive tone for the discussion.  The patient dedicated a significant portion of the session to discussing her family dynamics, recent incidents, and significant changes that have occurred since her last meeting with the [facility] nearly a year ago. This open dialogue provided valuable insights into her current emotional state and the factors influencing her behaviors. The [facility] actively listened and offered support, creating a safe space for the patient to express herself freely.  To address her anger and impulsivity, the [facility] reviewed and emphasized the importance of **coping skills**. The patient will focus on developing strategies to slow down her anger responses, which will be a key area of improvement in upcoming sessions. By practicing these skills, the patient aims to enhance her emotional regulation and make more thoughtful decisions.  Notably, the patient exhibits **great leadership skills**, which the [facility] and patient will explore further. This aspect of her personality could be leveraged as a strength in her  personal and professional life, potentially enhancing her overall well-being and resilience. By building on these strengths, the patient may find additional motivation to work through her challenges.  The patient is scheduled to meet with the [facility] **bi-weekly**. This regular engagement will allow for consistent support and monitoring of her progress in managing anger, depression, and impulsivity, while also nurturing her leadership abilities. The [facility] will continue to provide guidance and encouragement as the patient works towards her goals, ensuring a supportive environment for her growth and development.    Objective: Mood: NA and Affect: Appropriate Risk of harm to self or others: No plan to harm self or others   Patient and/or Family's Strengths/Protective Factors: Social connections  Goals Addressed: Patient will:  Reduce symptoms of: depression and mood instability   Increase knowledge and/or ability of: coping skills   Demonstrate ability to: Increase motivation to adhere to plan of care  Progress towards Goals: Ongoing  Interventions: Interventions utilized:  CBT Cognitive Behavioral Therapy Standardized Assessments completed: PHQ-SADS    08/29/2023    4:29 PM 07/27/2022   12:01 PM 07/27/2022   11:51 AM  PHQ-SADS Last 3 Score only  Total GAD-7 Score 13 9   PHQ Adolescent Score 17  12     Patient and/or Family Response: Patient agrees to services  Patient Centered Plan: Patient is on the following Treatment Plan(s): OPT Assessment: Patient currently experiencing Anger, Depression .   Patient may benefit from OPT.  Plan: Follow up with behavioral health clinician on : April 3  Christen Butter, MSW, LCSW-A She/Her Behavioral Health Clinician [facility]  Internal Medicine Center Direct [phone]  Fax [phone] Main Office Phone: [phone] [address] [place] [place], [place] [address] Website: [facility] Internal Medicine Center  Primary Care  [place], [place]  [place]

## 2023-10-01 NOTE — BH Specialist Note (Signed)
 j

## 2023-10-18 ENCOUNTER — Telehealth: Payer: Self-pay | Admitting: Internal Medicine

## 2023-10-18 ENCOUNTER — Ambulatory Visit (INDEPENDENT_AMBULATORY_CARE_PROVIDER_SITE_OTHER): Admitting: Licensed Clinical Social Worker

## 2023-10-18 DIAGNOSIS — F331 Major depressive disorder, recurrent, moderate: Secondary | ICD-10-CM | POA: Diagnosis present

## 2023-10-18 NOTE — BH Specialist Note (Signed)
 Integrated Behavioral Health Follow Up In-Person Visit  MRN: [id number] Name: Bailey Rose  Number of Integrated Behavioral Health Clinician visits: Additional Visit  Session Start time: 0945   Session End time: 1045  Total time in minutes: 60   TTypes of Service: Individual psychotherapy and General Behavioral Integrated Care (BHI)   Interpretor:No. Interpretor Name and Language: N/A   Subjective: Bailey Rose is a 28 y.o. female    Patient was referred by PCP for [facility]. Patient reports the following symptoms/concerns: Today's 60-minute session with a 28 year old female patient was conducted by an LCSW-A, [facility]. The patient presented with ongoing struggles related to **anger**, **depression**, and **impulsive decision-making**. Despite these challenges, she was cooperative and appeared to be in good spirits throughout the session. This positive demeanor was encouraging and set a constructive tone for the discussion.   Following are the goals we discussed today:   Patient reports following Symptoms with Keppra. Patient will report the following symptoms to her neurologist.    Aggressive or angry anxiety change in personality crying combativeness loss of appetite quick to react or overreact emotionally rapidly changing moods  Cognitive Restructuring Worksheet Goal: Challenge negative or distorted thoughts that fuel anger.  Worksheet Instructions:  Angry Thought: Write down the thought that made you angry (e.g., "She always ignores me.").  Cognitive Distortions: Identify any cognitive distortions (e.g., overgeneralizing, catastrophizing) in the thought.  Alternative Thought: Replace the angry thought with a more balanced and realistic thought (e.g., "She was busy, and it doesn't mean she doesn't care.").  Effect of New Thought: How does the new, balanced thought make you feel about the situation?  Example: Angry Thought: "They never listen to me." Cognitive  Distortion: Overgeneralizing. Alternative Thought: "There are times when I feel unheard, but not always." Effect: I feel less frustrated and more understanding.  Look in [place]  V.A benefits- 2018  speaking with a veterans' advocate or legal professional if you're seeking to reclaim eligibility for VA benefits      Forgiveness doesn't mean you condone the person's actions, but it's about freeing yourself from the grip of anger and resentment.   Forgiving someone is often more for your own peace than for the other person. It can help you let go of negative emotions that can weigh you down.    You don't have to forgive right away. It's a process that can take time, and it's okay to take it at your own pace.     Objective: Mood: NA and Affect: Appropriate Risk of harm to self or others: No plan to harm self or others     Patient and/or Family's Strengths/Protective Factors: Social connections   Goals Addressed: Patient will:  Reduce symptoms of: depression and mood instability   Increase knowledge and/or ability of: coping skills   Demonstrate ability to: Increase motivation to adhere to plan of care   Progress towards Goals: Ongoing   Interventions: Interventions utilized:  CBT Cognitive Behavioral Therapy Standardized Assessments completed: PHQ-SADS     Patient and/or Family Response: Patient agrees to services   Patient Centered Plan: Patient is on the following Treatment Plan(s): OPT Assessment: Patient currently experiencing Anger, Depression .    Patient may benefit from OPT.   Plan: Follow up with behavioral health clinician on : April 10; In-person    Christen Butter, MSW, LCSW-A She/Her Behavioral Health Clinician [facility]  Internal Medicine Center Direct [phone]  Fax [phone] Main Office Phone: [phone] [address] [place] [place], [place] [address]  Website: [facility] Internal Medicine [facility]   [place], [place]  [facility]

## 2023-10-18 NOTE — Telephone Encounter (Addendum)
 Duplicate message. Also [facility] mailing information for today's visit.   Copied from CRM [id number]. Topic: General - Other >> Oct 18, 2023 12:16 PM Carrielelia G wrote: Reason for CRM:  Pt . Bailey Rose would like a copy of her After Visit Summary. She said it is not populating in [place]. Please advise.

## 2023-10-18 NOTE — Patient Instructions (Addendum)
 Visit Information  Thank you for taking time to visit with me today. Please don't hesitate to contact me if I can be of assistance to you.   Following are the goals we discussed today:   Patient reports following Symptoms with Keppra. Patient will report the following symptoms to her neurologist.    Aggressive or angry anxiety change in personality crying combativeness loss of appetite quick to react or overreact emotionally rapidly changing moods  Cognitive Restructuring Worksheet Goal: Challenge negative or distorted thoughts that fuel anger.  Worksheet Instructions:  Angry Thought: Write down the thought that made you angry (e.g., "She always ignores me.").  Cognitive Distortions: Identify any cognitive distortions (e.g., overgeneralizing, catastrophizing) in the thought.  Alternative Thought: Replace the angry thought with a more balanced and realistic thought (e.g., "She was busy, and it doesn't mean she doesn't care.").  Effect of New Thought: How does the new, balanced thought make you feel about the situation?  Example: Angry Thought: "They never listen to me." Cognitive Distortion: Overgeneralizing. Alternative Thought: "There are times when I feel unheard, but not always." Effect: I feel less frustrated and more understanding.  Look in [place]  V.A benefits- 2018  speaking with a veterans' advocate or legal professional if you're seeking to reclaim eligibility for VA benefits      Forgiveness doesn't mean you condone the person's actions, but it's about freeing yourself from the grip of anger and resentment.   Forgiving someone is often more for your own peace than for the other person. It can help you let go of negative emotions that can weigh you down.    You don't have to forgive right away. It's a process that can take time, and it's okay to take it at your own pace.  Follow up: April 10 th, In person at 1:30 pm

## 2023-10-18 NOTE — Telephone Encounter (Signed)
 Copied from CRM [id number]. Topic: General - Other >> Oct 18, 2023 12:16 PM Carrielelia G wrote: Reason for CRM:  Pt . Bailey Rose would like a copy of her After Visit Summary. She said it is not populating in [place]. Please advise.  AV Summary being mailed to the patient.

## 2023-10-23 ENCOUNTER — Telehealth: Payer: Self-pay | Admitting: *Deleted

## 2023-10-23 NOTE — Telephone Encounter (Signed)
 Copied from CRM [id number]. Topic: General - Other >> Oct 23, 2023 11:10 AM Tiffany H wrote: Reason for CRM: Patient called to speak with Marena Chancy about her case. She would like to speak with Marena Chancy about changing her discharge date - this will require a Geographical information systems officer. She'd like to know if we can have that completed by her 10/27/23 appointment. Please assist.

## 2023-10-25 ENCOUNTER — Ambulatory Visit (INDEPENDENT_AMBULATORY_CARE_PROVIDER_SITE_OTHER): Admitting: Licensed Clinical Social Worker

## 2023-10-25 DIAGNOSIS — F331 Major depressive disorder, recurrent, moderate: Secondary | ICD-10-CM | POA: Diagnosis present

## 2023-10-25 NOTE — BH Specialist Note (Signed)
 Integrated Behavioral Health Follow Up In-Person Visit  MRN: [id number] Name: Bailey Rose  Number of Integrated Behavioral Health Clinician visits: Additional Visit  Session Start time: 0945   Session End time: 1045  Total time in minutes: 60   Types of Service: Individual psychotherapy  Interpretor:No. Interpretor Name and Language: N/A  Subjective: Bailey Rose is a 28 y.o. female  Patient was referred by PCP for [facility]. Patient reports the following symptoms/concerns: [facility] met today to discuss her PTSD. Patient and [facility] discussed a character letter that [facility] drafted in favor of changing patient dishonorable status to honorable. Letter was printed for patient who will present to the Board for approval.   Objective: Mood: NA and Affect: Appropriate Risk of harm to self or others: No plan to harm self or others     Patient and/or Family's Strengths/Protective Factors: Social connections   Goals Addressed: Patient will:  Reduce symptoms of: depression and mood instability   Increase knowledge and/or ability of: coping skills   Demonstrate ability to: Increase motivation to adhere to plan of care   Progress towards Goals: Ongoing   Interventions: Interventions utilized:  CBT Cognitive Behavioral Therapy Standardized Assessments completed: PHQ-SADS     Patient and/or Family Response: Patient agrees to services   Patient Centered Plan: Patient is on the following Treatment Plan(s): OPT Assessment: Patient currently experiencing Anger, Depression .    Patient may benefit from OPT.   Plan: Follow up with behavioral health clinician on : April 24; In-person    Christen Butter, MSW, LCSW-A She/Her Behavioral Health Clinician [facility]  Internal Medicine Center Direct Dial:[phone]  Fax [phone] Main Office Phone: [phone] [address] [place] [place], [place] [address] Website: [facility] Internal Medicine [facility]  [place], [place]  [facility]

## 2023-10-25 NOTE — BH Specialist Note (Deleted)
 write a character letter for Bailey Rose to change discharge status from dishonorable to honorable. Structure: A good character letter typically includes:  Introduction: Briefly state who you are Bailey how you know the applicant. Personal Qualities: Highlight positive traits, service contributions, Bailey any relevant skills or achievements. Examples: Provide specific examples to illustrate your points, demonstrating the applicant's character Bailey service record. Conclusion: Briefly state your belief that the applicant deserves a discharge upgrade. Christen Butter, LCSW-A Bailey behavioral health specialist with [facility] has been [name] therapist for over a year. Its my opinion [name] was suffering from PTSD at her time of dishonorable discharge. [name] reported signicant life circumstances that impacted her mental health Bailey intensified symptoms of her PTSD at the time she was in services. Her injuries, trauma triggers that included carrying laundry, standing for more than 4-5 hrs at attention Bailey on watch, sleeping on metal bunk beds with thin mattresses. Additional, Ms. Creary became injured while taking a running test which intensified her PTSD symptoms warranting her to shut down, emotionally break down Bailey be in fear . Ms. Creary at that time couldnt identify that these situations were triggering her PTSD which resulted in her not being able to complete testing Bailey being dishonrable dischared. [name] has been a patient of mine since 08/30/2022. Since , she has shown tremendous growth in understanding her PTSD Bailey processing the incident. Although she still suffers with PTSD, she has shown significant growth in handling her symptoms with minimum to none emotional reactions.  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 She meets by weekly for therapy. [name] shows remorse for  how she was discharge because she was ready Bailey prepared for the [employer]. Recruiters applauded her for her strategic skills Bailey mindset. 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 SHe recalled becoming injured during her exam Bailey feeling that staff wasn't helping which triggered her PTSD. [name] is still dealing with her PTSD symptoms which impacts her ability to work, but she has improved significantly in using coping skills to prevent emotional dysregulation.

## 2023-10-25 NOTE — Patient Instructions (Addendum)
 [name]

## 2023-10-30 ENCOUNTER — Encounter: Payer: Self-pay | Admitting: Obstetrics

## 2023-10-30 ENCOUNTER — Other Ambulatory Visit (HOSPITAL_COMMUNITY)
Admission: RE | Admit: 2023-10-30 | Discharge: 2023-10-30 | Disposition: A | Discharge status: Home / Self Care | Source: Ambulatory Visit | Attending: Obstetrics | Admitting: Obstetrics

## 2023-10-30 ENCOUNTER — Ambulatory Visit: Payer: Medicaid Other | Admitting: Obstetrics

## 2023-10-30 VITALS — BP 117/78 | HR 78 | Ht 59.0 in | Wt 170.0 lb

## 2023-10-30 DIAGNOSIS — Z01419 Encounter for gynecological examination (general) (routine) without abnormal findings: Secondary | ICD-10-CM | POA: Insufficient documentation

## 2023-10-30 DIAGNOSIS — E569 Vitamin deficiency, unspecified: Secondary | ICD-10-CM | POA: Diagnosis not present

## 2023-10-30 DIAGNOSIS — R399 Unspecified symptoms and signs involving the genitourinary system: Secondary | ICD-10-CM | POA: Diagnosis not present

## 2023-10-30 DIAGNOSIS — Z720 Tobacco use: Secondary | ICD-10-CM

## 2023-10-30 DIAGNOSIS — Z3009 Encounter for other general counseling and advice on contraception: Secondary | ICD-10-CM | POA: Diagnosis not present

## 2023-10-30 DIAGNOSIS — E66811 Obesity, class 1: Secondary | ICD-10-CM

## 2023-10-30 LAB — POCT URINALYSIS DIPSTICK
Bilirubin, UA: NEGATIVE
Blood, UA: NEGATIVE
Glucose, UA: NEGATIVE
Ketones, UA: NEGATIVE
Leukocytes, UA: NEGATIVE
Nitrite, UA: NEGATIVE
Protein, UA: NEGATIVE
Spec Grav, UA: 1.015 (ref 1.010–1.025)
Urobilinogen, UA: 0.2 U/dL — AB
pH, UA: 6 (ref 5.0–8.0)

## 2023-10-30 MED ORDER — VITAFOL ULTRA 29-0.6-0.4-200 MG PO CAPS: 1.0000 | ORAL_CAPSULE | Freq: Every day | ORAL | 4 refills | Status: DC

## 2023-10-30 NOTE — Progress Notes (Signed)
 Declines STD testing today. See counselor for therapy. Wants to stay with condoms for [facility]. C/O left adnexal pain. Thinks bladder infection.

## 2023-10-30 NOTE — Progress Notes (Signed)
 Subjective:        Bailey Rose is a 28 y.o. female here for a routine exam.  Current complaints: None.    Personal health questionnaire:  Is patient [name], have a family history of breast and/or ovarian cancer: yes Is there a family history of uterine cancer diagnosed at age < 82, gastrointestinal cancer, urinary tract cancer, family member who is a Personnel officer syndrome-associated carrier: no Is the patient overweight and hypertensive, family history of diabetes, personal history of gestational diabetes, preeclampsia or PCOS: no Is patient over 24, have PCOS,  family history of premature CHD under age 42, diabetes, smoke, have hypertension or peripheral artery disease:  no At any time, has a partner hit, kicked or otherwise hurt or frightened you?: no Over the past 2 weeks, have you felt down, depressed or hopeless?: no Over the past 2 weeks, have you felt little interest or pleasure in doing things?:no   Gynecologic History Patient's last menstrual period was 10/10/2023 (exact date). Contraception: condoms Last Pap: 2021. Results were: normal Last mammogram: n/a. Results were: n/a  Obstetric History OB History  Gravida Para Term Preterm AB Living  3 0 0 0 2 0  SAB IAB Ectopic Multiple Live Births  2 0 0 0 0    # Outcome Date GA Lbr Len/2nd Weight Sex Type Anes PTL Lv  3 Gravida           2 SAB 03/2019 [redacted]w[redacted]d         1 SAB             Past Medical History:  Diagnosis Date   Acne cystica 02/10/2015   Asthma    Delayed gastric emptying 01/08/2020   Related to progesterone effect of pregnancy  Rx Reglan 5mg  q8h prn   Dysmenorrhea 06/24/2013   Migraines    Polycystic ovarian syndrome    Premenstrual syndrome 05/01/2021   Sickle cell trait (HCC)     Past Surgical History:  Procedure Laterality Date   TONSILLECTOMY     WISDOM TOOTH EXTRACTION  2017     Current Outpatient Medications:    levETIRAcetam (KEPPRA) 500 MG tablet, TAKE 1 TABLET BY MOUTH TWICE A  DAY, Disp: 60 tablet, Rfl: 5   Prenat-Fe Poly-Methfol-FA-DHA (VITAFOL ULTRA) 29-0.6-0.4-200 MG CAPS, Take 1 capsule by mouth daily before breakfast., Disp: 34 each, Rfl: 4   acetaminophen (TYLENOL) 325 MG tablet, Take 2 tablets (650 mg total) by mouth every 6 (six) hours as needed for mild pain (or Fever >/= 101). (Patient not taking: Reported on 09/18/2023), Disp: , Rfl:    ondansetron (ZOFRAN-ODT) 4 MG disintegrating tablet, 4mg  ODT q4 hours prn nausea/vomit (Patient not taking: Reported on 09/18/2023), Disp: 10 tablet, Rfl: 0   Probiotic Product (PROBIOTIC-10 PO), Take by mouth. (Patient not taking: Reported on 10/30/2023), Disp: , Rfl:  Allergies  Allergen Reactions   Latex Itching    Reports itching and irritation with condom use.    Social History   Tobacco Use   Smoking status: Some Days    Current packs/day: 0.25    Average packs/day: 0.3 packs/day for 2.0 years (0.5 ttl pk-yrs)    Types: Cigarettes   Smokeless tobacco: Never   Tobacco comments:    Smoking 3-4 cigs per day  Substance Use Topics   Alcohol use: Not Currently    Comment: States socially/ while on vacation    Family History  Problem Relation Age of Onset   Sickle cell anemia Mother  Breast cancer Mother    Cancer Maternal Grandmother        lung   Migraines Maternal Grandmother    Lupus Maternal Grandmother       Review of Systems  Constitutional: negative for fatigue and weight loss Respiratory: negative for cough and wheezing Cardiovascular: negative for chest pain, fatigue and palpitations Gastrointestinal: negative for abdominal pain and change in bowel habits Musculoskeletal:negative for myalgias Neurological: negative for gait problems and tremors Behavioral/Psych: negative for abusive relationship, depression Endocrine: negative for temperature intolerance    Genitourinary:negative for abnormal menstrual periods, genital lesions, hot flashes, sexual problems and vaginal  discharge Integument/breast: negative for breast lump, breast tenderness, nipple discharge and skin lesion(s)    Objective:       BP 117/78   Pulse 78   Ht 4\' 11"  (1.499 m)   Wt 170 lb (77.1 kg)   LMP 10/10/2023 (Exact Date)   BMI 34.34 kg/m  General:   Alert and no distress  Skin:   no rash or abnormalities  Lungs:   clear to auscultation bilaterally  Heart:   regular rate and rhythm, S1, S2 normal, no murmur, click, rub or gallop  Breasts:   normal without suspicious masses, skin or nipple changes or axillary nodes  Abdomen:  normal findings: no organomegaly, soft, non-tender and no hernia  Pelvis:  External genitalia: normal general appearance Urinary system: urethral meatus normal and bladder without fullness, nontender Vaginal: normal without tenderness, induration or masses Cervix: normal appearance Adnexa: normal bimanual exam Uterus: anteverted and non-tender, normal size   Lab Review Urine pregnancy test Labs reviewed yes Radiologic studies reviewed no  I have spent a total of 20 minutes of face-to-face time, excluding clinical staff time, reviewing notes and preparing to see patient, ordering tests and/or medications, and counseling the patient.   Assessment:    1. Encounter for gynecological examination with Papanicolaou smear of cervix (Primary) Rx: - Cytology - PAP( [facility])  2. UTI symptoms Rx: - POCT Urinalysis Dipstick:  Negative  3. Encounter for counseling regarding contraception - using condoms.  Declines hormonal birth control  4. Obesity (BMI 30.0-34.9) - weight reduction with the aid of dietary changes, exercise and behavioral modification recommended  5. Tobacco abuse, episodic - cessation recommended  6. Vitamin deficiency Rx: - Prenat-Fe Poly-Methfol-FA-DHA (VITAFOL ULTRA) 29-0.6-0.4-200 MG CAPS; Take 1 capsule by mouth daily before breakfast.  Dispense: 34 each; Refill: 4      Plan:    Education reviewed: calcium supplements,  depression evaluation, low fat, low cholesterol diet, safe sex/STD prevention, self breast exams, smoking cessation, and weight bearing exercise. Contraception: condoms. Follow up in: 1 year.   Meds ordered this encounter  Medications   Prenat-Fe Poly-Methfol-FA-DHA (VITAFOL ULTRA) 29-0.6-0.4-200 MG CAPS    Sig: Take 1 capsule by mouth daily before breakfast.    Dispense:  34 each    Refill:  4   Orders Placed This Encounter  Procedures   POCT Urinalysis Dipstick    Gabrielle Joiner, MD, FACOG Attending Obstetrician & Gynecologist, [facility] for [facility], [facility], [place] 10/30/2023

## 2023-11-01 ENCOUNTER — Encounter: Payer: Self-pay | Admitting: Family Medicine

## 2023-11-01 LAB — CYTOLOGY - PAP: Diagnosis: NEGATIVE

## 2023-11-12 ENCOUNTER — Telehealth: Payer: Self-pay | Admitting: Internal Medicine

## 2023-11-12 NOTE — Telephone Encounter (Signed)
 Spoke with the patient.  Name: Bailey Rose, Bailey Rose MRN: [id number]  Date: 11/15/2023 Status: Sch  Time: 1:30 PM Length: 30  Visit Type: INTEGRATED BH FOLLOW UP [1646]      Copied from CRM [id number]. Topic: Appointments - Scheduling Inquiry for Clinic >> Nov 12, 2023 10:45 AM Brynn Caras wrote: Reason for CRM: The patient states she called in previously today to schedule a BH appointment with Amie Bald, but she was given the Radiology department number. The patient will still need to be scheduled, she is available at anytime. Callback [phone]

## 2023-11-15 ENCOUNTER — Ambulatory Visit: Admitting: Licensed Clinical Social Worker

## 2023-11-15 DIAGNOSIS — F331 Major depressive disorder, recurrent, moderate: Secondary | ICD-10-CM | POA: Diagnosis present

## 2023-11-15 NOTE — BH Specialist Note (Signed)
 Integrated Behavioral Health Follow Up In-Person Visit  MRN: [id number] Name: Bailey Rose  Number of Integrated Behavioral Health Clinician visits: Additional Visit  Session Start time: 1414   Session End time: 1514  Total time in minutes: 60   Types of Service: Individual psychotherapy   Interpretor:No. Interpretor Name and Language: N/A   Subjective: Bailey Rose is a 28 y.o. female  Patient was referred by PCP for [facility]. Patient reports the following symptoms/concerns: 2. 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  Compensation: Commission-based with daily pay cycles; potential for residual income and vested renewals.  Requirements: Must be at least 26 years old and able to pass a background check; no prior sales experience required.  Benefits: Sponsored pre-licensing course, free training program, and opportunities to grow into leadership roles.  Sales Jobs  3. Life Engineer, production - Adult nurse Group (Remote, [place], [place])  Role: Remote sales position focusing on mortgage protection, final expense, and retirement planning services.  Compensation: Commission-based with uncapped earning potential; average commission per application ranges from $450 to $650.  Requirements: [employer] (assistance provided if not licensed), self-motivated, goal-oriented, and coachable.  Benefits: Access to high-quality leads, one-on-one mentorship, and advanced technology to enhance productivity.  [employer]  4. Cytogeneticist ([place], [place])  Role: Sales of various insurance products with a focus on customer service.  Compensation: [employer] between $55,000 and $65,000 annually, plus uncapped commissions.  Requirements: Ability to learn insurance coverage and underwriting  guidelines; prior experience in insurance sales is beneficial.  Public [email or web address] +6 Job Search  [employer] Jobs +6  5. Engineer, production - [employer] ([place], [place])  Role: Sales of life insurance policies, including client consultations and policy recommendations.  Compensation: Commission-based; estimated annual earnings between $66,000 and $98,000.  Requirements: Proficiency with [employer], strong interpersonal skills, and the ability to obtain an Administrator.  Product [email or web address]  6. Entry-Level Office manager - [employer] [place], [place])  Role: Engage in needs analysis for clients to determine suitable retirement, life, and health insurance solutions.  Compensation: Details not specified; typically includes training and support for licensing.  Requirements: No prior experience required; training provided.         Objective: Mood: NA and Affect: Appropriate Risk of harm to self or others: No plan to harm self or others     Patient and/or Family's Strengths/Protective Factors: Social connections   Goals Addressed: Patient will:  Reduce symptoms of: depression and mood instability   Increase knowledge and/or ability of: coping skills   Demonstrate ability to: Increase motivation to adhere to plan of care   Progress towards Goals: Ongoing   Interventions: Interventions utilized:  CBT Cognitive Behavioral Therapy      Patient and/or Family Response: Patient agrees to services   Patient Centered Plan: Patient is on the following Treatment Plan(s): OPT Assessment: Patient currently experiencing Anger, Depression .    Patient may benefit from OPT.   Plan: Follow up with behavioral health clinician on : March 13; In-person    Amie Bald, MSW, LCSW-A She/Her Behavioral Health Clinician [facility]  Internal Medicine Center Direct [phone]  Fax [phone] Main Office Phone: [phone] [address] [place] [place], [place] [address] Website: [facility]  [place], [place]  [place]

## 2023-11-16 NOTE — Telephone Encounter (Signed)
 Pt aware of appt sch for 11/27/2023  Copied from CRM #[id number]. Topic: Appointments - Scheduling Inquiry for Clinic >> Nov 15, 2023  4:14 PM Karole Pacer C wrote: Reason for CRM: Patient would like a call back to schedule an appointment for [facility].

## 2023-11-27 ENCOUNTER — Ambulatory Visit: Admitting: Licensed Clinical Social Worker

## 2023-12-18 ENCOUNTER — Ambulatory Visit (INDEPENDENT_AMBULATORY_CARE_PROVIDER_SITE_OTHER): Admitting: Licensed Clinical Social Worker

## 2023-12-18 DIAGNOSIS — F331 Major depressive disorder, recurrent, moderate: Secondary | ICD-10-CM | POA: Diagnosis present

## 2023-12-18 NOTE — BH Specialist Note (Signed)
 Integrated Behavioral Health Follow Up In-Person Visit  MRN: [id number] Name: Bailey Rose  Number of Integrated Behavioral Health Clinician visits: Additional Visit  Session Start time: 1330   Session End time: 1430  Total time in minutes: 60    Types of Service: Individual psychotherapy and General Behavioral Integrated Care (BHI)  Interpretor:No. Interpretor Name and Language: N/A  Subjective: Bailey Rose is a 28 y.o. female  Patient was referred by PCP  for Behavioral Health. Patient reports the following symptoms/concerns: Session was conducted in person at the office with the patient present. 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 She appeared well-groomed, alert, and in positive spirits. During the session, patient shared reflections from a recent trip to [place] , emphasizing the sense of peace she experienced during her time away. She continues to explore and implement strategies to address family conflict, particularly focusing on setting and maintaining personal boundaries to avoid re-engaging in caregiving roles that have previously caused emotional and mental strain.  Patient has demonstrated notable progress in managing anger and navigating life stressors, utilizing healthier coping mechanisms. She expressed a desire to maintain this progress by focusing on her personal development and working toward obtaining independent housing. [facility] introduced the topic of student housing options, and patient expressed interest. She agreed to further research and familiarize herself with the eligibility and application process. Plan is to continue supporting patient's goals for autonomy, emotional regulation, and stability through ongoing therapeutic engagement.  Coping skill for patient: Grounding techniques, specifically the 5-4-3-2-1 method, to help the patient manage emotional overwhelm when faced with  family-related stressors. This coping skill reinforce the patient's progress in anger management and to support her in maintaining emotional clarity and control when navigating boundary-setting with loved ones.   Duration of problem: More than 12 months; Severity of problem: moderate  Objective: Mood: Depressed and Affect: Tearful Risk of harm to self or others: No plan to harm self or others  Life Context: Family and Social: Patient has extended family. School/Work: Patient has started new job training.  Self-Care: Patient will continue developing self care activities.  Life Changes: Patient continues adjusting to new dog.   Patient and/or Family's Strengths/Protective Factors: Sense of purpose  Goals Addressed: Patient will:  Reduce symptoms of: depression   Increase knowledge and/or ability of: coping skills   Demonstrate ability to: Increase healthy adjustment to current life circumstances  Progress towards Goals: Ongoing  Interventions: Interventions utilized:  Mindfulness or Management consultant and CBT Cognitive Behavioral Therapy Standardized Assessments completed: Not Needed      Patient and/or Family Response: Patient agrees to ongoing counseling.   Patient Centered Plan: Patient is on the following Treatment Plan(s): Bi-weekly individual counseling sessions.   Clinical Assessment/Diagnosis  Moderate episode of recurrent major depressive disorder [facility])    Assessment: Patient currently experiencing Depression and Anger management .   Patient may benefit from Individual Counseling.  Plan: Follow up with behavioral health clinician on : 06/23 ; In-person Behavioral recommendations: Mindfulness techniques   Amie Bald, MSW, LCSW-A She/Her Behavioral Health Clinician [facility]  Internal Medicine Center Direct Dial:[phone]  Fax [phone]

## 2023-12-31 ENCOUNTER — Encounter: Payer: Self-pay | Admitting: *Deleted

## 2024-01-07 ENCOUNTER — Ambulatory Visit (INDEPENDENT_AMBULATORY_CARE_PROVIDER_SITE_OTHER): Admitting: Licensed Clinical Social Worker

## 2024-01-07 DIAGNOSIS — F331 Major depressive disorder, recurrent, moderate: Secondary | ICD-10-CM

## 2024-01-07 NOTE — BH Specialist Note (Signed)
 Integrated Behavioral Health Follow Up In-Person Visit  MRN: [id number] Name: Bailey Rose  Number of Integrated Behavioral Health Clinician visits: Additional Visit  Session Start time: 1430   Session End time: 1530  Total time in minutes: 60    Types of Service: Individual psychotherapy and General Behavioral Integrated Care (BHI)   Interpretor:No. Interpretor Name and Language: N/A   Subjective: Bailey Rose is a 28 y.o. female  Patient was referred by PCP   for Behavioral Health. Patient reports the following symptoms/concerns: Session was conducted in person at the office with the patient present. 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 She appeared well-groomed, alert, and in positive spirits. During the session, patient shared reflections from a recent trip to [place] , emphasizing the sense of peace she experienced during her time away. She continues to explore and implement strategies to address family conflict, particularly focusing on setting and maintaining personal boundaries to avoid re-engaging in caregiving roles that have previously caused emotional and mental strain.  Patient has demonstrated notable progress in managing anger and navigating life stressors, utilizing healthier coping mechanisms. She expressed a desire to maintain this progress by focusing on her personal development and working toward obtaining independent housing. [facility] introduced the topic of student housing options, and patient expressed interest. She agreed to further research and familiarize herself with the eligibility and application process. Plan is to continue supporting patient's goals for autonomy, emotional regulation, and stability through ongoing therapeutic engagement.   Coping skill for patient: Grounding techniques, specifically the 5-4-3-2-1 method, to help the patient manage emotional overwhelm when faced with  family-related stressors. This coping skill reinforce the patient's progress in anger management and to support her in maintaining emotional clarity and control when navigating boundary-setting with loved ones.    Duration of problem: More than 12 months; Severity of problem: moderate   Objective: Mood: Depressed and Affect: Tearful Risk of harm to self or others: No plan to harm self or others   Life Context: Family and Social: Patient has extended family. School/Work: Patient has started new job training.  Self-Care: Patient will continue developing self care activities.  Life Changes: Patient continues adjusting to new dog.    Patient and/or Family's Strengths/Protective Factors: Sense of purpose   Goals Addressed: Patient will:  Reduce symptoms of: depression   Increase knowledge and/or ability of: coping skills   Demonstrate ability to: Increase healthy adjustment to current life circumstances   Progress towards Goals: Ongoing   Interventions: Interventions utilized:  Mindfulness or Management consultant and CBT Cognitive Behavioral Therapy Standardized Assessments completed: Not Needed           Patient and/or Family Response: Patient agrees to ongoing counseling.    Patient Centered Plan: Patient is on the following Treatment Plan(s): Bi-weekly individual counseling sessions.    Clinical Assessment/Diagnosis   Moderate episode of recurrent major depressive disorder [facility])      Assessment: Patient currently experiencing Depression and Anger management .    Patient may benefit from Individual Counseling.   Plan: Follow up with behavioral health clinician on : 06/23 ; In-person Behavioral recommendations: Mindfulness techniques     Renda Pontes, MSW, LCSW-A She/Her Behavioral Health Clinician [facility]  Internal Medicine Center Direct [phone]  Fax [phone]

## 2024-01-24 ENCOUNTER — Ambulatory Visit: Admitting: Obstetrics

## 2024-01-24 ENCOUNTER — Encounter: Payer: Self-pay | Admitting: Obstetrics

## 2024-01-24 ENCOUNTER — Other Ambulatory Visit (HOSPITAL_COMMUNITY)
Admission: RE | Admit: 2024-01-24 | Discharge: 2024-01-24 | Disposition: A | Discharge status: Home / Self Care | Source: Ambulatory Visit | Attending: Obstetrics | Admitting: Obstetrics

## 2024-01-24 VITALS — BP 117/79 | HR 82 | Ht 59.0 in | Wt 167.1 lb

## 2024-01-24 DIAGNOSIS — E66811 Obesity, class 1: Secondary | ICD-10-CM

## 2024-01-24 DIAGNOSIS — N898 Other specified noninflammatory disorders of vagina: Secondary | ICD-10-CM

## 2024-01-24 DIAGNOSIS — Z8742 Personal history of other diseases of the female genital tract: Secondary | ICD-10-CM | POA: Diagnosis not present

## 2024-01-24 DIAGNOSIS — N946 Dysmenorrhea, unspecified: Secondary | ICD-10-CM

## 2024-01-24 MED ORDER — IBUPROFEN 800 MG PO TABS
800.0000 mg | ORAL_TABLET | Freq: Three times a day (TID) | ORAL | 5 refills | Status: AC | PRN
Start: 2024-01-24 — End: ?

## 2024-01-24 NOTE — Progress Notes (Signed)
 Patient ID: Bailey Rose, female   DOB: [birth year], 29 y.o.   MRN: [id number]  Chief Complaint  Patient presents with   Menstrual Problem    HPI Bailey Rose is a 28 y.o. female.  Complains of irregular menstrual cycles, vaginal discharge and vaginal irritation.  Gyn history is significant for PCOS and PMS.  Past medical history is significant for migraines and asthma. HPI  Past Medical History:  Diagnosis Date   Acne cystica 02/10/2015   Asthma    Delayed gastric emptying 01/08/2020   Related to progesterone  effect of pregnancy  Rx Reglan  5mg  q8h prn   Dysmenorrhea 06/24/2013   Migraines    Polycystic ovarian syndrome    Premenstrual syndrome 05/01/2021   Sickle cell trait (HCC)     Past Surgical History:  Procedure Laterality Date   TONSILLECTOMY     WISDOM TOOTH EXTRACTION  2017    Family History  Problem Relation Age of Onset   Sickle cell anemia Mother    Breast cancer Mother    Cancer Maternal Grandmother        lung   Migraines Maternal Grandmother    Lupus Maternal Grandmother     Social History Social History   Tobacco Use   Smoking status: Some Days    Current packs/day: 0.25    Average packs/day: 0.3 packs/day for 2.0 years (0.5 ttl pk-yrs)    Types: Cigarettes   Smokeless tobacco: Never   Tobacco comments:    Smoking 3-4 cigs per day  Vaping Use   Vaping status: Former  Substance Use Topics   Alcohol use: Not Currently    Comment: States socially/ while on vacation   Drug use: Not Currently    Types: Marijuana    Allergies  Allergen Reactions   Latex Itching    Reports itching and irritation with condom use.    Current Outpatient Medications  Medication Sig Dispense Refill   ibuprofen  (ADVIL ) 800 MG tablet Take 1 tablet (800 mg total) by mouth every 8 (eight) hours as needed. 30 tablet 5   levETIRAcetam  (KEPPRA ) 500 MG tablet TAKE 1 TABLET BY MOUTH TWICE A DAY 60 tablet 5   acetaminophen  (TYLENOL ) 325 MG tablet Take 2 tablets (650  mg total) by mouth every 6 (six) hours as needed for mild pain (or Fever >/= 101). (Patient not taking: Reported on 01/24/2024)     ondansetron  (ZOFRAN -ODT) 4 MG disintegrating tablet 4mg  ODT q4 hours prn nausea/vomit (Patient not taking: Reported on 01/24/2024) 10 tablet 0   Prenat-Fe Poly-Methfol-FA-DHA (VITAFOL  ULTRA) 29-0.6-0.4-200 MG CAPS Take 1 capsule by mouth daily before breakfast. (Patient not taking: Reported on 01/24/2024) 34 each 4   Probiotic Product (PROBIOTIC-10 PO) Take by mouth. (Patient not taking: Reported on 01/24/2024)     No current facility-administered medications for this visit.    Review of Systems Review of Systems Constitutional: negative for fatigue and weight loss Respiratory: negative for cough and wheezing Cardiovascular: negative for chest pain, fatigue and palpitations Gastrointestinal: negative for abdominal pain and change in bowel habits Genitourinary: positive for irregular menstrual cycles, vaginal discharge and vaginal irritation Integument/breast: negative for nipple discharge Musculoskeletal:negative for myalgias Neurological: negative for gait problems and tremors Behavioral/Psych: negative for abusive relationship, depression Endocrine: negative for temperature intolerance      Blood pressure 117/79, pulse 82, height 4' 11 (1.499 m), weight 167 lb 1.6 oz (75.8 kg), last menstrual period 01/08/2024.  Physical Exam Physical Exam General:   Alert and no distress  Skin:  no rash or abnormalities  Lungs:   clear to auscultation bilaterally  Heart:   regular rate and rhythm, S1, S2 normal, no murmur, click, rub or gallop  Breasts:   normal without suspicious masses, skin or nipple changes or axillary nodes  Abdomen:  normal findings: no organomegaly, soft, non-tender and no hernia  Pelvis:  External genitalia: normal general appearance Urinary system: urethral meatus normal and bladder without fullness, nontender Vaginal: normal without  tenderness, induration or masses Cervix: normal appearance Adnexa: normal bimanual exam Uterus: anteverted and non-tender, normal size    I have spent a total of 20 minutes of face-to-face time, excluding clinical staff time, reviewing notes and preparing to see patient, ordering tests and/or medications, and counseling the patient.    Data Reviewed Wet Prep  Assessment     1. History of irregular menstrual cycles (Primary) Rx: - US  PELVIC COMPLETE WITH TRANSVAGINAL; Future  2. Dysmenorrhea Rx: - ibuprofen  (ADVIL ) 800 MG tablet; Take 1 tablet (800 mg total) by mouth every 8 (eight) hours as needed.  Dispense: 30 tablet; Refill: 5  3. Vaginal discharge Rx: - Cervicovaginal ancillary only  4. Vaginal irritation  5. Obesity (BMI 30.0-34.9) - weight reduction with the aid of dietary changes, exercise and behavioral modification recommended     Plan   Follow up in 2-3 weeks  Orders Placed This Encounter  Procedures   US  PELVIC COMPLETE WITH TRANSVAGINAL    Standing Status:   Future    Expiration Date:   01/23/2025    Reason for Exam (SYMPTOM  OR DIAGNOSIS REQUIRED):   Irregular periods.    Preferred imaging location?:   [facility] Ultrasound   Meds ordered this encounter  Medications   ibuprofen  (ADVIL ) 800 MG tablet    Sig: Take 1 tablet (800 mg total) by mouth every 8 (eight) hours as needed.    Dispense:  30 tablet    Refill:  5      CARLIN RONAL CENTERS, MD, FACOG Attending Obstetrician & Gynecologist, [facility] for [facility], [facility], [place] 01/24/2024

## 2024-01-24 NOTE — Progress Notes (Signed)
 Pt presents for irregular cycles.

## 2024-01-25 LAB — CERVICOVAGINAL ANCILLARY ONLY
Bacterial Vaginitis (gardnerella): NEGATIVE
Candida Glabrata: NEGATIVE
Candida Vaginitis: NEGATIVE
Chlamydia: NEGATIVE
Comment: NEGATIVE
Comment: NEGATIVE
Comment: NEGATIVE
Comment: NEGATIVE
Comment: NEGATIVE
Comment: NORMAL
Neisseria Gonorrhea: NEGATIVE
Trichomonas: NEGATIVE

## 2024-01-28 ENCOUNTER — Ambulatory Visit (HOSPITAL_COMMUNITY)
Admission: RE | Admit: 2024-01-28 | Discharge: 2024-01-28 | Disposition: A | Discharge status: Home / Self Care | Source: Ambulatory Visit | Attending: Obstetrics | Admitting: Obstetrics

## 2024-01-28 ENCOUNTER — Ambulatory Visit: Admitting: Licensed Clinical Social Worker

## 2024-01-28 DIAGNOSIS — N926 Irregular menstruation, unspecified: Secondary | ICD-10-CM | POA: Diagnosis not present

## 2024-01-28 DIAGNOSIS — Z8742 Personal history of other diseases of the female genital tract: Secondary | ICD-10-CM | POA: Diagnosis present

## 2024-01-30 ENCOUNTER — Ambulatory Visit (INDEPENDENT_AMBULATORY_CARE_PROVIDER_SITE_OTHER): Admitting: Licensed Clinical Social Worker

## 2024-01-30 DIAGNOSIS — F331 Major depressive disorder, recurrent, moderate: Secondary | ICD-10-CM | POA: Diagnosis not present

## 2024-01-30 NOTE — BH Specialist Note (Signed)
 Integrated Behavioral Health via Telemedicine Visit  02/07/2024 Bailey Rose [id number]  Number of Integrated Behavioral Health Clinician visits: Additional Visit  Session Start time: 1330   Session End time: 1545  Total time in minutes: 15    Referring Provider: PCP Patient/Family location: Home Bailey Rose Provider location: [place] All persons participating in visit: Bailey Rose Bailey Rose and Patient Types of Service: Telephone visit  I connected with Bailey Rose via  Telephone or Video Enabled Telemedicine Application  (Video is Caregility application) and verified that I am speaking with the correct person using two identifiers. Discussed confidentiality: Yes   I discussed the limitations of telemedicine and the availability of in person appointments.  Discussed there is a possibility of technology failure and discussed alternative modes of communication if that failure occurs.  I discussed that engaging in this telemedicine visit, they consent to the provision of behavioral healthcare and the services will be billed under their insurance.  Patient and/or legal guardian expressed understanding and consented to Telemedicine visit: Yes   Presenting Concerns: Patient and/or family reports the following symptoms/concerns: Bailey Rose conducted a brief telephone visit. Patient requested in-person appointment. Bailey Rose scheduled patient for following day. Nothing additional discussed during encounter.   Clinical Assessment/Diagnosis  Moderate episode of recurrent major depressive disorder [facility])    Assessment: Patient currently experiencing Depression.   Patient may benefit from Ongoing counseling.  Plan: Follow up with behavioral health clinician on : 01/31/24; In-person  I discussed the assessment and treatment plan with the patient and/or parent/guardian. They were provided an opportunity to ask questions and all were answered. They agreed with the plan and demonstrated an understanding of the  instructions.   They were advised to call back or seek an in-person evaluation if the symptoms worsen or if the condition fails to improve as anticipated.  Renda Pontes, MSW, LCSW-A She/Her Behavioral Health Clinician [facility]  Internal [facility]

## 2024-01-31 ENCOUNTER — Ambulatory Visit: Admitting: Licensed Clinical Social Worker

## 2024-01-31 DIAGNOSIS — F418 Other specified anxiety disorders: Secondary | ICD-10-CM | POA: Diagnosis present

## 2024-01-31 NOTE — BH Specialist Note (Signed)
 Integrated Behavioral Health Follow Up In-Person Visit  MRN: [id number] Name: Bailey Rose  Number of Integrated Behavioral Health Clinician visits: Additional Visit  Session Start time: 1330   Session End time: 1430  Total time in minutes: 60    Types of Service: Individual psychotherapy and Health & Behavioral Assessment/Intervention  Interpretor:No. Interpretor Name and Language: N/A  Subjective: Bailey Rose is a 28 y.o. female  Patient was referred by PCP for Counseling. Patient reports the following symptoms/concerns: Session held today with patient. Focus was on a recent major incident in the patient's neighborhood, which mirrored previous conflicts. Patient reflected on her role in the situation and explored patterns of involvement in others' crises. Discussed strategies to recognize when to disengage and prioritize personal well-being. Identified skills including emotional boundaries, pausing before reacting, and redirecting energy toward self-care. Patient acknowledged the need to stop trying to save everyone and expressed motivation to practice stepping back and focusing on herself.  Patient will reflect on the following; Journal Topics: "Why do I feel responsible for fixing others' problems?" Explore where this belief may come from and how it affects your emotional well-being.  "What does it look like when I put myself first?" Imagine a life where your needs are the priority--describe what would change.  "Warning signs: When I'm too emotionally involved in someone else's chaos." Identify the physical or emotional signals that tell you you're doing too much.  "Who am I outside of being a helper?" Reflect on your identity beyond the role of supporting others.  "A time I stepped back--and the world didn't fall apart." Write about a time you set a boundary and what happened next.  "What does peace mean to me?" Visualize your ideal version of inner peace and  what it takes to maintain it.  "I forgive myself for." Release guilt over past choices where you may have overextended yourself.  "Is it my problem or their problem?" Practice differentiating between what's yours to carry and what isn't.  "What am I avoiding in my own life by focusing on others?" Explore whether rescuing others serves as a distraction from personal needs.  "What small step can I take this week to care for myself?" Set a realistic, intentional self-care goal and reflect on its impact.  Duration of problem: More than a year; Severity of problem: moderate  Objective: Mood: NA and Affect: Appropriate Risk of harm to self or others: No plan to harm self or others  Life Context: Family and Social: Patient resides with biological mother and acts as a caregiver School/Work: Employed Self-Care: Ongoing Life Changes: Decreased smoking  Patient and/or Family's Strengths/Protective Factors: Sense of purpose  Goals Addressed: Patient will:  Reduce symptoms of: anxiety    Progress towards Goals: Ongoing  Interventions: Interventions utilized:  CBT Cognitive Behavioral Therapy Standardized Assessments completed: Patient declined screening      Patient and/or Family Response: Patient agrees to wanting self improvement. Patient agrees to ongoing individual therapy.  Patient Centered Plan: Patient is on the following Treatment Plan(s): Bi- weekly sessions with therapist  Clinical Assessment/Diagnosis  Depression with anxiety    Assessment: Patient currently experiencing Depression and Anxiety.   Patient may benefit from Ongoing counseling.  Plan: Follow up with behavioral health clinician on : Within two weeks.  Bailey Rose, MSW, LCSW-A She/Her Behavioral Health Clinician [facility]  Internal Medicine Center

## 2024-02-12 ENCOUNTER — Ambulatory Visit: Admitting: Licensed Clinical Social Worker

## 2024-02-12 ENCOUNTER — Telehealth: Payer: Self-pay

## 2024-02-12 DIAGNOSIS — F419 Anxiety disorder, unspecified: Secondary | ICD-10-CM

## 2024-02-12 DIAGNOSIS — F32A Depression, unspecified: Secondary | ICD-10-CM

## 2024-02-12 DIAGNOSIS — F418 Other specified anxiety disorders: Secondary | ICD-10-CM

## 2024-02-12 NOTE — BH Specialist Note (Signed)
 Integrated Behavioral Health Follow Up In-Person Visit  MRN: [id number] Name: Bailey Rose  Number of Integrated Behavioral Health Clinician visits: Additional Visit  Session Start time: 1330   Session End time: 1430  Total time in minutes: 60    Types of Service: Individual psychotherapy and General Behavioral Integrated Care (BHI)  Interpretor:No. Interpretor Name and Language: N/A  Subjective: Bailey Rose is a 28 y.o. female  Patient was referred by PCP for Counseling.  Patient reports the following symptoms/concerns: Session was spent reviewing recent job denials, building resume and reflecting on life goals and past decisions. Over the next week, patient will work on each remaining section of her resume and fill in as many details as possible. Session was spent discussing self improvement. Client was engaged, coorperative and calm during session with a positive attitude. Patient has shown significant progress in self reflection and processing decisions.    Duration of problem: Less than a year; Severity of problem: moderate  Objective: Mood: NA and Affect: Appropriate Risk of harm to self or others: No plan to harm self or others  Life Context: Family and Social: N/A School/Work: N/A Self-Care: N/A Life Changes: N/A  Patient and/or Family's Strengths/Protective Factors: Sense of purpose  Goals Addressed: Patient will:  Reduce symptoms of: mood instability    Progress towards Goals: Ongoing  Interventions: Interventions utilized:  Mindfulness or Management consultant and Link to [employer] Standardized Assessments completed: Patient declined screening      Patient and/or Family Response: Patient agrees to continue services with [facility].   Patient Centered Plan: Patient is on the following Treatment Plan(s): Patient will continue with Self improvement.   Clinical Assessment/Diagnosis  No diagnosis found.    Assessment: Patient currently  experiencing Anxiety and Depression.   Patient may benefit from Ongoing individual counseling .  Plan: Follow up with behavioral health clinician on : within two weeks  Renda Pontes, MSW, LCSW-A She/Her Behavioral Health Clinician [facility]  Internal Medicine Center

## 2024-02-12 NOTE — Telephone Encounter (Signed)
 This patient is sch already for the following day and is on the sch.  Name: Bailey Rose, Deiter MRN: [id number]  Date: 02/12/2024 Status: Sch  Time: 1:30 PM Length: 60  Visit Type: INTEGRATED BH FOLLOW UP [1646] Copay: $0.00  Provider: Bennett Renda PARAS        Copied from CRM [id number]. Topic: Appointments - Scheduling Inquiry for Clinic >> Feb 11, 2024  5:16 PM Mercer PEDLAR wrote: Reason for CRM: Patient called stating that she had an appointment scheduled for tomorrow 02/12/24 for behavioral health follow up and she got a call earlier today 02/11/24 and confirmed the appointment, but now it is cancelled as well as the appointment for 02/18/24. Patient would like a callback to let her know reason for cancellation and to get back on the schedule if possible.

## 2024-02-18 ENCOUNTER — Ambulatory Visit: Admitting: Licensed Clinical Social Worker

## 2024-02-20 ENCOUNTER — Ambulatory Visit: Admitting: Obstetrics

## 2024-02-20 ENCOUNTER — Other Ambulatory Visit (HOSPITAL_COMMUNITY)
Admission: RE | Admit: 2024-02-20 | Discharge: 2024-02-20 | Disposition: A | Discharge status: Home / Self Care | Source: Ambulatory Visit | Attending: Obstetrics | Admitting: Obstetrics

## 2024-02-20 ENCOUNTER — Encounter: Payer: Self-pay | Admitting: Obstetrics

## 2024-02-20 ENCOUNTER — Other Ambulatory Visit: Payer: Self-pay | Admitting: Neurology

## 2024-02-20 VITALS — BP 117/73 | HR 64 | Ht 59.0 in | Wt 169.0 lb

## 2024-02-20 DIAGNOSIS — Z720 Tobacco use: Secondary | ICD-10-CM

## 2024-02-20 DIAGNOSIS — Z113 Encounter for screening for infections with a predominantly sexual mode of transmission: Secondary | ICD-10-CM | POA: Diagnosis not present

## 2024-02-20 DIAGNOSIS — R569 Unspecified convulsions: Secondary | ICD-10-CM

## 2024-02-20 DIAGNOSIS — N898 Other specified noninflammatory disorders of vagina: Secondary | ICD-10-CM | POA: Insufficient documentation

## 2024-02-20 DIAGNOSIS — Z8742 Personal history of other diseases of the female genital tract: Secondary | ICD-10-CM

## 2024-02-20 DIAGNOSIS — E66811 Obesity, class 1: Secondary | ICD-10-CM

## 2024-02-20 DIAGNOSIS — R300 Dysuria: Secondary | ICD-10-CM | POA: Diagnosis not present

## 2024-02-20 DIAGNOSIS — Z3009 Encounter for other general counseling and advice on contraception: Secondary | ICD-10-CM | POA: Diagnosis not present

## 2024-02-20 DIAGNOSIS — G43109 Migraine with aura, not intractable, without status migrainosus: Secondary | ICD-10-CM

## 2024-02-20 LAB — POCT URINALYSIS DIPSTICK
Bilirubin, UA: NEGATIVE
Blood, UA: NEGATIVE
Glucose, UA: NEGATIVE
Leukocytes, UA: NEGATIVE
Nitrite, UA: NEGATIVE
Protein, UA: NEGATIVE
Spec Grav, UA: 1.015 (ref 1.010–1.025)
Urobilinogen, UA: 0.2 U/dL
pH, UA: 6 (ref 5.0–8.0)

## 2024-02-20 MED ORDER — NITROFURANTOIN MONOHYD MACRO 100 MG PO CAPS: 100.0000 mg | ORAL_CAPSULE | Freq: Two times a day (BID) | ORAL | 2 refills | Status: DC

## 2024-02-20 MED ORDER — FLUCONAZOLE 150 MG PO TABS: 150.0000 mg | ORAL_TABLET | Freq: Once | ORAL | 0 refills | Status: AC

## 2024-02-20 NOTE — Progress Notes (Signed)
 Took Ibuprofen  and had dizzy, constipation,and nausea. Stopped after 3 days. Wants full STI testing today. Thinks yeast infection. Slight itching and white discharge.

## 2024-02-20 NOTE — Progress Notes (Signed)
 Patient ID: Bailey Rose, female   DOB: July 17, 1996, 28 y.o.   MRN: [id number]  Chief Complaint  Patient presents with   Follow-up    HPI Bailey Rose is a 28 y.o. female.  Has a history of irregular periods.  Uses condoms for contraception, and also occasionally takes Plan B.  She is not a candidate for hormonal contraception because of severe migraines with aura. HPI  Past Medical History:  Diagnosis Date   Acne cystica 02/10/2015   Asthma    Delayed gastric emptying 01/08/2020   Related to progesterone  effect of pregnancy  Rx Reglan  5mg  q8h prn   Dysmenorrhea 06/24/2013   Migraines    Polycystic ovarian syndrome    Premenstrual syndrome 05/01/2021   Sickle cell trait (HCC)     Past Surgical History:  Procedure Laterality Date   TONSILLECTOMY     WISDOM TOOTH EXTRACTION  2017    Family History  Problem Relation Age of Onset   Sickle cell anemia Mother    Breast cancer Mother    Cancer Maternal Grandmother        lung   Migraines Maternal Grandmother    Lupus Maternal Grandmother     Social History Social History   Tobacco Use   Smoking status: Some Days    Current packs/day: 0.25    Average packs/day: 0.3 packs/day for 2.0 years (0.5 ttl pk-yrs)    Types: Cigarettes   Smokeless tobacco: Never   Tobacco comments:    Smoking 3-4 cigs per day  Vaping Use   Vaping status: Former  Substance Use Topics   Alcohol use: Not Currently    Comment: States socially/ while on vacation   Drug use: Not Currently    Types: Marijuana    Allergies  Allergen Reactions   Latex Itching    Reports itching and irritation with condom use.    Current Outpatient Medications  Medication Sig Dispense Refill   fluconazole  (DIFLUCAN ) 150 MG tablet Take 1 tablet (150 mg total) by mouth once for 1 dose. 1 tablet 0   ibuprofen  (ADVIL ) 800 MG tablet Take 1 tablet (800 mg total) by mouth every 8 (eight) hours as needed. 30 tablet 5   levETIRAcetam  (KEPPRA ) 500 MG tablet TAKE 1  TABLET BY MOUTH TWICE A DAY 60 tablet 5   nitrofurantoin , macrocrystal-monohydrate, (MACROBID ) 100 MG capsule Take 1 capsule (100 mg total) by mouth 2 (two) times daily. 1 po BID x 7days 14 capsule 2   acetaminophen  (TYLENOL ) 325 MG tablet Take 2 tablets (650 mg total) by mouth every 6 (six) hours as needed for mild pain (or Fever >/= 101). (Patient not taking: Reported on 01/24/2024)     ondansetron  (ZOFRAN -ODT) 4 MG disintegrating tablet 4mg  ODT q4 hours prn nausea/vomit (Patient not taking: Reported on 01/24/2024) 10 tablet 0   Prenat-Fe Poly-Methfol-FA-DHA (VITAFOL  ULTRA) 29-0.6-0.4-200 MG CAPS Take 1 capsule by mouth daily before breakfast. (Patient not taking: Reported on 01/24/2024) 34 each 4   Probiotic Product (PROBIOTIC-10 PO) Take by mouth. (Patient not taking: Reported on 01/24/2024)     No current facility-administered medications for this visit.    Review of Systems Review of Systems Constitutional: negative for fatigue and weight loss Respiratory: negative for cough and wheezing Cardiovascular: negative for chest pain, fatigue and palpitations Gastrointestinal: negative for abdominal pain and change in bowel habits Genitourinary: positive for vaginal discharge and itching; and also has urinary frequency Integument/breast: negative for nipple discharge Musculoskeletal:negative for myalgias Neurological: negative for gait problems  and tremors Behavioral/Psych: negative for abusive relationship, depression Endocrine: negative for temperature intolerance      Blood pressure 117/73, pulse 64, height 4' 11 (1.499 m), weight 169 lb (76.7 kg), last menstrual period 02/06/2024.  Physical Exam Physical Exam General:   Alert and no distress  Skin:   no rash or abnormalities  Lungs:   clear to auscultation bilaterally  Heart:   regular rate and rhythm, S1, S2 normal, no murmur, click, rub or gallop  The remainder of the physical exam deferred due to the type of encounter.  I have  spent a total of 20 minutes of face-to-face time, excluding clinical staff time, reviewing notes and preparing to see patient, ordering tests and/or medications, and counseling the patient.   Data Reviewed Wet Prep and Cultures  Assessment     1. History of irregular menstrual cycles (Primary)  2. Vaginal discharge Rx: - Cervicovaginal ancillary only( [place]) - fluconazole  (DIFLUCAN ) 150 MG tablet; Take 1 tablet (150 mg total) by mouth once for 1 dose.  Dispense: 1 tablet; Refill: 0  3. Screening for STD (sexually transmitted disease) Rx: - RPR - Hepatitis C Antibody - Hepatitis B Surface AntiGEN - HIV antibody (with reflex)  4. Dysuria Rx: - POCT Urinalysis Dipstick - Urine Culture - nitrofurantoin , macrocrystal-monohydrate, (MACROBID ) 100 MG capsule; Take 1 capsule (100 mg total) by mouth 2 (two) times daily. 1 po BID x 7days  Dispense: 14 capsule; Refill: 2  5. Migraine with aura and without status migrainosus, not intractable - hormonal contraception is contraindicated, even though she needs cycle regulation  6. Seizures (HCC) - clinically stable on Keptra  7. Encounter for counseling regarding contraception - non-hormonal contraception recommended due to history of migraines with aura - the ParaGuard IUD discussed and recommended.  Brochure dispensed.  8. Obesity (BMI 30.0-34.9) - weight reduction with the aid of dietary changes, exercise and behavioral modification recommended  9. Tobacco abuse, episodic - cessation recommended     Plan   Follow up prn  Orders Placed This Encounter  Procedures   Urine Culture   RPR   Hepatitis C Antibody   Hepatitis B Surface AntiGEN   HIV antibody (with reflex)   POCT Urinalysis Dipstick   Meds ordered this encounter  Medications   nitrofurantoin , macrocrystal-monohydrate, (MACROBID ) 100 MG capsule    Sig: Take 1 capsule (100 mg total) by mouth 2 (two) times daily. 1 po BID x 7days    Dispense:  14 capsule     Refill:  2   fluconazole  (DIFLUCAN ) 150 MG tablet    Sig: Take 1 tablet (150 mg total) by mouth once for 1 dose.    Dispense:  1 tablet    Refill:  0      CARLIN RONAL CENTERS, MD, FACOG Attending Obstetrician & Gynecologist, [facility] for [facility], [facility], [place] 02/20/2024

## 2024-02-21 LAB — CERVICOVAGINAL ANCILLARY ONLY
Bacterial Vaginitis (gardnerella): NEGATIVE
Candida Glabrata: NEGATIVE
Candida Vaginitis: NEGATIVE
Chlamydia: NEGATIVE
Comment: NEGATIVE
Comment: NEGATIVE
Comment: NEGATIVE
Comment: NEGATIVE
Comment: NEGATIVE
Comment: NORMAL
Neisseria Gonorrhea: NEGATIVE
Trichomonas: NEGATIVE

## 2024-02-21 LAB — HEPATITIS B SURFACE ANTIGEN: Hepatitis B Surface Ag: NEGATIVE

## 2024-02-21 LAB — HIV ANTIBODY (ROUTINE TESTING W REFLEX): HIV Screen 4th Generation wRfx: NONREACTIVE

## 2024-02-21 LAB — HEPATITIS C ANTIBODY: Hep C Virus Ab: NONREACTIVE

## 2024-02-21 LAB — RPR: RPR Ser Ql: NONREACTIVE

## 2024-02-22 LAB — URINE CULTURE: Organism ID, Bacteria: NO GROWTH

## 2024-02-25 ENCOUNTER — Ambulatory Visit (INDEPENDENT_AMBULATORY_CARE_PROVIDER_SITE_OTHER): Admitting: Student

## 2024-02-25 ENCOUNTER — Ambulatory Visit: Admitting: Licensed Clinical Social Worker

## 2024-02-25 VITALS — BP 118/74 | HR 82 | Temp 98.5°F | Ht 59.0 in | Wt 168.0 lb

## 2024-02-25 DIAGNOSIS — F331 Major depressive disorder, recurrent, moderate: Secondary | ICD-10-CM

## 2024-02-25 DIAGNOSIS — E669 Obesity, unspecified: Secondary | ICD-10-CM

## 2024-02-25 DIAGNOSIS — R569 Unspecified convulsions: Secondary | ICD-10-CM

## 2024-02-25 DIAGNOSIS — E66811 Obesity, class 1: Secondary | ICD-10-CM

## 2024-02-25 DIAGNOSIS — Z6833 Body mass index (BMI) 33.0-33.9, adult: Secondary | ICD-10-CM | POA: Diagnosis not present

## 2024-02-25 NOTE — Assessment & Plan Note (Signed)
 Follows with neurology.  Managed with Keppra  500 mg twice daily.  Denies recent seizures.

## 2024-02-25 NOTE — Assessment & Plan Note (Signed)
 Stable. Follows with BH.  Denies SI/HI.  Is here for emotional support animal paperwork to be filled out for her apartment which I have completed today.

## 2024-02-25 NOTE — BH Specialist Note (Signed)
 Integrated Behavioral Health Follow Up In-Person Visit  MRN: [id number] Name: Bailey Rose  Number of Integrated Behavioral Health Clinician visits: Additional Visit  NO CHARGE (NC1)    Types of Service: General Behavioral Integrated Care (BHI)  Interpretor:No. Interpretor Name and Language: N/A  Subjective: Bailey Rose is a 28 y.o. female  Patient was referred by PCP for Counseling. Patient reports the following symptoms/concerns:   [facility] met with the patient in person today. She became tearful at times as she talked about ongoing conflict in her family and the strain it has placed on her relationships. She shared feeling torn about whether to continue certain relationships, both with family and friends, and admitted the uncertainty has been emotionally exhausting.  We spent most of the session focusing on self-reflection and what she truly needs in her connections with others. Together, we talked through a pros-and-cons approach, looking at what each relationship brings to her life and what it may be taking away. We discussed trust, respect, emotional support, and how each relationship affects her overall well-being. I encouraged her to give herself permission to take her time making decisions and to put her own mental and emotional health first. She was open and engaged in the process, and we agreed to continue exploring this in future sessions. Duration of problem: More than a year; Severity of problem: mild  Objective: Mood: Depressed and Affect: Tearful Risk of harm to self or others: No plan to harm self or others  Life Context: Family and Social: Patient reports having some family and friends. 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  Patient Centered Plan: Patient is on the following Treatment Plan(s): Bi-Weekly sessions with counselor   Clinical Assessment/Diagnosis  Moderate episode of recurrent major depressive disorder [facility])    Assessment: Patient currently experiencing Depression.   Patient may benefit from Outpatient counseling .  Plan: Follow up with behavioral health clinician on : 09/02; In-person Behavioral recommendations: Daily reflection time - Set aside 10-15 minutes each day to journal thoughts and feelings about relationships, noting patterns of support versus stress.  Renda Pontes, MSW, LCSW-A She/Her Behavioral Health Clinician [facility]  Internal Medicine Center

## 2024-02-25 NOTE — Assessment & Plan Note (Signed)
 Desires to continue working on lifestyle modifications.  Can discuss options if weight loss does not improve at follow-up.

## 2024-02-25 NOTE — Progress Notes (Signed)
 CC: ESA paperwork  HPI:  [name] is a 28 y.o. female living with a history stated below and presents today for follow-up. Please see problem based assessment and plan for additional details.  Past Medical History:  Diagnosis Date   Acne cystica 02/10/2015   Asthma    Delayed gastric emptying 01/08/2020   Related to progesterone  effect of pregnancy  Rx Reglan  5mg  q8h prn   Dysmenorrhea 06/24/2013   Migraines    Polycystic ovarian syndrome    Premenstrual syndrome 05/01/2021   Sickle cell trait (HCC)     Current Outpatient Medications on File Prior to Visit  Medication Sig Dispense Refill   acetaminophen  (TYLENOL ) 325 MG tablet Take 2 tablets (650 mg total) by mouth every 6 (six) hours as needed for mild pain (or Fever >/= 101). (Patient not taking: Reported on 01/24/2024)     ibuprofen  (ADVIL ) 800 MG tablet Take 1 tablet (800 mg total) by mouth every 8 (eight) hours as needed. 30 tablet 5   levETIRAcetam  (KEPPRA ) 500 MG tablet TAKE 1 TABLET BY MOUTH TWICE A DAY 60 tablet 5   nitrofurantoin , macrocrystal-monohydrate, (MACROBID ) 100 MG capsule Take 1 capsule (100 mg total) by mouth 2 (two) times daily. 1 po BID x 7days 14 capsule 2   Prenat-Fe Poly-Methfol-FA-DHA (VITAFOL  ULTRA) 29-0.6-0.4-200 MG CAPS Take 1 capsule by mouth daily before breakfast. (Patient not taking: Reported on 01/24/2024) 34 each 4   Probiotic Product (PROBIOTIC-10 PO) Take by mouth. (Patient not taking: Reported on 01/24/2024)     No current facility-administered medications on file prior to visit.    Family History  Problem Relation Age of Onset   Sickle cell anemia Mother    Breast cancer Mother    Cancer Maternal Grandmother        lung   Migraines Maternal Grandmother    Lupus Maternal Grandmother     Social History   Socioeconomic History   Marital status: Single    Spouse name: Not on file   Number of children: Not on file   Years of education: Not on file   Highest education level:  Not on file  Occupational History   Not on file  Tobacco Use   Smoking status: Some Days    Current packs/day: 0.25    Average packs/day: 0.3 packs/day for 2.0 years (0.5 ttl pk-yrs)    Types: Cigarettes   Smokeless tobacco: Never   Tobacco comments:    Smoking 3-4 cigs per day  Vaping Use   Vaping status: Former  Substance and Sexual Activity   Alcohol use: Not Currently    Comment: States socially/ while on vacation   Drug use: Not Currently    Types: Marijuana   Sexual activity: Yes    Partners: Male    Comment: Non-latex condoms  Other Topics Concern   Not on file  Social History Narrative   Not on file   Social Drivers of Health   Financial Resource Strain: Low Risk  (07/27/2022)   Overall Financial Resource Strain (CARDIA)    Difficulty of Paying Living Expenses: Not very hard  Food Insecurity: Food Insecurity Present (07/27/2022)   Hunger Vital Sign    Worried About Running Out of Food in the Last Year: Sometimes true    Ran Out of Food in the Last Year: Sometimes true  Transportation Needs: Unmet Transportation Needs (10/30/2023)   PRAPARE - Administrator, Civil Service (Medical): Yes    Lack of Transportation (Non-Medical):  Not on file  Physical Activity: Not on file  Stress: Not on file  Social Connections: Unknown (05/06/2023)   Received from [facility]   Social Network    Social Network: Not on file  Intimate Partner Violence: Unknown (05/06/2023)   Received from [facility]   HITS    Physically Hurt: Not on file    Insult or Talk Down To: Not on file    Threaten Physical Harm: Not on file    Scream or Curse: Not on file    Review of Systems: ROS negative except for what is noted on the assessment and plan.  Vitals:   02/25/24 1434  BP: 118/74  Pulse: 82  Temp: 98.5 F (36.9 C)  TempSrc: Oral  SpO2: 100%  Weight: 168 lb (76.2 kg)  Height: 4' 11 (1.499 m)    Physical Exam: Constitutional: well-appearing, sitting in chair,  in no acute distress Cardiovascular: regular rate and rhythm, no m/r/g Pulmonary/Chest: normal work of breathing on room air, lungs clear to auscultation bilaterally Skin: warm and dry Psych: normal mood and behavior  Assessment & Plan:     Patient discussed with Dr. Trudy  MDD (major depressive disorder), recurrent episode (HCC) Stable. Follows with BH.  Denies SI/HI.  Is here for emotional support animal paperwork to be filled out for her apartment which I have completed today.   Seizures (HCC) Follows with neurology.  Managed with Keppra  500 mg twice daily.  Denies recent seizures.  Obesity (BMI 30.0-34.9) Desires to continue working on lifestyle modifications.  Can discuss options if weight loss does not improve at follow-up.   Norman Lobstein, D.O. [facility] Health Internal Medicine, PGY-2 Phone: [phone] Date 02/25/2024 Time 4:14 PM

## 2024-02-27 NOTE — Progress Notes (Signed)
 Internal Medicine Clinic Attending  Case discussed with the resident at the time of the visit.  We reviewed the resident's history and exam and pertinent patient test results.  I agree with the assessment, diagnosis, and plan of care documented in the resident's note.

## 2024-03-06 ENCOUNTER — Ambulatory Visit: Admitting: Licensed Clinical Social Worker

## 2024-03-06 ENCOUNTER — Telehealth: Payer: Self-pay | Admitting: *Deleted

## 2024-03-06 DIAGNOSIS — F411 Generalized anxiety disorder: Secondary | ICD-10-CM

## 2024-03-06 NOTE — BH Specialist Note (Signed)
 Integrated Behavioral Health via Telemedicine Visit  03/06/2024 Bailey Rose [id number]  Number of Integrated Behavioral Health Clinician visits: Additional Visit  Session Start time: 0900   Session End time: 0934  Total time in minutes: 34    Referring Provider: PCP Patient/Family location: Home Bailey Rose Provider location: Office All persons participating in visit: Bailey Rose and Patient Types of Service: Telephone visit  I connected with Bailey Rose via Telephone and verified that I am speaking with the correct person using two identifiers. Discussed confidentiality: Yes   I discussed the limitations of telemedicine and the availability of in person appointments.  Discussed there is a possibility of technology failure and discussed alternative modes of communication if that failure occurs.  I discussed that engaging in this telemedicine visit, they consent to the provision of behavioral healthcare and the services will be billed under their insurance.  Patient and/or legal guardian expressed understanding and consented to Telemedicine visit: Yes   Presenting Concerns: Patient and/or family reports the following symptoms/concerns: The patient was added to the schedule today at her request. She presented as anxious but remained calm, engaged, and cooperative throughout the session. She reported experiencing symptoms of a panic attack triggered by social anxiety and working alongside another Retail buyer. The session focused on reflecting on this situation and exploring strategies for clear communication, emotion regulation, and planning for the future while letting go of excessive worry about the unknown. The patient appeared motivated, demonstrated strong leadership qualities, and continues to explore how she can best use her skills as part of her personal growth and self-discovery journey.   Plan: Initiate bi/weekly CBT-focused sessions for 6-8 weeks targeting social anxiety and panic.  Provide psychoeducation on the anxiety cycle and develop a personalized coping card (grounding, paced breathing, and the STOP skill) to use at work. Create an exposure hierarchy beginning with low-stakes interactions (e.g., brief task check-ins with the teammate), with at least two graded exposures to be completed before next session. Assign a daily thought record to challenge anticipatory worries and a brief panic log (triggers, body sensations, coping used, outcome). Practice an assertive "I-statement" script for workplace communication and role-play in session next visit. Track progress with GAD-7 and a brief panic severity screener at baseline and monthly. Encourage sleep hygiene and scheduled movement 3x/week to support regulation. Safety reviewed; patient knows to use coping skills, contact clinic, or seek urgent care/911 if symptoms escalate or safety concerns arise. Follow up in one week  Duration of problem: more than a year; Severity of problem: moderate  Patient and/or Family's Strengths/Protective Factors: Sense of purpose  Goals Addressed: Patient will:  Reduce symptoms of: anxiety    Progress towards Goals: Ongoing    Interventions: Interventions utilized:  CBT Cognitive Behavioral Therapy Standardized Assessments completed: Patient declined screening    Patient and/or Family Response: Patient appeared relieved after session with [facility]. Patient will reach back out to Bailey Rose if needed.   Clinical Assessment/Diagnosis  Generalized anxiety disorder    Assessment: Patient currently experiencing Anxiety .   Patient may benefit from CBT and Bi/ weekly sessions.  Next appointment- 09/02 at 1:30 pm in person.     I discussed the assessment and treatment plan with the patient and/or parent/guardian. They were provided an opportunity to ask questions and all were answered. They agreed with the plan and demonstrated an understanding of the instructions.   They were advised to  call back or seek an in-person evaluation if the symptoms worsen or if the condition fails to  improve as anticipated.  Bailey Rose, MSW, LCSW-A She/Her Behavioral Health Clinician [facility]  Internal Medicine Center

## 2024-03-06 NOTE — Telephone Encounter (Signed)
 Will forward to [facility] to await arrival of paperwork. Copied from CRM [id number]. Topic: General - Other >> Feb 19, 2024  1:31 PM Cherylann S wrote: Reason for CRM: Patient requesting forms to be filled out by provider for her emotional support animal. Reached out to [facility] and was informed that patient may drop paperwork at the office and they will notify her once they have been completed. Informed patient and she stated that she will drop them off tomorrow 08/06. >> Mar 06, 2024  8:54 AM Miquel SAILOR wrote: Patient calling on update. Called and transferred to office.

## 2024-03-11 NOTE — Telephone Encounter (Signed)
 Unable to reach pt regarding forms for emotional support animal. Pt did not answer the phone. I left detailed message to call the clinic back.

## 2024-03-18 ENCOUNTER — Ambulatory Visit: Admitting: Licensed Clinical Social Worker

## 2024-03-18 DIAGNOSIS — F331 Major depressive disorder, recurrent, moderate: Secondary | ICD-10-CM

## 2024-03-18 NOTE — BH Specialist Note (Signed)
 Integrated Behavioral Health Follow Up In-Person Visit  MRN: [id number] Name: Bailey Rose  Number of Integrated Behavioral Health Clinician visits: Additional Visit  Session Start time: 1330   Session End time: 1430  Total time in minutes: 60    Types of Service: Individual psychotherapy  Interpretor:No. Interpretor Name and Language: N/A  Subjective: Bailey Rose is a 28 y.o. female  Patient was referred by pcp for Counseling. Patient reports the following symptoms/concerns: Patient continues to work on self esteem and emotional regulation. CBT skills were introduced on today. Client also reported [place] and completing tasks to prepare for college courses.  Duration of problem: More than a year; Severity of problem: mild  Objective: Mood: NA and Affect: Appropriate Risk of harm to self or others: No plan to harm self or others   Patient and/or Family's Strengths/Protective Factors: Sense of purpose  Goals Addressed: Patient will:  Reduce symptoms of: mood instability   Progress towards Goals: Ongoing  Interventions: Interventions utilized:  CBT Cognitive Behavioral Therapy Standardized Assessments completed: Patient declined screening      Patient and/or Family Response: Patient agrees to bi-weekly sessions  Patient Centered Plan: Patient is on the following Treatment Plan(s): Patient will apply CBT skills and reflect for next session.  Clinical Assessment/Diagnosis  Moderate episode of recurrent major depressive disorder [facility])    Assessment: Patient currently experiencing Depression  Patient may benefit from Ongoing counseling.  Plan: Follow up with behavioral health clinician on : 09/16  Bailey Rose, MSW, LCSW-A She/Her Behavioral Health Clinician [facility]  Internal Medicine Center

## 2024-04-01 ENCOUNTER — Ambulatory Visit (INDEPENDENT_AMBULATORY_CARE_PROVIDER_SITE_OTHER): Admitting: Licensed Clinical Social Worker

## 2024-04-01 DIAGNOSIS — F331 Major depressive disorder, recurrent, moderate: Secondary | ICD-10-CM

## 2024-04-01 NOTE — BH Specialist Note (Signed)
 Integrated Behavioral Health Follow Up In-Person Visit  MRN: [id number] Name: Bailey Rose  Number of Integrated Behavioral Health Clinician visits: Additional Visit  Session Start time: 1330   Session End time: 1430  Total time in minutes: 60    Types of Service: Individual psychotherapy and General Behavioral Integrated Care (BHI)  Interpretor:No. Interpretor Name and Language: N/A  Subjective: Bailey Rose is a 28 y.o. female  Patient was referred by PCP for Counseling. Patient reports the following symptoms/concerns: Patient appeared in good spirits, calm and engaging. She reported nothing significant taking place since the last session and spent time reflecting on recent family and work matters. Patient's self-awareness has improved significantly, and she demonstrates maturity and a deeper understanding of the effectiveness of communication. 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 Therapist and patient discussed the way she delivers information to others, noting that at times it can come across as unprofessional or judgmental. [facility] suggested professionalism courses to help the patient further develop these skills. Patient also shared that she does not always feel comfortable being herself with the people she socializes with. She expressed enjoyment of [employer] [place], and [facility] located a local [employer] Education officer, community and emailed her the information, along with options for professionalism classes. Session concluded with scheduling of the next appointment.  [email or web address]  [email or web address]  [email or web address]  Duration of problem: Less than a year;  Severity of problem: mild  Objective: Mood: NA and Affect: Appropriate Risk of harm to self or others: No plan to harm self or others    Patient and/or Family's Strengths/Protective Factors: Social connections  Goals Addressed: Patient will:  Reduce symptoms of: depression   Progress towards Goals: Ongoing  Interventions: Interventions utilized:  CBT Cognitive Behavioral Therapy Standardized Assessments completed: Patient declined screening      Patient and/or Family Response: Patient agrees to ongoing counseling  Patient Centered Plan: Patient is on the following Treatment Plan(s): Bi-Weekly sessions  Clinical Assessment/Diagnosis  Moderate episode of recurrent major depressive disorder (HCC)    Assessment: Patient currently experiencing Depressive Disorder.   Patient may benefit from Ongoing Counseling.  Plan: Follow up with behavioral health clinician on : 04/15/24  Renda Pontes, MSW, LCSW-A She/Her Behavioral Health Clinician [facility]  Internal Medicine Center

## 2024-04-14 ENCOUNTER — Telehealth: Payer: Self-pay

## 2024-04-14 NOTE — Telephone Encounter (Signed)
 Patient was called due to needing to reschedule her appointment tomorrow 04/15/2024 because Renda Pontes will not be in the office.  No answer, but left detailed message to call back and ask to speak with Hme or [place].

## 2024-04-15 ENCOUNTER — Ambulatory Visit: Admitting: Licensed Clinical Social Worker

## 2024-04-24 ENCOUNTER — Ambulatory Visit: Admitting: Licensed Clinical Social Worker

## 2024-05-08 ENCOUNTER — Ambulatory Visit: Admitting: Licensed Clinical Social Worker

## 2024-05-08 DIAGNOSIS — F331 Major depressive disorder, recurrent, moderate: Secondary | ICD-10-CM | POA: Diagnosis present

## 2024-05-08 DIAGNOSIS — F411 Generalized anxiety disorder: Secondary | ICD-10-CM

## 2024-05-12 ENCOUNTER — Other Ambulatory Visit: Payer: Self-pay

## 2024-05-12 ENCOUNTER — Emergency Department (HOSPITAL_COMMUNITY)
Admission: EM | Admit: 2024-05-12 | Discharge: 2024-05-12 | Disposition: A | Discharge status: Home / Self Care | Attending: Emergency Medicine | Admitting: Emergency Medicine

## 2024-05-12 ENCOUNTER — Encounter (HOSPITAL_COMMUNITY): Payer: Self-pay | Admitting: Radiology

## 2024-05-12 DIAGNOSIS — J45909 Unspecified asthma, uncomplicated: Secondary | ICD-10-CM | POA: Diagnosis not present

## 2024-05-12 DIAGNOSIS — Z91040 Latex allergy status: Secondary | ICD-10-CM | POA: Insufficient documentation

## 2024-05-12 DIAGNOSIS — R569 Unspecified convulsions: Secondary | ICD-10-CM | POA: Insufficient documentation

## 2024-05-12 DIAGNOSIS — R110 Nausea: Secondary | ICD-10-CM | POA: Diagnosis not present

## 2024-05-12 HISTORY — DX: Unspecified convulsions: R56.9

## 2024-05-12 NOTE — ED Provider Notes (Signed)
 [place] EMERGENCY DEPARTMENT AT [facility] Provider Note   CSN: [id number] Arrival date & time: 05/12/24  0036     Patient presents with: Seizures   Bailey Rose is a 28 y.o. female.   The history is provided by the patient and a parent.  Seizures  She has history of asthma, polycystic ovarian syndrome, seizures and comes in by ambulance following a seizure at home.  Mother states that she was awakened by the dog barking and found the patient having generalized clonic seizure which lasted about 1 minute with a postictal phase following the seizure.  There is no incontinence and no bit lip or tongue.  She is currently taking levetiracetam  500 mg at bedtime, cannot tolerate higher doses because of drowsiness.  Her neurologist [facility] with the [facility].  She states she has been compliant with her medication.  Last seizure prior to tonight was in December.    Prior to Admission medications   Medication Sig Start Date End Date Taking? Authorizing Provider  acetaminophen  (TYLENOL ) 325 MG tablet Take 2 tablets (650 mg total) by mouth every 6 (six) hours as needed for mild pain (or Fever >/= 101). Patient not taking: Reported on 01/24/2024 07/14/22   Raenelle Donalda HERO, MD  ibuprofen  (ADVIL ) 800 MG tablet Take 1 tablet (800 mg total) by mouth every 8 (eight) hours as needed. 01/24/24   Rudy Carlin LABOR, MD  levETIRAcetam  (KEPPRA ) 500 MG tablet TAKE 1 TABLET BY MOUTH TWICE A DAY 01/25/23   Jaffe, Adam R, DO  nitrofurantoin , macrocrystal-monohydrate, (MACROBID ) 100 MG capsule Take 1 capsule (100 mg total) by mouth 2 (two) times daily. 1 po BID x 7days 02/20/24   Rudy Carlin LABOR, MD  Prenat-Fe Poly-Methfol-FA-DHA (VITAFOL  ULTRA) 29-0.6-0.4-200 MG CAPS Take 1 capsule by mouth daily before breakfast. Patient not taking: Reported on 01/24/2024 10/30/23   Rudy Carlin LABOR, MD  Probiotic Product (PROBIOTIC-10 PO) Take by mouth. Patient not taking: Reported on 01/24/2024    [provider]    Allergies: Latex    Review of Systems  Neurological:  Positive for seizures.  All other systems reviewed and are negative.   Updated Vital Signs BP 110/82   Pulse 90   Temp 98.2 F (36.8 C)   Resp 17   Ht 4' 11 (1.499 m)   Wt 75.8 kg   SpO2 98%   BMI 33.73 kg/m   Physical Exam Vitals and nursing note reviewed.   28 year old female, resting comfortably and in no acute distress. Vital signs are normal. Oxygen saturation is 98%, which is normal. Head is normocephalic and atraumatic. PERRLA, EOMI. Oropharynx is clear. Neck is nontender and supple. Lungs are clear without rales, wheezes, or rhonchi. Chest is nontender. Heart has regular rate and rhythm without murmur. Abdomen is soft, flat, nontender. Extremities have no cyanosis or edema, full range of motion is present. Skin is warm and dry without rash. Neurologic: Awake and alert and oriented, cranial nerves are intact, strength is 5/5 in all 4 extremities.  (all labs ordered are listed, but only abnormal results are displayed) Labs Reviewed - No data to display  EKG: None  Radiology: No results found.  Cardiac monitor shows normal sinus rhythm, per my interpretation. Procedures   Medications Ordered in the ED - No data to display  Medical Decision Making  Seizure in patient with known history of seizure disorder.  I have reviewed her past records, and she has 4 prior ED visits for seizures, most recently on 03/14/2023.  At this point, no indication for imaging or laboratory testing.  She will be observed in the emergency department.  I did discuss with her that Keppra  500 mg a day is likely a subtherapeutic dose and she will need to discuss other options with her neurologist.  In the meantime, I am discharging her with instructions to continue on her current dose of levetiracetam .  Advised of [place]  law that she may not drive a car until she has gone 6  months without a seizure.     Final diagnoses:  Seizure [facility])    ED Discharge Orders     None          Raford Lenis, MD 05/12/24 [phone]

## 2024-05-12 NOTE — ED Triage Notes (Signed)
 Mom witness patient have a seizure around 11:49 pm tonight (tonic clonic) lasted approximately 1-2 minutes. She was having seizure in the bed, so no fall. History the same. Takes Keppra . Last was about a year ago.   20G L FA 114/74 102 16 97%

## 2024-05-12 NOTE — Discharge Instructions (Addendum)
 Please discuss with your neurologist whether you should be switched to another seizure medication or have another seizure medication added to your current dose of levetiracetam .

## 2024-05-22 ENCOUNTER — Ambulatory Visit (INDEPENDENT_AMBULATORY_CARE_PROVIDER_SITE_OTHER): Admitting: Licensed Clinical Social Worker

## 2024-05-22 DIAGNOSIS — F411 Generalized anxiety disorder: Secondary | ICD-10-CM

## 2024-05-22 DIAGNOSIS — F331 Major depressive disorder, recurrent, moderate: Secondary | ICD-10-CM | POA: Diagnosis present

## 2024-05-27 ENCOUNTER — Ambulatory Visit: Admitting: Licensed Clinical Social Worker

## 2024-05-27 DIAGNOSIS — F331 Major depressive disorder, recurrent, moderate: Secondary | ICD-10-CM | POA: Diagnosis present

## 2024-05-27 NOTE — BH Specialist Note (Signed)
 Integrated Behavioral Health Follow Up In-Person Visit  MRN: [id number] Name: Bailey Rose  Number of Integrated Behavioral Health Clinician visits: Additional Visit  Session Start time: 1000   Session End time: 1100  Total time in minutes: 60    Types of Service: Individual psychotherapy and General Behavioral Integrated Care (BHI)   Interpretor:No. Interpretor Name and Language: N/A   Subjective: Bailey Rose is a 28 y.o. female  Patient was referred by PCP for Counseling.  Patient reports the following symptoms/concerns:   Continue reinforcing cognitive restructuring, adaptive thinking patterns, and CBT coping tools. Encourage regular practice and incremental exposure to professional challenges for increased self-confidence. Progress will be reviewed at subsequent sessions.  Patient is actively working on reinforcing cognitive restructuring, adaptive thinking patterns, and CBT coping tools to address ongoing symptoms and concerns. In sessions, patient is encouraged to practice regular use of these techniques, including identifying and challenging negative or unhelpful thoughts, reframing automatic beliefs, and gradually exposing themselves to professional challenges to increase self-confidence. The patient is making efforts to implement CBT skills between sessions, with progress and outcomes reviewed routinely to assess skill development and clinical improvements. Continued practice and incremental exposure remain recommended to foster further growth and resilience   Objective: Mood: NA and Affect: Appropriate Risk of harm to self or others: No plan to harm self or others       Patient and/or Family's Strengths/Protective Factors: Social connections   Goals Addressed: Patient will:  Reduce symptoms of: depression    Progress towards Goals: Ongoing   Interventions: Interventions utilized:  CBT Cognitive Behavioral Therapy Standardized Assessments completed: Patient  declined screening     Patient and/or Family Response: Patient agrees to ongoing counseling   Patient Centered Plan: Patient is on the following Treatment Plan(s): Bi-Weekly sessions   Clinical Assessment/Diagnosis   Moderate episode of recurrent major depressive disorder (HCC)      Assessment: Patient currently experiencing Depressive Disorder.    Patient may benefit from Ongoing Counseling.   Plan: Follow up with behavioral health clinician on : Patient will contact agency to schedule.   Renda Pontes, MSW, LCSW-A She/Her Behavioral Health Clinician [facility]  Internal Medicine Center

## 2024-05-29 NOTE — BH Specialist Note (Signed)
 Integrated Behavioral Health Follow Up In-Person Visit  MRN: [id number] Name: Bailey Rose  Number of Integrated Behavioral Health Clinician visits: Additional Visit  Session Start time: 1000   Session End time: 1100  Total time in minutes: 60   Types of Service: Individual psychotherapy and General Behavioral Integrated Care (BHI)   Interpretor:No. Interpretor Name and Language: N/A   Subjective: Bailey Rose is a 28 y.o. female  Patient was referred by PCP for Counseling. Patient reports the following symptoms/concerns:   Patient discussed persistent difficulty with self-criticism, anxiety related to professional interactions, and a pattern of underestimating abilities. CBT interventions addressed identifying and challenging negative self-talk, reframing unhelpful beliefs about performance and self-worth, and goal setting for professional growth. Patient practiced positive affirmations, assertive communication (via role-play), and journaling to track progress and strengths. Additional coping strategies included mindfulness, seeking feedback from trusted colleagues, and adopting a growth mindset for learning from challenges.  Short-term goals:  Patient will identify and document at least three negative self-critical thoughts per week and formulate balanced alternative thoughts for each.  Patient will engage in one assertive communication or feedback-seeking interaction in a professional setting before next session, and report on outcomes.  Patient will use mindfulness or journaling at least two times per week to track strengths and positive self-evaluations.  Plan: Continue reinforcing cognitive restructuring, adaptive thinking patterns, and CBT coping tools. Encourage regular practice and incremental exposure to professional challenges for increased self-confidence. Progress will be reviewed at subsequent sessions.    Objective: Mood: NA and Affect: Appropriate Risk of  harm to self or others: No plan to harm self or others       Patient and/or Family's Strengths/Protective Factors: Social connections   Goals Addressed: Patient will:  Reduce symptoms of: depression    Progress towards Goals: Ongoing   Interventions: Interventions utilized:  CBT Cognitive Behavioral Therapy Standardized Assessments completed: Patient declined screening     Patient and/or Family Response: Patient agrees to ongoing counseling   Patient Centered Plan: Patient is on the following Treatment Plan(s): Bi-Weekly sessions   Clinical Assessment/Diagnosis   Moderate episode of recurrent major depressive disorder (HCC)      Assessment: Patient currently experiencing Depressive Disorder.    Patient may benefit from Ongoing Counseling.   Plan: Follow up with behavioral health clinician on : Patient will contact agency to schedule.   Renda Pontes, MSW, LCSW-A She/Her Behavioral Health Clinician [facility]  Internal Medicine Center

## 2024-06-03 NOTE — Patient Instructions (Signed)
 [name]

## 2024-06-03 NOTE — BH Specialist Note (Unsigned)
 Integrated Behavioral Health Follow Up In-Person Visit  MRN: [id number] Name: Pearle Wandler  Number of Integrated Behavioral Health Clinician visits: Additional Visit  Session Start time: 1430   Session End time: 1530  Total time in minutes: 60    Types of Service: {CHL AMB TYPE OF SERVICE:[phone]}  Interpretor:{yes wn:[id number]} Interpretor Name and Language: ***  Subjective: Kyira Volkert is a 28 y.o. female accompanied by {Patient accompanied by:[phone]} Patient was referred by *** for ***. Patient reports the following symptoms/concerns: *** Duration of problem: ***; Severity of problem: {Mild/Moderate/Severe:20260}  Objective: Mood: {BHH MOOD:22306} and Affect: {BHH AFFECT:22307} Risk of harm to self or others: {CHL AMB BH Suicide Current Mental Status:21022748}  Life Context: Family and Social: *** School/Work: *** Self-Care: *** Life Changes: ***  Patient and/or Family's Strengths/Protective Factors: {CHL AMB BH PROTECTIVE FACTORS:[phone]}  Goals Addressed: Patient will:  Reduce symptoms of: {IBH Symptoms:21014056}   Increase knowledge and/or ability of: {IBH Patient Tools:21014057}   Demonstrate ability to: {IBH Goals:21014053}  Progress towards Goals: {CHL AMB BH PROGRESS TOWARDS GOALS:3432359857}  Interventions: Interventions utilized:  {IBH Interventions:21014054} Standardized Assessments completed: {IBH Screening Tools:21014051}      Patient and/or Family Response: ***  Patient Centered Plan: Patient is on the following Treatment Plan(s): ***  Clinical Assessment/Diagnosis  No diagnosis found.    Assessment: Patient currently experiencing ***.   Patient may benefit from ***.  Plan: Follow up with behavioral health clinician on : *** Behavioral recommendations: *** Referral(s): {IBH Referrals:21014055}  Renda Vella Pontes

## 2024-06-04 ENCOUNTER — Ambulatory Visit (INDEPENDENT_AMBULATORY_CARE_PROVIDER_SITE_OTHER): Payer: Self-pay | Admitting: Student

## 2024-06-04 ENCOUNTER — Other Ambulatory Visit (HOSPITAL_COMMUNITY)
Admission: RE | Admit: 2024-06-04 | Discharge: 2024-06-04 | Disposition: A | Discharge status: Home / Self Care | Source: Ambulatory Visit | Attending: Internal Medicine | Admitting: Internal Medicine

## 2024-06-04 VITALS — BP 110/67 | HR 77 | Temp 98.2°F | Ht 59.0 in | Wt 167.4 lb

## 2024-06-04 DIAGNOSIS — R569 Unspecified convulsions: Secondary | ICD-10-CM

## 2024-06-04 DIAGNOSIS — F17210 Nicotine dependence, cigarettes, uncomplicated: Secondary | ICD-10-CM | POA: Diagnosis not present

## 2024-06-04 DIAGNOSIS — Z0000 Encounter for general adult medical examination without abnormal findings: Secondary | ICD-10-CM | POA: Insufficient documentation

## 2024-06-04 DIAGNOSIS — Z79899 Other long term (current) drug therapy: Secondary | ICD-10-CM | POA: Diagnosis not present

## 2024-06-04 DIAGNOSIS — E66811 Obesity, class 1: Secondary | ICD-10-CM | POA: Diagnosis not present

## 2024-06-04 DIAGNOSIS — Z8619 Personal history of other infectious and parasitic diseases: Secondary | ICD-10-CM | POA: Insufficient documentation

## 2024-06-04 DIAGNOSIS — F331 Major depressive disorder, recurrent, moderate: Secondary | ICD-10-CM

## 2024-06-04 DIAGNOSIS — E669 Obesity, unspecified: Secondary | ICD-10-CM

## 2024-06-04 DIAGNOSIS — Z6833 Body mass index (BMI) 33.0-33.9, adult: Secondary | ICD-10-CM | POA: Diagnosis not present

## 2024-06-04 DIAGNOSIS — G43909 Migraine, unspecified, not intractable, without status migrainosus: Secondary | ICD-10-CM
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
## 2024-06-05 ENCOUNTER — Ambulatory Visit: Payer: Self-pay | Admitting: Student

## 2024-06-05 LAB — HEMOGLOBIN A1C
Est. average glucose Bld gHb Est-mCnc: 91 mg/dL
Hgb A1c MFr Bld: 4.8 % (ref 4.8–5.6)

## 2024-06-05 LAB — URINE CYTOLOGY ANCILLARY ONLY
Chlamydia: NEGATIVE
Comment: NEGATIVE
Comment: NEGATIVE
Comment: NORMAL
Neisseria Gonorrhea: NEGATIVE
Trichomonas: NEGATIVE

## 2024-06-05 LAB — HCG, SERUM, QUALITATIVE: hCG,Beta Subunit,Qual,Serum: NEGATIVE m[IU]/mL (ref <6)

## 2024-06-05 LAB — HIV ANTIBODY (ROUTINE TESTING W REFLEX): HIV Screen 4th Generation wRfx: NONREACTIVE

## 2024-06-05 LAB — RPR: RPR Ser Ql: NONREACTIVE

## 2024-06-05 NOTE — Assessment & Plan Note (Signed)
 Stable. Follows with BH.  Denies SI/HI

## 2024-06-05 NOTE — Patient Instructions (Signed)
 [name]

## 2024-06-05 NOTE — Assessment & Plan Note (Addendum)
 Requests STI testing as she does annually. She is in a monogamous relationship and is without symptoms. 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 Also requests pregnancy test. Not currently on contraception but follows with OB and will make an appointment for Nexplanon .

## 2024-06-05 NOTE — Assessment & Plan Note (Signed)
 Desires to continue working on lifestyle modifications.  Can discuss options if weight loss does not improve at follow-up.

## 2024-06-05 NOTE — Assessment & Plan Note (Signed)
 Had another seizure 10/27 prompting ED visit. Issue has been that she remains taking a sub-therapeutic dose of Keppra  due to side effects of lethargy. She has been taking 500 daily (prescribed BID). I explained the risks of continuing this inadequate regimen and reinforced the fact that she is not allowed to drive for 6 months. I have sent a referral to Neurology as she does not like current management at the [facility]. Return precautions given.

## 2024-06-06 NOTE — Progress Notes (Signed)
 Internal Medicine Clinic Attending  Case discussed with the resident at the time of the visit.  We reviewed the resident's history and exam and pertinent patient test results.  I agree with the assessment, diagnosis, and plan of care documented in the resident's note.

## 2024-06-18 ENCOUNTER — Telehealth: Payer: Self-pay | Admitting: *Deleted

## 2024-06-18 NOTE — Telephone Encounter (Signed)
 RTC to patient stated needs a letter for her new housing.  Has to be written within the last year.  Patient was informed that there is a letter in her chart that was done in August of this year,.  Patient stated that this will be ok.  Patient wanted to have the letter faxed to her or placed in MyChart.  Unable to send vis MyChart as doctor's handwritten signature cannot be sent via MyChart.  RTC to patient will have a copy of letter placed in the front office for patient to pick up on tomorrow.   Copied from CRM [id number]. Topic: General - Other >> Jun 18, 2024 12:16 PM Cherylann S wrote: Reason for CRM: Patient is requesting a form for her emotional support animal as she is needing to complete a re certification for apt complex. Please contact patient at [phone] for additional information.

## 2024-06-19 NOTE — Telephone Encounter (Signed)
 Message sent to [facility] To f/u with the request below.  Copied from CRM [id number]. Topic: General - Other >> Jun 18, 2024 12:16 PM Cherylann S wrote: Reason for CRM: Patient is requesting a form for her emotional support animal as she is needing to complete a re certification for apt complex. Please contact patient at [phone] for additional information. >> Jun 19, 2024  9:01 AM Alfonso ORN wrote: esa paperwork will picked up by  Devron Seiter (mother) today

## 2024-06-19 NOTE — Telephone Encounter (Signed)
 Unable to reach pt to let her know that her letter is ready for pick up at the front desk, but no answer. I left detailed message to cal back.

## 2024-06-25 ENCOUNTER — Ambulatory Visit: Admitting: Obstetrics and Gynecology

## 2024-07-14 NOTE — Telephone Encounter (Signed)
 Bailey Rose's sch to be posted. Once posted in January 2026 pts will be called and scheduled.  Please have the pt to call back after 07/21/2024 for sch.  Copied from CRM [id number]. Topic: Appointments - Scheduling Inquiry for Clinic >> Jul 01, 2024 11:41 AM Bailey Rose ORN wrote: Reason for CRM: Patient calling to get scheduled for January with Renda Pontes. Called CAL & spoke with [facility] and she stated that Renda is out and will not be back until after the new year & there is no appts at this time. Informed patient of this and she stated she knows but Renda told her to call and schedule it. Please give patient a call to get her scheduled once availability opens. CB #: [id number].

## 2024-07-21 NOTE — Telephone Encounter (Signed)
 LMOM for the pt that she will be called back to schedule an appointment with Renda and to ask for a Referral Coordinator.  Copied from CRM #[id number]. Topic: Appointments - Scheduling Inquiry for Clinic >> Jul 18, 2024  9:07 AM Graeme ORN wrote: Reason for CRM: Patient called to schedule behavioral health. Called CAL> Not able to schedule for her yet. Patient states she is confused. She received an email that said she would be returning on the 5th and to call during this time to schedule. Would like someone to reach out to once schedule is available. Thank You

## 2024-07-23 ENCOUNTER — Ambulatory Visit: Admitting: Obstetrics and Gynecology

## 2024-07-24 ENCOUNTER — Ambulatory Visit (INDEPENDENT_AMBULATORY_CARE_PROVIDER_SITE_OTHER): Payer: Self-pay | Admitting: Licensed Clinical Social Worker

## 2024-07-24 DIAGNOSIS — F330 Major depressive disorder, recurrent, mild: Secondary | ICD-10-CM

## 2024-07-24 NOTE — Patient Instructions (Addendum)
 Visit Information  Thank you for taking time to visit with me today. Please don't hesitate to contact me if I can be of assistance to you.     (1) identify one trusted support (aunt, bio dad, or other) to debrief with before making major job decisions; (2) practice pausing and using respectful I statements in conflict (e.g., I felt disrespected when), rather than reacting in the moment; (3) keep a brief log of any workplace conflicts or stressful interactions (what happened, how she responded, how she could respond differently) to bring to next session for skills practice.   How I Come Across At Work (And How To Stay Professional When Others Aren't) Use this worksheet to think about how you show up at work and what you want to change or keep the same.  1. How I Want People To See Me At Work Circle or write a few words that fit how you want to be seen: Reliable  Calm  Confident  Team player Respectful  Assertive  Hard working  Honest Other words I want people to use for me:    2. How I Think People See Me Right Now At work, I think people see me as:   One thing I like about how I come across:  One thing I want to work on:   3. I Statements Instead Of Snapping Back Fill this in using real situations. Example 1 Trigger / situation:  My usual reaction (what I say or do):  New way to respond using an I statement: I felt __________________ when __________________ because __________________. Next time I need __________________.  Example 2 Trigger / situation:  My usual reaction:  New I statement:   4. My Body Language Check When I am upset at work, I tend to: []  Roll my eyes []  Raise my voice []  Get quiet and shut down []  Walk off []  Cross my arms []  Use sarcasm []  Other: _________________________________________________ One body language habit I want to change:  What I will do instead (for example: steady tone, relaxed shoulders, eye contact):   5.  How To Stay Professional When Others Are Not Use this section to plan what you will do when people around you are rude, loud, or unprofessional. When someone is being rude or aggressive, I can: []  Take a slow breath and avoid reacting right away []  Lower my voice instead of matching their tone []  Say, I am willing to talk, but not like this. []  Ask to continue the conversation later []  Step away and talk to a supervisor if needed []  Write down what happened instead of arguing One phrase I can use to stay professional: Right now this feels tense. I want to solve the problem, not argue. Another phrase I can use:  What I will NOT do (even if they are doing it): []  Yell or curse []  Call names []  Get in someone's face []  Slam things or storm off in anger []  Talk about them to everyone instead of using the chain of command One behavior I am choosing to stop at work:   6. Using The Chain Of Command If there is a problem, I can: Talk to the person directly if it feels safe. If that does not work, go to radio producer. If needed, follow workplace policy for reporting issues. Who is my direct supervisor?  If my supervisor is part of the problem, the next person I can go to is:   7. One Thing I Will  Practice This Week This week, I will focus on practicing: []  One new I statement []  One body language change []  One professional response when someone else is not acting professional Describe your specific goal:

## 2024-07-24 NOTE — BH Specialist Note (Signed)
 Integrated Behavioral Health Follow Up In-Person Visit  MRN: [id number] Name: Bailey Rose  Number of Integrated Behavioral Health Clinician visits: Additional Visit  Session Start time: 1000   Session End time: 1100  Total time in minutes: 60    Types of Service: Individual psychotherapy and General Behavioral Integrated Care (BHI)  Interpretor:No. Interpretor Name and Language: N/A  Subjective: Bailey Rose is a 29 y.o. female  Patient was referred by PCP  for [facility].  Patient reports the following symptoms/concerns: Client was seen in office today, presenting calm, cooperative, and engaging. She reported positive recent interactions with her biological mother, noting these moments have been rare in the past, and described a positive interaction between her mother and father. Therapist reinforced these observations and encouraged the client to recognize how her growth, communication, and boundaries may be positively impacting family dynamics. Client also shared increased use of natural supports, including reaching out to her aunt and contacting her biological father for help, which was validated as appropriate and healthy help-seeking. Client recently quit her job and session was spent discussing.     Duration of problem: More than a year; Severity of problem: mild  Objective: Mood: NA and Affect: Appropriate Risk of harm to self or others: No plan to harm self or others  Life Context: Family and Social: Client resides with Bio mother.  School/Work: Client is employed. Self-Care: Client has a dog she enjoys. Life Changes: Conflict with employment   Patient and/or Family's Strengths/Protective Factors: Sense of purpose  Goals Addressed: Patient will:  Reduce symptoms of: mood instability   Increase knowledge and/or ability of: coping skills and self-management skills   Demonstrate ability to: Increase healthy adjustment to current life circumstances  Progress towards  Goals: Ongoing  Interventions: Interventions utilized:  Solution-Focused Strategies, CBT Cognitive Behavioral Therapy, Supportive Counseling, and Psychoeducation and/or Health Education Standardized Assessments completed: Patient declined screening      Patient  Response: Client reports benefiting from sessions and wants to continue.  Patient Centered Plan: Patient is on the following Treatment Plan(s): Bi-Weekly : 60 minute sessions   Clinical Assessment/Diagnosis  Mild episode of recurrent major depressive disorder    Assessment: Client reported she recently quit her job after being informed she was going to be suspended, stating she chose to resign rather than accept the suspension. Therapist used CBT and problem-solving approaches to graph and break down the workplace situations client described, exploring triggers, thoughts, feelings, and behavioral responses. Together we identified alternative, more professional responses, including bringing concerns to a supervisor, clarifying expectations, communicating respectfully with coworkers, and focusing on being a team player even when others have different or more aggressive personalities. We discussed how some workplaces have poor leadership and how relationship-building, emotional regulation, and professionalism can still be protective factors. Therapist provided psychoeducation on perception and workplace image, highlighting that others often respond to how she presents herself (tone, body language, follow-through) and how these factors influence discipline decisions and opportunities for advancement. Client was open to feedback, demonstrated insight at times, and expressed willingness to continue working on professionalism and communication.  Patient may benefit from Ongoing OPT.  Plan: Follow up with behavioral health clinician on : 08/07/24 In-Person at 1:30 Pm  Behavioral recommendations: Behavioral recommendations include: (1)  identify one trusted support (aunt, bio dad, or other) to debrief with before making major job decisions; (2) practice pausing and using respectful I statements in conflict (e.g., I felt disrespected when), rather than reacting in the moment; (3) keep a  brief log of any workplace conflicts or stressful interactions (what happened, how she responded, how she could respond differently) to bring to next session for skills practice. No safety concerns were reported or observed. Client remains appropriate for outpatient therapy and is scheduled to return on 08/07/2024 at 1:30 pm.  Renda Pontes, MSW, LCSW-A She/Her Behavioral Health Clinician [facility]  Internal Medicine Center Direct Dial:[phone]  Fax [phone] Main Office Phone: [phone] [address] [place] [place], [place] [address] Website: [facility] Internal Medicine [facility]  [place], [place]  [place]

## 2024-07-25 ENCOUNTER — Encounter: Payer: Self-pay | Admitting: Neurology

## 2024-07-25 ENCOUNTER — Telehealth: Payer: Self-pay | Admitting: Neurology

## 2024-07-25 ENCOUNTER — Ambulatory Visit: Admitting: Neurology

## 2024-07-25 VITALS — BP 104/69 | HR 75 | Ht 59.0 in | Wt 169.0 lb

## 2024-07-25 DIAGNOSIS — G40309 Generalized idiopathic epilepsy and epileptic syndromes, not intractable, without status epilepticus: Secondary | ICD-10-CM | POA: Diagnosis not present

## 2024-07-25 MED ORDER — LEVETIRACETAM 500 MG PO TABS: 500.0000 mg | ORAL_TABLET | Freq: Two times a day (BID) | ORAL | 1 refills | Status: AC

## 2024-07-25 MED ORDER — FOLIC ACID 1 MG PO TABS: 1.0000 mg | ORAL_TABLET | Freq: Every day | ORAL | 4 refills | Status: AC

## 2024-07-25 NOTE — Telephone Encounter (Signed)
 Patient called to notify will running 15 minutes late for 9:15am appointment. Ask if would like to reschedule. She refuse to reschedule and on her way for appointment. Informed her she may have to reschedule

## 2024-07-25 NOTE — Patient Instructions (Addendum)
 Continue with Keppra  1000 mg nightly   Start folic acid  1 mg daily Will obtain a routine EEG  Please call for a breakthrough seizure  Return in 6 months or sooner if worse

## 2024-07-25 NOTE — Progress Notes (Signed)
 "  [facility]  PATIENT: Bailey Rose DOB: 02-26-1996  REQUESTING CLINICIAN: Trudy Mliss Dragon, MD HISTORY FROM: Patient and chart review REASON FOR VISIT: Establish care for generalized epilepsy   HISTORICAL  CHIEF COMPLAINT:  Chief Complaint  Patient presents with   New Patient (Initial Visit)    Room 13 Alone internal referral for seizures    HISTORY OF PRESENT ILLNESS:  Discussed the use of AI scribe software for clinical note transcription with the patient, who gave verbal consent to proceed.  Bailey Rose is a 29 year old female with epilepsy who presents for management of her seizures occurring during sleep.  Seizures began around 2023/2024, occurring exclusively during sleep. She is unaware of them until waking up, often to concerned onlookers. The last seizure occurred three weeks ago and was minor, not requiring hospital admission.  Her mother describes the seizures as involving convulsions, drooling, lips turning purple, eyes rolling back, and tongue biting. There is no history of urinary incontinence during seizures. The first seizure resulted in her falling off a couch, but she now sleeps in the middle of the bed with pillows for protection.  She takes Keppra , 500 mg at night, with a total prescribed dose of 1000 mg. She experiences drowsiness when taking the medication in the morning, so she prefers taking the full dose at night. She reports increased irritability.  There is no family history of seizures, except for a deceased nephew who had kidney issues. Her mother recalls frequent falls on her forehead during childhood, resulting in a dimple, but no history of febrile seizures or head injuries. She mentions experiencing staring spells during childhood but was otherwise active.  She has a history of mild depression, for which she attends therapy. She does not feel the depression is severe enough to warrant medication beyond therapy.     Handedness: Right handed   Onset: 2020 09/2022  Seizure Type: Nocturnal seizures, described as generalized convulsions  Current frequency: Last one 3 weeks ago  Any injuries from seizures: Tongue biting, generalized body soreness   Seizure risk factors: None reported  Previous ASMs: Levetiracetam   Currenty ASMs: Levetiracetam  500 mg twice daily but currently taking 1000 mg nightly  ASMs side effects: Drowsiness   Brain Images: Normal MRI  Previous EEGs: Normal routine and ambulatory EEG   OTHER MEDICAL CONDITIONS: Seizure disorder, Anxiety/Depression   REVIEW OF SYSTEMS: Full 14 system review of systems performed and negative with exception of: As noted in the HPI   ALLERGIES: Allergies[1]  HOME MEDICATIONS: Outpatient Medications Prior to Visit  Medication Sig Dispense Refill   ibuprofen  (ADVIL ) 800 MG tablet Take 1 tablet (800 mg total) by mouth every 8 (eight) hours as needed. 30 tablet 5   levETIRAcetam  (KEPPRA ) 500 MG tablet TAKE 1 TABLET BY MOUTH TWICE A DAY 60 tablet 5   No facility-administered medications prior to visit.    PAST MEDICAL HISTORY: Past Medical History:  Diagnosis Date   Acne cystica 02/10/2015   Asthma    Delayed gastric emptying 01/08/2020   Related to progesterone  effect of pregnancy  Rx Reglan  5mg  q8h prn   Dysmenorrhea 06/24/2013   Migraines    Polycystic ovarian syndrome    Premenstrual syndrome 05/01/2021   Seizures (HCC)    Sickle cell trait     PAST SURGICAL HISTORY: Past Surgical History:  Procedure Laterality Date   TONSILLECTOMY     WISDOM TOOTH EXTRACTION  2017    FAMILY HISTORY: Family History  Problem Relation  Age of Onset   Sickle cell anemia Mother    Breast cancer Mother    Cancer Maternal Grandmother        lung   Migraines Maternal Grandmother    Lupus Maternal Grandmother     SOCIAL HISTORY: Social History   Socioeconomic History   Marital status: Single    Spouse name: Not on file   Number  of children: Not on file   Years of education: Not on file   Highest education level: Not on file  Occupational History   Not on file  Tobacco Use   Smoking status: Some Days    Current packs/day: 0.25    Average packs/day: 0.3 packs/day for 2.0 years (0.5 ttl pk-yrs)    Types: Cigarettes   Smokeless tobacco: Never   Tobacco comments:    Smoking 3-4 cigs per day  Vaping Use   Vaping status: Former  Substance and Sexual Activity   Alcohol use: Not Currently    Comment: States socially/ while on vacation   Drug use: Not Currently    Types: Marijuana   Sexual activity: Yes    Partners: Male    Comment: Non-latex condoms  Other Topics Concern   Not on file  Social History Narrative   Not on file   Social Drivers of Health   Tobacco Use: High Risk (07/25/2024)   Patient History    Smoking Tobacco Use: Some Days    Smokeless Tobacco Use: Never    Passive Exposure: Not on file  Financial Resource Strain: Medium Risk (06/04/2024)   Overall Financial Resource Strain (CARDIA)    Difficulty of Paying Living Expenses: Somewhat hard  Food Insecurity: Food Insecurity Present (06/04/2024)   Epic    Worried About Programme Researcher, Broadcasting/film/video in the Last Year: Sometimes true    Ran Out of Food in the Last Year: Often true  Transportation Needs: Unmet Transportation Needs (06/04/2024)   Epic    Lack of Transportation (Medical): Yes    Lack of Transportation (Non-Medical): Yes  Physical Activity: Sufficiently Active (06/04/2024)   Exercise Vital Sign    Days of Exercise per Week: 5 days    Minutes of Exercise per Session: 80 min  Stress: Stress Concern Present (06/04/2024)   [employer] of Occupational Health - Occupational Stress Questionnaire    Feeling of Stress: Rather much  Social Connections: Moderately Isolated (06/04/2024)   Social Connection and Isolation Panel    Frequency of Communication with Friends and Family: More than three times a week    Frequency of Social  Gatherings with Friends and Family: Twice a week    Attends Religious Services: 1 to 4 times per year    Active Member of [employer] or Organizations: No    Attends Banker Meetings: Never    Marital Status: Never married  Intimate Partner Violence: At Risk (06/04/2024)   Epic    Fear of Current or Ex-Partner: No    Emotionally Abused: Yes    Physically Abused: No    Sexually Abused: No  Depression (PHQ2-9): High Risk (08/29/2023)   Depression (PHQ2-9)    PHQ-2 Score: 18  Alcohol Screen: Low Risk (06/04/2024)   Alcohol Screen    Last Alcohol Screening Score (AUDIT): 1  Housing: Low Risk (06/04/2024)   Epic    Unable to Pay for Housing in the Last Year: No    Number of Times Moved in the Last Year: 0    Homeless in the Last Year:  No  Utilities: Not At Risk (06/04/2024)   Epic    Threatened with loss of utilities: No  Health Literacy: Adequate Health Literacy (06/04/2024)   B1300 Health Literacy    Frequency of need for help with medical instructions: Never    PHYSICAL EXAM  GENERAL EXAM/CONSTITUTIONAL: Vitals:  Vitals:   07/25/24 0929  BP: 104/69  Pulse: 75  SpO2: 96%  Weight: 169 lb (76.7 kg)  Height: 4' 11 (1.499 m)   Body mass index is 34.13 kg/m. Wt Readings from Last 3 Encounters:  07/25/24 169 lb (76.7 kg)  06/04/24 167 lb 6.4 oz (75.9 kg)  05/12/24 167 lb (75.8 kg)   Patient is in no distress; well developed, nourished and groomed; neck is supple  MUSCULOSKELETAL: Gait, strength, tone, movements noted in Neurologic exam below  NEUROLOGIC: MENTAL STATUS:      No data to display         awake, alert, oriented to person, place and time recent and remote memory intact normal attention and concentration language fluent, comprehension intact, naming intact fund of knowledge appropriate  CRANIAL NERVE:  2nd, 3rd, 4th, 6th - Visual fields full to confrontation, extraocular muscles intact, no nystagmus 5th - facial sensation symmetric 7th -  facial strength symmetric 8th - hearing intact 9th - palate elevates symmetrically, uvula midline 11th - shoulder shrug symmetric 12th - tongue protrusion midline  MOTOR:  normal bulk and tone, full strength in the BUE, BLE  SENSORY:  normal and symmetric to light touch  COORDINATION:  finger-nose-finger, fine finger movements normal  GAIT/STATION:  normal   DIAGNOSTIC DATA (LABS, IMAGING, TESTING) - I reviewed patient records, labs, notes, testing and imaging myself where available.  Lab Results  Component Value Date   WBC 10.5 03/14/2023   HGB 12.7 03/14/2023   HCT 38.7 03/14/2023   MCV 74.6 (L) 03/14/2023   PLT 230 03/14/2023      Component Value Date/Time   NA 139 03/14/2023 0445   NA 139 11/26/2019 1149   K 3.7 03/14/2023 0445   CL 103 03/14/2023 0445   CO2 25 03/14/2023 0445   GLUCOSE 104 (H) 03/14/2023 0445   BUN 12 03/14/2023 0445   BUN 10 11/26/2019 1149   CREATININE 0.80 03/14/2023 0445   CREATININE 0.62 02/26/2015 1351   CALCIUM  9.2 03/14/2023 0445   PROT 7.1 02/01/2023 0215   PROT 7.5 11/26/2019 1149   ALBUMIN 4.0 02/01/2023 0215   ALBUMIN 4.5 11/26/2019 1149   AST 13 (L) 02/01/2023 0215   ALT 10 02/01/2023 0215   ALKPHOS 56 02/01/2023 0215   BILITOT 0.7 02/01/2023 0215   BILITOT <0.2 11/26/2019 1149   GFRNONAA >60 03/14/2023 0445   GFRAA >60 12/07/2019 2246   Lab Results  Component Value Date   CHOL 89 (L) 11/26/2019   HDL 35 (L) 11/26/2019   LDLCALC 38 11/26/2019   TRIG 77 11/26/2019   Lab Results  Component Value Date   HGBA1C 4.8 06/04/2024   No results found for: VITAMINB12 Lab Results  Component Value Date   TSH 1.090 11/26/2019    MRI Brain 07/14/2022 Normal MRI of the brain   Routine EEG 07/14/2022 Normal    ASSESSMENT AND PLAN  29 y.o. year old female  with history of generalized epilepsy was presenting for management of her epilepsy.  She is currently on Keppra  500 mg twice daily but is taking Keppra  nightly,  recently increased to a total of 1000 mg.  She reports her last seizure  was on 3 weeks ago described as a minor 1 still nocturnal.   Generalized idiopathic epilepsy Seizures primarily during sleep, with symptoms including drooling, lip cyanosis, tongue biting, and occasional falls. No febrile seizures or significant head injuries. Previous EEGs and MRIs were normal. Currently on Keppra  1000 mg at night, with drowsiness as a side effect. No significant irritability or mood swings attributed to Keppra  but she does have a history of depression and anxiety. Discussed potential switch to lamotrigine due to depression, but she prefers to continue current regimen.  Discussed the need to switch from immediate release to extended release 1000 mg nightly but she would like to continue with her current formulation.  Discussed the importance of folic acid  supplementation due to increased risk of neural tube defects with antiepileptic medications. - Continue Keppra  1000 mg at night. - Ordered routine EEG for baseline assessment. - Will prescribe folic acid  supplementation. - Scheduled follow-up in six months to evaluate medication efficacy and consider switch to extended-release formulation if needed.  Depression Mild depression managed with therapy. No significant depressive symptoms attributed to Keppra  per patient. Discussed potential benefits of lamotrigine for both epilepsy and depression, but she prefers to continue current therapy regimen. - Continue current therapy for depression.    1. 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  Other  recommendations include using caution when using heavy equipment or power tools. Avoid working on ladders or at heights. Take showers instead of baths.  Do not swim alone.  Ensure the water temperature is not too high on the home water heater. Do not go swimming alone. Do not lock yourself in a room alone (i.e. bathroom). When caring for infants or small children, sit down when holding, feeding, or changing them to minimize risk of injury to the child in the event you have a seizure. Maintain good sleep hygiene. Avoid alcohol.  Also recommend adequate sleep, hydration, good diet and minimize stress.   During the Seizure  - First, ensure adequate ventilation and place patients on the floor on their left side  Loosen clothing around the neck and ensure the airway is patent. If the patient is clenching the teeth, do not force the mouth open with any object as this can cause severe damage - Remove all items from the surrounding that can be hazardous. The patient may be oblivious to what's happening and may not even know what he or she is doing. If the patient is confused and wandering, either gently guide him/her away and block access to outside areas - Reassure the individual and be comforting - Call 911. In most cases, the seizure ends before EMS arrives. However, there are cases when seizures may last over 3 to 5 minutes. Or the individual may have developed breathing difficulties or severe injuries. If a pregnant patient or a person with diabetes develops a seizure, it is prudent to call an ambulance. - Finally, if the patient does not regain full consciousness, then call EMS. Most patients will remain confused for about 45 to 90 minutes after a seizure, so you must use judgment in calling for help. -  Avoid restraints but make sure the patient is in a bed with padded side rails - Place the individual in a lateral position with the neck slightly flexed; this will help the saliva drain from the mouth and  prevent the tongue from falling backward - Remove all nearby furniture and other hazards from the area - Provide verbal assurance as the individual is regaining consciousness - Provide the patient with privacy if possible - Call for help and start treatment as ordered by the caregiver   After the Seizure (Postictal Stage)  After a seizure, most patients experience confusion, fatigue, muscle pain and/or a headache. Thus, one should permit the individual to sleep. For the next few days, reassurance is essential. Being calm and helping reorient the person is also of importance.  Most seizures are painless and end spontaneously. Seizures are not harmful to others but can lead to complications such as stress on the lungs, brain and the heart. Individuals with prior lung problems may develop labored breathing and respiratory distress.    Discussed Patients with epilepsy have a small risk of sudden unexpected death, a condition referred to as sudden unexpected death in epilepsy (SUDEP). SUDEP is defined specifically as the sudden, unexpected, witnessed or unwitnessed, nontraumatic and nondrowning death in patients with epilepsy with or without evidence for a seizure, and excluding documented status epilepticus, in which post mortem examination does not reveal a structural or toxicologic cause for death     Orders Placed This Encounter  Procedures   EEG adult    Meds ordered this encounter  Medications   levETIRAcetam  (KEPPRA ) 500 MG tablet    Sig: Take 1 tablet (500 mg total) by mouth 2 (two) times daily.    Dispense:  180 tablet    Refill:  1   folic acid  (FOLVITE ) 1 MG tablet    Sig: Take 1 tablet (1 mg total) by mouth daily.    Dispense:  90 tablet    Refill:  4    Return in about 6 months (around 01/22/2025).    Pastor Falling, MD 07/25/2024, 10:29 AM  [facility] [address] [place], [place] [address] [phone]     [1]  Allergies Allergen  Reactions   Latex Itching    Reports itching and irritation with condom use.   "

## 2024-07-25 NOTE — Telephone Encounter (Signed)
 error

## 2024-07-28 ENCOUNTER — Telehealth: Payer: Self-pay | Admitting: Neurology

## 2024-07-28 NOTE — Telephone Encounter (Signed)
 Pt was in 07/25/24 and wanted a call this week to schedule her EEG and 6 month FU with Dr. Gregg. I left vm this AM to call us  back to do so.

## 2024-07-31 ENCOUNTER — Ambulatory Visit: Admitting: Neurology

## 2024-07-31 DIAGNOSIS — G40309 Generalized idiopathic epilepsy and epileptic syndromes, not intractable, without status epilepticus: Secondary | ICD-10-CM

## 2024-08-07 ENCOUNTER — Ambulatory Visit: Payer: Self-pay | Admitting: Licensed Clinical Social Worker

## 2024-08-07 DIAGNOSIS — F330 Major depressive disorder, recurrent, mild: Secondary | ICD-10-CM

## 2024-08-07 DIAGNOSIS — F411 Generalized anxiety disorder: Secondary | ICD-10-CM

## 2024-08-07 NOTE — BH Specialist Note (Signed)
 Integrated Behavioral Health Follow Up In-Person Visit  MRN: [id number] Name: Bailey Rose  Number of Integrated Behavioral Health Clinician visits: Additional Visit  Session Start time: 1330   Session End time: 1430  Total time in minutes: 60    Types of Service: Individual psychotherapy and General Behavioral Integrated Care (BHI)   Interpretor:No. Interpretor Name and Language: N/A   Subjective: Bailey Rose is a 29 y.o. female  Patient was referred by PCP  for [facility].   Patient reports the following symptoms/concerns: Self reflection on the relationships she has inquired through out her life and how she impacted them.     Duration of problem: More than a year; Severity of problem: mild   Objective: Mood: NA and Affect: Appropriate Risk of harm to self or others: No plan to harm self or others   Life Context: Family and Social: Client resides with Bio mother.  School/Work: Client is employed. Self-Care: Client has a dog she enjoys. Life Changes: Conflict with employment    Patient and/or Family's Strengths/Protective Factors: Sense of purpose   Goals Addressed: Patient will:  Reduce symptoms of: mood instability   Increase knowledge and/or ability of: coping skills and self-management skills   Demonstrate ability to: Increase healthy adjustment to current life circumstances   Progress towards Goals: Ongoing   Interventions: Interventions utilized:  Solution-Focused Strategies, CBT Cognitive Behavioral Therapy, Supportive Counseling, and Psychoeducation and/or Health Education Standardized Assessments completed: Patient declined screening           Patient  Response: Client reports benefiting from sessions and wants to continue.   Patient Centered Plan: Patient is on the following Treatment Plan(s): Bi-Weekly : 60 minute sessions    Clinical Assessment/Diagnosis   Mild episode of recurrent major depressive disorder      Assessment: Patient was  seen for an outpatient psychotherapy session. She presented calm, cooperative, and reflective, and was open to exploring her history of relationships. Session focused on self-reflection about the relationships she has developed throughout her life and how she believes she has impacted them. Patient described multiple past and current relationships in which she did not feel fully accepted for who she is, often feeling like she had to shrink herself, over-accommodate, or change parts of her personality to be included. She identified a pattern of questioning her worth when others pulled away or did not reciprocate the same level of care and effort she offered.  Therapist used supportive and insight-oriented interventions to help the patient identify themes across these experiences, including seeking validation from people who were emotionally unavailable and taking on responsibility for how relationships turn out. Therapist encouraged the patient to become more self-aware of what she truly wants to do in life, what kind of person she is working to become, and what she values in relationships. We explored the idea that once she is clearer about her own identity, goals, and values, she will be better able to recognize and connect with people who are more aligned with her, rather than repeatedly trying to fit in where she is not fully accepted. Therapist normalized that people have different mindsets and capacities, and emphasized the importance of finding like-minded people instead of trying to force herself into spaces that feel rejecting or draining.  Patient was receptive to this reframe and verbalized understanding that she needs to focus more on her own growth and direction so she can choose relationships more intentionally. She agreed to spend time before the next session journaling about who she wants  to be, what she wants her life to look like in the next few years, and what qualities she wants in the  people around her. No suicidal ideation, homicidal ideation, or acute safety concerns were reported or observed. Symptoms related to self-worth, relationship stress, and identity continue to support the need for ongoing outpatient psychotherapy. Plan is to continue weekly or biweekly sessions focused on self-awareness, boundaries, and relationship patterns, and to review her reflections and journaling at the next appointment. Patient was reminded to seek emergency services or crisis support if safety concerns arise between sessions.    Patient may benefit from Ongoing OPT.   Plan: Follow up with behavioral health clinician on : 08/19/24 In-Person at 1:30 Pm   Behavioral recommendations:  No safety concerns were reported or observed. Client remains appropriate for outpatient therapy and is scheduled to return on 08/19/2024 at 1:30 pm.   Renda Pontes, MSW, LCSW-A She/Her Behavioral Health Clinician [facility]  Internal Medicine Center Main Office Phone: [phone] [address] [place], [place] [address] Website: [facility]  [place], [place]  [facility]

## 2024-08-07 NOTE — Patient Instructions (Addendum)
 Visit Information  Thank you for taking time to visit with me today. Please don't hesitate to contact me if I can be of assistance to you.     Patient will make a values + future self list (who she wants to be in 1-3 years, what matters most, and what kind of people she wants around her) to build clarity before pursuing new relationships.  Patient will practice noticing red flags early (feeling small, walking on eggshells, changing herself to be accepted) and write down at least one boundary she could set in each situation.  Patient will look for like-minded spaces (supportive groups, hobbies, community or online spaces) that align with her values instead of chasing acceptance from people who are dismissive or critical.

## 2024-08-12 ENCOUNTER — Ambulatory Visit: Payer: Self-pay | Admitting: Neurology

## 2024-08-12 NOTE — Procedures (Signed)
" ° ° °  History:  29 year old woman with epilepsy   EEG classification: Awake and drowsy  Duration: 27 minutes   Technical aspects: This EEG study was done with scalp electrodes positioned according to the 10-20 International system of electrode placement. Electrical activity was reviewed with band pass filter of 1-70Hz , sensitivity of 7 uV/mm, display speed of 62mm/sec with a 60Hz  notched filter applied as appropriate. EEG data were recorded continuously and digitally stored.   Description of the recording: The background rhythms of this recording consists of a fairly well modulated medium amplitude alpha rhythm of 12 Hz that is reactive to eye opening and closure. Present in the anterior head region is a 15-20 Hz beta activity. Photic stimulation was performed, did not show any abnormalities. Hyperventilation was also performed, did not show any abnormalities. Drowsiness was manifested by background fragmentation. No abnormal epileptiform discharges seen during this recording. There was no focal slowing. There were no electrographic seizure identified.   Abnormality: None   Impression: This is a normal awake and drowsy EEG. No evidence of interictal epileptiform discharges. Normal EEGs, however, do not rule out epilepsy.    Pastor Falling, MD [facility]  "

## 2024-08-13 NOTE — Telephone Encounter (Signed)
 I returned the patient's call. She did accept my call but has asked specifically for Dr Gregg to call her back so the plan of care can be discussed and there is no miscommunication. She states she understands her daytime EEG was normal and that a normal EEG does not rule out epilepsy. However she states that her seizures have occurred while sleeping. She wants to know what the next steps are. She also said since the EEG, her eyes have had difficulty adjusting to the lighting changes between inside and outside. For example, she doesn't adjust quickly when she walks inside. Going outside may too be bright and cause her to have to go back inside. Pt's other questions regarding where her information was being relayed (pcp) was answered and she thanked me for the call.

## 2024-08-13 NOTE — Telephone Encounter (Signed)
 Yes, please and let her know that having a normal EEG does not mean she does not have epilepsy.

## 2024-08-14 NOTE — Telephone Encounter (Signed)
 Spoke with patient, all questions answered. Will continue with Keppra  1000 mg nightly and patient will call us  with questions or concerns in the future.

## 2024-08-14 NOTE — Telephone Encounter (Signed)
 Noted thank you

## 2024-08-19 ENCOUNTER — Ambulatory Visit: Payer: Self-pay | Admitting: Licensed Clinical Social Worker

## 2024-08-21 ENCOUNTER — Telehealth: Payer: Self-pay | Admitting: Neurology

## 2024-08-21 NOTE — Telephone Encounter (Signed)
 Pt has called and r/s her appointment

## 2024-08-21 NOTE — Telephone Encounter (Signed)
 [facility] cxl

## 2024-08-26 ENCOUNTER — Ambulatory Visit: Admitting: Licensed Clinical Social Worker

## 2025-02-24 ENCOUNTER — Ambulatory Visit: Admitting: Neurology

## 2025-03-11 ENCOUNTER — Ambulatory Visit: Admitting: Neurology
